# Patient Record
Sex: Female | Born: 1950 | Race: Black or African American | Hispanic: No | Marital: Married | Smoking: Former smoker
Health system: Southern US, Community
[De-identification: ages and names within clinical notes are randomized; demographics above are authoritative.]

## PROBLEM LIST (undated history)

## (undated) DIAGNOSIS — IMO0002 Reserved for concepts with insufficient information to code with codable children: Secondary | ICD-10-CM

## (undated) DIAGNOSIS — J302 Other seasonal allergic rhinitis: Secondary | ICD-10-CM

## (undated) DIAGNOSIS — I10 Essential (primary) hypertension: Secondary | ICD-10-CM

## (undated) DIAGNOSIS — Z5189 Encounter for other specified aftercare: Secondary | ICD-10-CM

## (undated) DIAGNOSIS — F419 Anxiety disorder, unspecified: Secondary | ICD-10-CM

## (undated) DIAGNOSIS — C801 Malignant (primary) neoplasm, unspecified: Secondary | ICD-10-CM

## (undated) DIAGNOSIS — O021 Missed abortion: Secondary | ICD-10-CM

## (undated) HISTORY — PX: OTHER SURGICAL HISTORY: SHX169

## (undated) HISTORY — PX: WISDOM TOOTH EXTRACTION: SHX21

## (undated) HISTORY — DX: Malignant (primary) neoplasm, unspecified: C80.1

## (undated) HISTORY — PX: CHOLECYSTECTOMY: SHX55

## (undated) HISTORY — PX: BREAST SURGERY: SHX581

## (undated) HISTORY — PX: DILATION AND CURETTAGE OF UTERUS: SHX78

---

## 1977-10-30 DIAGNOSIS — IMO0001 Reserved for inherently not codable concepts without codable children: Secondary | ICD-10-CM

## 1977-10-30 DIAGNOSIS — Z5189 Encounter for other specified aftercare: Secondary | ICD-10-CM

## 1977-10-30 HISTORY — DX: Reserved for inherently not codable concepts without codable children: IMO0001

## 1977-10-30 HISTORY — DX: Encounter for other specified aftercare: Z51.89

## 1997-12-03 ENCOUNTER — Ambulatory Visit (HOSPITAL_COMMUNITY): Admission: RE | Admit: 1997-12-03 | Discharge: 1997-12-03 | Payer: Self-pay | Admitting: Obstetrics and Gynecology

## 1998-12-29 ENCOUNTER — Ambulatory Visit (HOSPITAL_COMMUNITY): Admission: RE | Admit: 1998-12-29 | Discharge: 1998-12-29 | Payer: Self-pay | Admitting: Obstetrics and Gynecology

## 1998-12-29 ENCOUNTER — Encounter: Payer: Self-pay | Admitting: Obstetrics and Gynecology

## 1999-02-24 ENCOUNTER — Other Ambulatory Visit: Admission: RE | Admit: 1999-02-24 | Discharge: 1999-02-24 | Payer: Self-pay | Admitting: Obstetrics and Gynecology

## 2000-01-02 ENCOUNTER — Encounter: Payer: Self-pay | Admitting: Obstetrics and Gynecology

## 2000-01-02 ENCOUNTER — Ambulatory Visit (HOSPITAL_COMMUNITY): Admission: RE | Admit: 2000-01-02 | Discharge: 2000-01-02 | Payer: Self-pay | Admitting: Obstetrics and Gynecology

## 2000-02-28 ENCOUNTER — Other Ambulatory Visit: Admission: RE | Admit: 2000-02-28 | Discharge: 2000-02-28 | Payer: Self-pay | Admitting: Obstetrics and Gynecology

## 2001-01-24 ENCOUNTER — Ambulatory Visit (HOSPITAL_COMMUNITY): Admission: RE | Admit: 2001-01-24 | Discharge: 2001-01-24 | Payer: Self-pay | Admitting: Obstetrics and Gynecology

## 2001-01-24 ENCOUNTER — Encounter: Payer: Self-pay | Admitting: Obstetrics and Gynecology

## 2001-03-01 ENCOUNTER — Other Ambulatory Visit: Admission: RE | Admit: 2001-03-01 | Discharge: 2001-03-01 | Payer: Self-pay | Admitting: Obstetrics and Gynecology

## 2002-01-31 ENCOUNTER — Ambulatory Visit (HOSPITAL_COMMUNITY): Admission: RE | Admit: 2002-01-31 | Discharge: 2002-01-31 | Payer: Self-pay | Admitting: Gynecology

## 2002-01-31 ENCOUNTER — Encounter: Payer: Self-pay | Admitting: Obstetrics and Gynecology

## 2002-03-11 ENCOUNTER — Other Ambulatory Visit: Admission: RE | Admit: 2002-03-11 | Discharge: 2002-03-11 | Payer: Self-pay | Admitting: Obstetrics and Gynecology

## 2003-04-17 ENCOUNTER — Encounter: Payer: Self-pay | Admitting: Obstetrics and Gynecology

## 2003-04-17 ENCOUNTER — Encounter: Admission: RE | Admit: 2003-04-17 | Discharge: 2003-04-17 | Payer: Self-pay | Admitting: Obstetrics and Gynecology

## 2003-12-19 ENCOUNTER — Encounter: Admission: RE | Admit: 2003-12-19 | Discharge: 2003-12-19 | Payer: Self-pay | Admitting: Orthopedic Surgery

## 2004-07-08 ENCOUNTER — Ambulatory Visit (HOSPITAL_COMMUNITY): Admission: RE | Admit: 2004-07-08 | Discharge: 2004-07-08 | Payer: Self-pay | Admitting: Obstetrics and Gynecology

## 2005-08-22 ENCOUNTER — Ambulatory Visit (HOSPITAL_COMMUNITY): Admission: RE | Admit: 2005-08-22 | Discharge: 2005-08-22 | Payer: Self-pay | Admitting: Obstetrics and Gynecology

## 2006-08-23 ENCOUNTER — Ambulatory Visit (HOSPITAL_COMMUNITY): Admission: RE | Admit: 2006-08-23 | Discharge: 2006-08-23 | Payer: Self-pay | Admitting: Obstetrics & Gynecology

## 2007-08-27 ENCOUNTER — Ambulatory Visit (HOSPITAL_COMMUNITY): Admission: RE | Admit: 2007-08-27 | Discharge: 2007-08-27 | Payer: Self-pay | Admitting: Obstetrics & Gynecology

## 2007-09-05 ENCOUNTER — Encounter: Admission: RE | Admit: 2007-09-05 | Discharge: 2007-09-05 | Payer: Self-pay | Admitting: Obstetrics & Gynecology

## 2007-09-12 ENCOUNTER — Encounter (INDEPENDENT_AMBULATORY_CARE_PROVIDER_SITE_OTHER): Payer: Self-pay | Admitting: Obstetrics & Gynecology

## 2007-09-12 ENCOUNTER — Encounter: Admission: RE | Admit: 2007-09-12 | Discharge: 2007-09-12 | Payer: Self-pay | Admitting: Obstetrics & Gynecology

## 2007-09-12 ENCOUNTER — Encounter (INDEPENDENT_AMBULATORY_CARE_PROVIDER_SITE_OTHER): Payer: Self-pay | Admitting: Radiology

## 2007-09-20 ENCOUNTER — Encounter: Admission: RE | Admit: 2007-09-20 | Discharge: 2007-09-20 | Payer: Self-pay | Admitting: Obstetrics & Gynecology

## 2007-10-02 ENCOUNTER — Encounter: Admission: RE | Admit: 2007-10-02 | Discharge: 2007-10-02 | Payer: Self-pay | Admitting: General Surgery

## 2007-10-03 ENCOUNTER — Encounter: Admission: RE | Admit: 2007-10-03 | Discharge: 2007-10-03 | Payer: Self-pay | Admitting: General Surgery

## 2007-10-03 ENCOUNTER — Encounter (INDEPENDENT_AMBULATORY_CARE_PROVIDER_SITE_OTHER): Payer: Self-pay | Admitting: General Surgery

## 2007-10-03 ENCOUNTER — Ambulatory Visit (HOSPITAL_BASED_OUTPATIENT_CLINIC_OR_DEPARTMENT_OTHER): Admission: RE | Admit: 2007-10-03 | Discharge: 2007-10-03 | Payer: Self-pay | Admitting: General Surgery

## 2007-11-06 ENCOUNTER — Encounter (INDEPENDENT_AMBULATORY_CARE_PROVIDER_SITE_OTHER): Payer: Self-pay | Admitting: General Surgery

## 2007-11-06 ENCOUNTER — Ambulatory Visit (HOSPITAL_BASED_OUTPATIENT_CLINIC_OR_DEPARTMENT_OTHER): Admission: RE | Admit: 2007-11-06 | Discharge: 2007-11-06 | Payer: Self-pay | Admitting: General Surgery

## 2007-11-13 ENCOUNTER — Ambulatory Visit: Payer: Self-pay | Admitting: Oncology

## 2007-11-22 ENCOUNTER — Ambulatory Visit: Admission: RE | Admit: 2007-11-22 | Discharge: 2008-02-13 | Payer: Self-pay | Admitting: Radiation Oncology

## 2007-11-27 ENCOUNTER — Encounter: Admission: RE | Admit: 2007-11-27 | Discharge: 2007-11-27 | Payer: Self-pay | Admitting: Radiation Oncology

## 2007-12-02 LAB — COMPREHENSIVE METABOLIC PANEL
ALT: 22 U/L (ref 0–35)
AST: 16 U/L (ref 0–37)
Calcium: 9.4 mg/dL (ref 8.4–10.5)
Chloride: 102 mEq/L (ref 96–112)
Creatinine, Ser: 0.71 mg/dL (ref 0.40–1.20)
Sodium: 142 mEq/L (ref 135–145)
Total Bilirubin: 0.4 mg/dL (ref 0.3–1.2)

## 2007-12-02 LAB — CBC WITH DIFFERENTIAL/PLATELET
BASO%: 0.6 % (ref 0.0–2.0)
EOS%: 13.8 % — ABNORMAL HIGH (ref 0.0–7.0)
HCT: 38.1 % (ref 34.8–46.6)
MCH: 26.6 pg (ref 26.0–34.0)
MCHC: 33.6 g/dL (ref 32.0–36.0)
NEUT%: 33 % — ABNORMAL LOW (ref 39.6–76.8)
RBC: 4.8 10*6/uL (ref 3.70–5.32)
RDW: 14.3 % (ref 11.3–14.5)
lymph#: 3.3 10*3/uL (ref 0.9–3.3)

## 2008-01-24 ENCOUNTER — Ambulatory Visit: Payer: Self-pay | Admitting: Oncology

## 2008-05-25 IMAGING — CR DG CHEST 2V
2 series · 2 of 2 positions shown · non-contrast
Comparison: None.

CLINICAL DATA: Preop respiratory exam for left breast surgery.
 CHEST - 2 VIEWS:

[w chest pa]
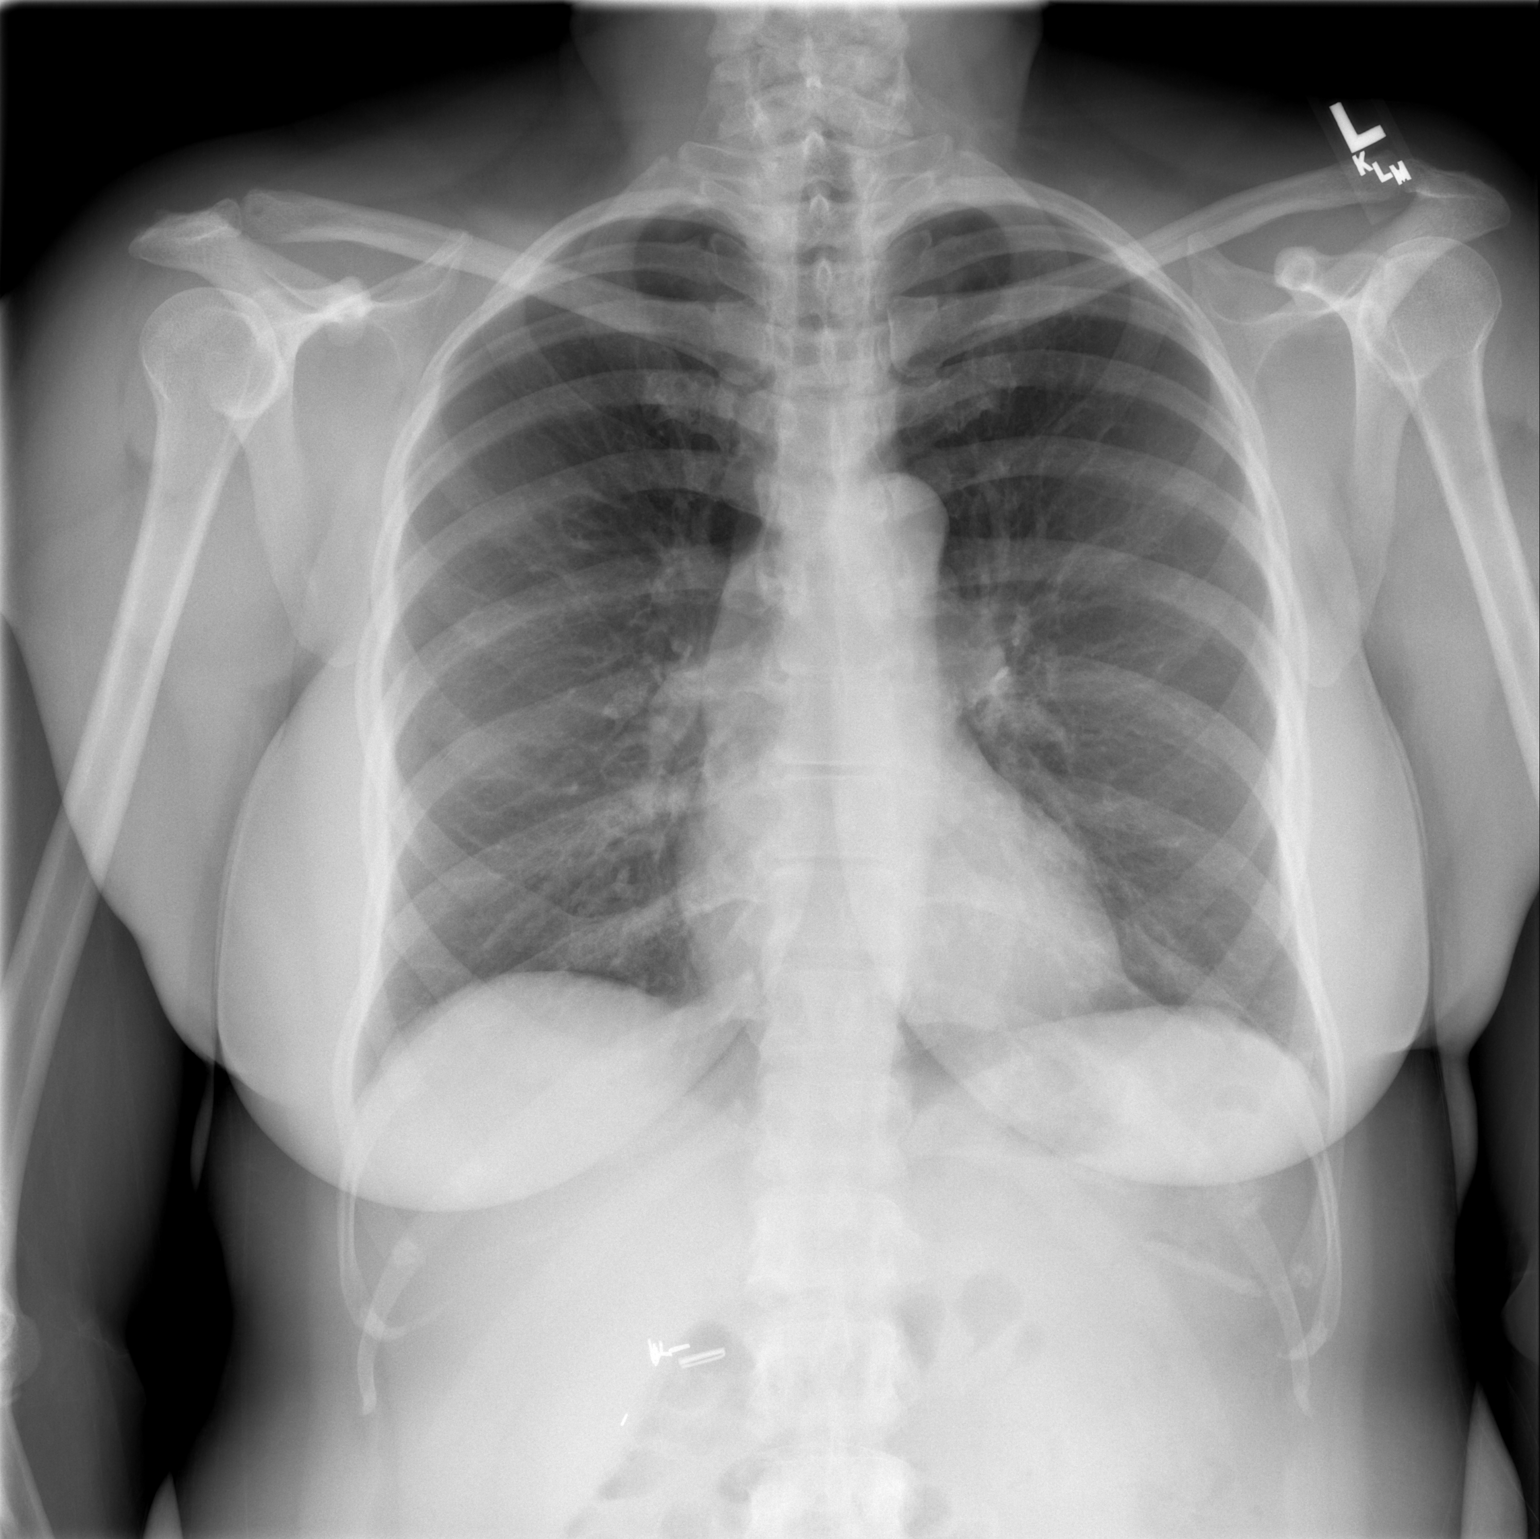

[w chest lat]
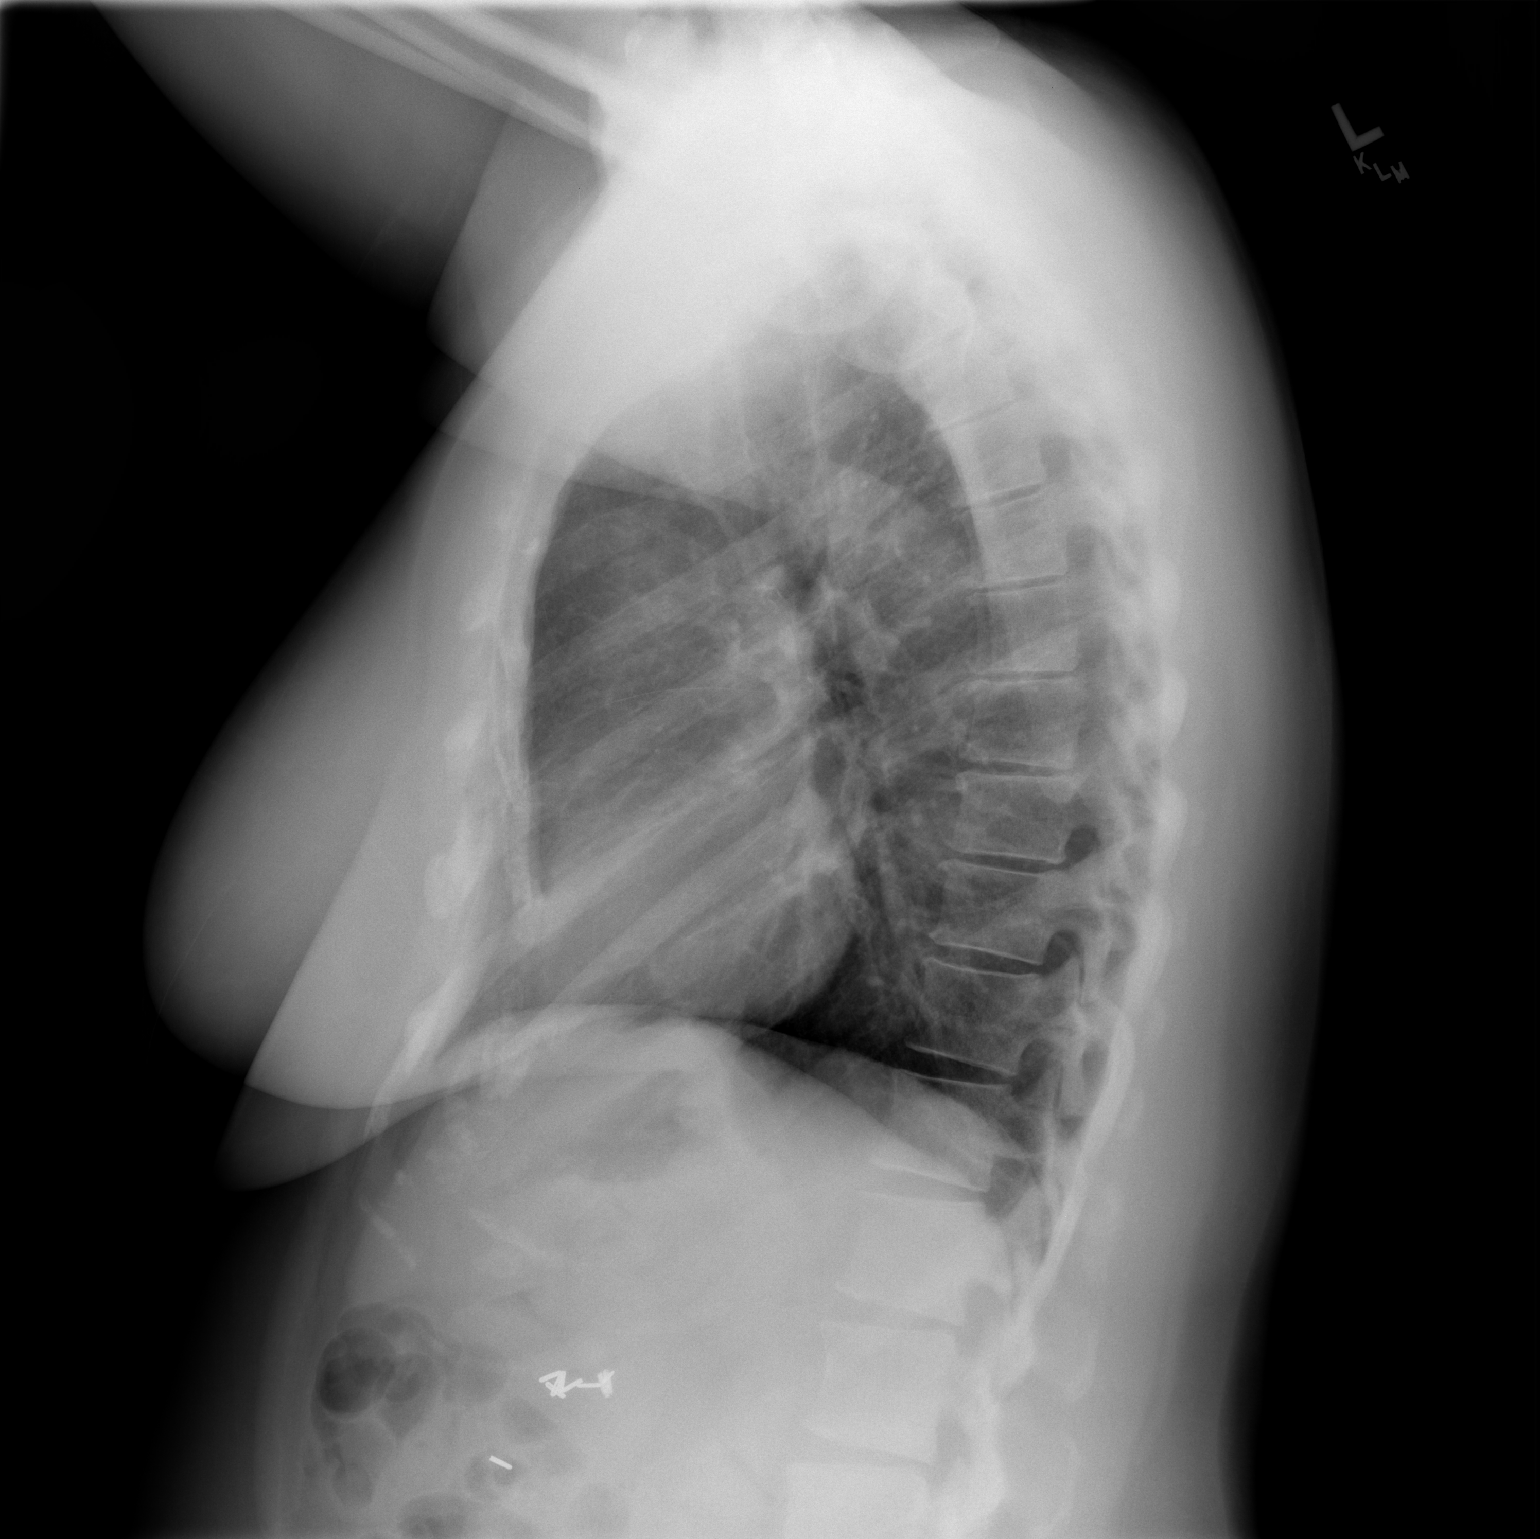

[2 of 2 positions shown; findings below may reference images not displayed]

FINDINGS: Two views of the chest show the lungs to be clear. The heart is within normal limits in size.  Surgical clips are present in the right upper quadrant.
IMPRESSION: No active lung disease.

## 2008-06-05 ENCOUNTER — Ambulatory Visit: Payer: Self-pay | Admitting: Oncology

## 2008-06-09 LAB — CBC WITH DIFFERENTIAL/PLATELET
BASO%: 0.7 % (ref 0.0–2.0)
EOS%: 9.6 % — ABNORMAL HIGH (ref 0.0–7.0)
HCT: 39.8 % (ref 34.8–46.6)
LYMPH%: 25.7 % (ref 14.0–48.0)
MCH: 26.8 pg (ref 26.0–34.0)
MCHC: 33.2 g/dL (ref 32.0–36.0)
MCV: 80.7 fL — ABNORMAL LOW (ref 81.0–101.0)
MONO%: 12.9 % (ref 0.0–13.0)
NEUT%: 51.1 % (ref 39.6–76.8)
Platelets: 243 10*3/uL (ref 145–400)

## 2008-09-07 ENCOUNTER — Encounter: Admission: RE | Admit: 2008-09-07 | Discharge: 2008-09-07 | Payer: Self-pay | Admitting: Oncology

## 2008-12-30 ENCOUNTER — Ambulatory Visit: Payer: Self-pay | Admitting: Oncology

## 2009-01-01 LAB — COMPREHENSIVE METABOLIC PANEL
Albumin: 3.5 g/dL (ref 3.5–5.2)
BUN: 11 mg/dL (ref 6–23)
CO2: 29 mEq/L (ref 19–32)
Calcium: 9.3 mg/dL (ref 8.4–10.5)
Chloride: 107 mEq/L (ref 96–112)
Creatinine, Ser: 0.84 mg/dL (ref 0.40–1.20)
Glucose, Bld: 166 mg/dL — ABNORMAL HIGH (ref 70–99)
Potassium: 3.7 mEq/L (ref 3.5–5.3)

## 2009-01-01 LAB — CBC WITH DIFFERENTIAL/PLATELET
Eosinophils Absolute: 0.8 10*3/uL — ABNORMAL HIGH (ref 0.0–0.5)
HCT: 37.3 % (ref 34.8–46.6)
LYMPH%: 37.1 % (ref 14.0–49.7)
MCV: 76.9 fL — ABNORMAL LOW (ref 79.5–101.0)
MONO#: 0.4 10*3/uL (ref 0.1–0.9)
MONO%: 7.6 % (ref 0.0–14.0)
NEUT#: 2.3 10*3/uL (ref 1.5–6.5)
NEUT%: 40.4 % (ref 38.4–76.8)
Platelets: 240 10*3/uL (ref 145–400)
RBC: 4.85 10*6/uL (ref 3.70–5.45)
WBC: 5.6 10*3/uL (ref 3.9–10.3)
nRBC: 0 % (ref 0–0)

## 2009-07-09 ENCOUNTER — Ambulatory Visit: Payer: Self-pay | Admitting: Oncology

## 2009-07-13 LAB — CBC WITH DIFFERENTIAL/PLATELET
Basophils Absolute: 0 10*3/uL (ref 0.0–0.1)
EOS%: 9.2 % — ABNORMAL HIGH (ref 0.0–7.0)
Eosinophils Absolute: 0.6 10*3/uL — ABNORMAL HIGH (ref 0.0–0.5)
HCT: 36.7 % (ref 34.8–46.6)
HGB: 12.5 g/dL (ref 11.6–15.9)
MCH: 26.7 pg (ref 25.1–34.0)
MCV: 78.4 fL — ABNORMAL LOW (ref 79.5–101.0)
MONO%: 8.1 % (ref 0.0–14.0)
NEUT#: 3.1 10*3/uL (ref 1.5–6.5)
NEUT%: 46.2 % (ref 38.4–76.8)
lymph#: 2.5 10*3/uL (ref 0.9–3.3)

## 2009-09-10 ENCOUNTER — Encounter: Admission: RE | Admit: 2009-09-10 | Discharge: 2009-09-10 | Payer: Self-pay | Admitting: Oncology

## 2010-01-07 ENCOUNTER — Ambulatory Visit: Payer: Self-pay | Admitting: Oncology

## 2010-07-11 ENCOUNTER — Ambulatory Visit: Payer: Self-pay | Admitting: Oncology

## 2010-09-06 ENCOUNTER — Ambulatory Visit: Payer: Self-pay | Admitting: Oncology

## 2010-09-08 LAB — CBC WITH DIFFERENTIAL/PLATELET
Eosinophils Absolute: 0.9 10*3/uL — ABNORMAL HIGH (ref 0.0–0.5)
HCT: 37.5 % (ref 34.8–46.6)
LYMPH%: 44.5 % (ref 14.0–49.7)
MONO#: 0.7 10*3/uL (ref 0.1–0.9)
NEUT#: 1.8 10*3/uL (ref 1.5–6.5)
NEUT%: 29 % — ABNORMAL LOW (ref 38.4–76.8)
Platelets: 257 10*3/uL (ref 145–400)
WBC: 6.2 10*3/uL (ref 3.9–10.3)

## 2010-09-09 LAB — COMPREHENSIVE METABOLIC PANEL
CO2: 28 mEq/L (ref 19–32)
Creatinine, Ser: 1 mg/dL (ref 0.40–1.20)
Glucose, Bld: 81 mg/dL (ref 70–99)
Total Bilirubin: 0.4 mg/dL (ref 0.3–1.2)
Total Protein: 7 g/dL (ref 6.0–8.3)

## 2010-09-09 LAB — VITAMIN D 25 HYDROXY (VIT D DEFICIENCY, FRACTURES): Vit D, 25-Hydroxy: 51 ng/mL (ref 30–89)

## 2010-09-09 LAB — LACTATE DEHYDROGENASE: LDH: 167 U/L (ref 94–250)

## 2010-09-12 ENCOUNTER — Encounter: Admission: RE | Admit: 2010-09-12 | Discharge: 2010-09-12 | Payer: Self-pay | Admitting: Oncology

## 2010-11-19 ENCOUNTER — Other Ambulatory Visit: Payer: Self-pay | Admitting: Oncology

## 2010-11-19 DIAGNOSIS — Z9889 Other specified postprocedural states: Secondary | ICD-10-CM

## 2010-11-20 ENCOUNTER — Encounter: Payer: Self-pay | Admitting: Obstetrics and Gynecology

## 2010-11-20 ENCOUNTER — Encounter: Payer: Self-pay | Admitting: Obstetrics & Gynecology

## 2011-03-14 NOTE — Op Note (Signed)
NAMEKRISTIN, [name]          ACCOUNT NO.:  [id number]   MEDICAL RECORD NO.:  [id number]          PATIENT TYPE:  AMB   LOCATION:  DSC                          FACILITY:  [facility]   PHYSICIAN:  Timothy E. Earlene Plater, M.D. DATE OF BIRTH:  1950-11-03   DATE OF PROCEDURE:  DATE OF DISCHARGE:                               OPERATIVE REPORT   PREOPERATIVE DIAGNOSIS:  Ductal carcinoma in situ left breast.   POSTOPERATIVE DIAGNOSIS:  Ductal carcinoma in situ left breast.   PROCEDURES:  Needle-localized wide excision area of DCIS left breast.   SURGEON:  Kendrick Ranch   ANESTHESIA:  Local standby.   Toni Williams is healthy, 64.  Mammograms reveal tiny area of  calcifications.  Stereotactic biopsies were done showing DCIS and  multiple calcifications had been removed.  She was informed and  scheduled for a needle-localized complete excision; she agrees and  understands.  She was seen identified today and the left breast marked.  She was taken to the operating room, IV sedation given.  The left breast  was exposed, the wire was cut to length, breast was prepped and draped  in the usual fashion.  Marcaine 1/4% with epinephrine was used  throughout for local anesthesia.  The wire entered the breast medially  and the wire pierced the area of calcification and clip, an incision was  made at the wire entrance, the section was carried down around the wire  with a generous margin, the only palpable abnormal tissue was at the tip  of the wire which was also excised.  So, the tissue around the wire were  placed in the canister for mammography and the additional tissue  removed, both from around the wire and around the tip of the wire were  also placed in that container.  That was sent for specimen mammogram.  Meanwhile, bleeding was controlled with a cautery and the wound was  closed in layers with Steri-Strips.  X-ray called back and said that the  wire, the clip and the calcifications were included.   Counts correct,  she tolerated it well, she was removed to recovery room in good  condition.  Written and verbal instructions given and she will be seen  and followed as an outpatient.      Timothy E. Earlene Plater, M.D.  Electronically Signed     TED/MEDQ  D:  10/03/2007  T:  10/03/2007  Job:  [id number]   cc:   [facility]

## 2011-03-14 NOTE — Op Note (Signed)
NAMEKRYSTAL, TEACHEY          ACCOUNT NO.:  [id number]   MEDICAL RECORD NO.:  [id number]           PATIENT TYPE:   LOCATION:                                 FACILITY:   PHYSICIAN:  Timothy E. Earlene Plater, M.D. DATE OF BIRTH:  1951-05-15   DATE OF PROCEDURE:  11/06/2007  DATE OF DISCHARGE:                               OPERATIVE REPORT   PREOP DIAGNOSIS:  Ductal carcinoma in situ left breast with positive  margins.   POSTOPERATIVE DIAGNOSIS:  Ductal carcinoma in situ left breast with  positive margins.   PROCEDURE:  Re-excision of area of left breast.   SURGEON:  Timothy E. Earlene Plater, M.D.   ANESTHESIA:  Local standby.   HISTORY:  Ms. Sandra is an otherwise healthy 60 year old, had an  abnormal mammogram, core biopsy, DCIS, and a needle directed excisional  biopsy done in mid-November.  The margins were positive.  She has been  carefully counseled, and has picked, at this time, to proceed with  reexcision.  The patient was informed; she signed the permit, and the  left breast was marked.   She was taken to the operating room, placed supine, IV sedation given.  The left breast was prepped and draped in the usual fashion.  Marcaine  1/4% with epinephrine was used throughout for a wide local excision.  The skin was marked over the previous scar, and this scar was sharply  excised.  Beginning in the superficial subcutaneous fat, the entire area  of the prior biopsy was completely removed.  It was carefully marked  with sutures, and as well a wedge of skin on the obvious anterior  margin.  This was quite a large excision, much larger than the original.  No other areas within the cavity were thought to be abnormal.  cautery  was used to control the bleeding, and the wound was dry.   It was approximated with 3-0 Vicryl and 3-0 Monocryl.  Steri-Strips were  applied.  Final counts were correct.  The specimen was sent fresh to lab  to assure proper alignment.  She was removed to the  recovery room in  good condition.  Vicodin #36 given.  She will be seen and followed as an  outpatient.      Timothy E. Earlene Plater, M.D.  Electronically Signed     TED/MEDQ  D:  11/06/2007  T:  11/06/2007  Job:  [id number]

## 2011-04-03 ENCOUNTER — Ambulatory Visit (INDEPENDENT_AMBULATORY_CARE_PROVIDER_SITE_OTHER): Payer: BC Managed Care – PPO | Admitting: Family Medicine

## 2011-04-03 ENCOUNTER — Encounter: Payer: Self-pay | Admitting: Family Medicine

## 2011-04-03 DIAGNOSIS — I10 Essential (primary) hypertension: Secondary | ICD-10-CM

## 2011-04-03 NOTE — Progress Notes (Signed)
Medical Nutrition Therapy:  Appt start time: 1130 end time: 1230.  Assessment:  Primary concerns today: Weight management and hypertension, referred by Dr. Elvina Sidle.  Usual eating pattern includes 3 meals and 1-2 snacks per day.  Everyday foods include decaf coffee w/ flavored nondairy creamer and 1 c fruit juice.  Avoided foods include soda.  24-hr recall suggests an intake of 1800-1900 kcal: B (8:30 AM)- 1 c Special K w/ berries w/ 1/2 c 2% milk; L (1 PM)- [employer]: 2 [place] sausage patties, 2 scrmbled eggs, 1 c hash browns, 2 slc toast w/ butter & jam, 2 c decaf coffee w/ total of 3 tsp sugar; D (7 PM)- 1 fried pork chop, 3/4 c mixed veg's, 3/4 c rice, 1 c juice.  A more typical lunch is a sandwich.  Usual physical activity includes none.  She hopes to start Zumba on tapes at home.  Ms. Stanfill works at [employer], which is a large building that would accommodate walking for exercise.  Walking outside is not an option b/c of allergies.    Progress Towards Goal(s):  In progress.   Nutritional Diagnosis:  NB-2.1 Physical inactivity As related to poor motivation.  As evidenced by no current exercise. NI-5.8.3 Inappropriate intake of types of carbohydrates (specify):  As related to fruits and vegetables.  As evidenced by only one serving of veg's on most days, and fruit often limited to juice.    Intervention:  Nutrition education.  Monitoring/Evaluation:  Dietary intake, exercise, and body weight in 4 week(s).

## 2011-04-03 NOTE — Patient Instructions (Addendum)
-   Physical activity goal:  At least 30 min 4 X wk of Zumba at home or walking.  (Consider walking some days at work.) - At work or at any task where you are seated for a long time, make an effort to move around as much as you can.  Google "Non-exercise activity thermogenesis" (NEAT) and Georgeann Oppenheim, MD.   - Obtain twice as many veg's as protein or carbohydrate foods for both lunch and dinner.  Vegetables are crucial to wt management AND to controlling blood pressure AND to appetite management. - Breakfast:  Try some high-fiber cereals (more than 5 grams per serving, i.e., Shredded Wheat 'n Bran, any of the bran cereals, Frosted Mini-wheats, Wheat Chex), and see if they help you get thru the morning without a snack.   - Snacks:  Fruit, string cheese, small handful of unsalted nuts/seeds. - PLANNING AHEAD will make the difference of these things happening or not.     - When choosing processed carbohydrate foods (breads, any baked foods, crackers, bagels, pretzels), choose those with the most fiber (and less sugar).  - Check with BCBS:  Ask if follow-up appts are covered for MEDICAL NUTRITION THERAPY for hypertension.  If you want to schedule another appt, call Dr. Gerilyn Pilgrim at [phone].

## 2011-05-18 ENCOUNTER — Other Ambulatory Visit: Payer: Self-pay | Admitting: Obstetrics and Gynecology

## 2011-05-26 ENCOUNTER — Other Ambulatory Visit: Payer: Self-pay | Admitting: Obstetrics and Gynecology

## 2011-05-29 ENCOUNTER — Other Ambulatory Visit: Payer: Self-pay | Admitting: Obstetrics and Gynecology

## 2011-07-20 LAB — BASIC METABOLIC PANEL
BUN: 10
CO2: 29
Chloride: 99
Creatinine, Ser: 0.81
Glucose, Bld: 89

## 2011-08-07 LAB — COMPREHENSIVE METABOLIC PANEL
AST: 21
Albumin: 3.7
CO2: 31
Calcium: 9.6
Creatinine, Ser: 0.71
GFR calc Af Amer: >60
GFR calc non Af Amer: >60

## 2011-08-07 LAB — CBC
MCHC: 33.2
MCV: 79.6
Platelets: 310

## 2011-08-07 LAB — DIFFERENTIAL
Eosinophils Relative: 10 — ABNORMAL HIGH
Lymphocytes Relative: 38
Lymphs Abs: 2.6

## 2011-09-11 ENCOUNTER — Other Ambulatory Visit: Payer: BC Managed Care – PPO | Admitting: Lab

## 2011-09-13 ENCOUNTER — Telehealth: Payer: Self-pay | Admitting: *Deleted

## 2011-09-13 NOTE — Telephone Encounter (Signed)
RESCHEDULED PATIENT FOR MISSED LAB APPOINTMENT FOR 09-14-2011 AT 8:00AM

## 2011-09-14 ENCOUNTER — Other Ambulatory Visit: Payer: Self-pay | Admitting: Oncology

## 2011-09-14 ENCOUNTER — Other Ambulatory Visit (HOSPITAL_BASED_OUTPATIENT_CLINIC_OR_DEPARTMENT_OTHER): Payer: BC Managed Care – PPO | Admitting: Lab

## 2011-09-14 DIAGNOSIS — Z170 Estrogen receptor positive status [ER+]: Secondary | ICD-10-CM

## 2011-09-14 DIAGNOSIS — D0590 Unspecified type of carcinoma in situ of unspecified breast: Secondary | ICD-10-CM

## 2011-09-14 LAB — CBC WITH DIFFERENTIAL/PLATELET
Basophils Absolute: 0 10*3/uL (ref 0.0–0.1)
Eosinophils Absolute: 0.8 10*3/uL — ABNORMAL HIGH (ref 0.0–0.5)
HCT: 37.5 % (ref 34.8–46.6)
HGB: 12.7 g/dL (ref 11.6–15.9)
LYMPH%: 48 % (ref 14.0–49.7)
MCHC: 33.9 g/dL (ref 31.5–36.0)
MONO#: 0.6 10*3/uL (ref 0.1–0.9)
NEUT#: 1.9 10*3/uL (ref 1.5–6.5)
NEUT%: 29.6 % — ABNORMAL LOW (ref 38.4–76.8)
Platelets: 260 10*3/uL (ref 145–400)
WBC: 6.3 10*3/uL (ref 3.9–10.3)
lymph#: 3 10*3/uL (ref 0.9–3.3)

## 2011-09-14 LAB — COMPREHENSIVE METABOLIC PANEL
ALT: 32 U/L (ref 0–35)
BUN: 11 mg/dL (ref 6–23)
CO2: 24 mEq/L (ref 19–32)
Calcium: 9.6 mg/dL (ref 8.4–10.5)
Chloride: 107 mEq/L (ref 96–112)
Creatinine, Ser: 0.74 mg/dL (ref 0.50–1.10)
Glucose, Bld: 105 mg/dL — ABNORMAL HIGH (ref 70–99)
Total Bilirubin: 0.4 mg/dL (ref 0.3–1.2)

## 2011-09-14 LAB — VITAMIN D 25 HYDROXY (VIT D DEFICIENCY, FRACTURES): Vit D, 25-Hydroxy: 40 ng/mL (ref 30–89)

## 2011-09-19 ENCOUNTER — Telehealth: Payer: Self-pay | Admitting: Oncology

## 2011-09-19 ENCOUNTER — Ambulatory Visit (HOSPITAL_BASED_OUTPATIENT_CLINIC_OR_DEPARTMENT_OTHER): Payer: BC Managed Care – PPO | Admitting: Oncology

## 2011-09-19 DIAGNOSIS — Z170 Estrogen receptor positive status [ER+]: Secondary | ICD-10-CM

## 2011-09-19 DIAGNOSIS — D0510 Intraductal carcinoma in situ of unspecified breast: Secondary | ICD-10-CM

## 2011-09-19 DIAGNOSIS — D0590 Unspecified type of carcinoma in situ of unspecified breast: Secondary | ICD-10-CM

## 2011-09-19 DIAGNOSIS — N840 Polyp of corpus uteri: Secondary | ICD-10-CM

## 2011-09-19 DIAGNOSIS — E559 Vitamin D deficiency, unspecified: Secondary | ICD-10-CM

## 2011-09-19 NOTE — Progress Notes (Signed)
Hematology and Oncology Follow Up Visit  Toni Williams [id number] August 28, 1951 60 y.o. 09/19/2011 10:54 AM   Principle Diagnosis: DCIS status post lumpectomy 10/01/2007, status post radiation therapy completed 02/03/2008, ER positive PR positive on tamoxifen here for one-year followup visit  Interim History:  The patient is doing without complaint. She continues on tamoxifen. She did have recent a  biopsy of her uterine lining which was negative though a polyp was seen and she is therefore due for a D+C.  Medications: I have reviewed the patient's current medications.  Allergies: No Known Allergies  Past Medical History, Surgical history, Social history, and Family History were reviewed and updated.  Review of Systems: Constitutional:  Negative for fever, chills, night sweats, anorexia, weight loss, pain. Cardiovascular: no chest pain or dyspnea on exertion Respiratory: no cough, shortness of breath, or wheezing Neurological: no TIA or stroke symptoms Dermatological: negative ENT: negative Skin Gastrointestinal: no abdominal pain, change in bowel habits, or black or bloody stools Genito-Urinary: no dysuria, trouble voiding, or hematuria Hematological and Lymphatic: negative Breast: negative for breast lumps Musculoskeletal: negative Remaining ROS negative.  Physical Exam: Blood pressure 137/83, pulse 123, temperature 99 F (37.2 C), temperature source Oral, height 5\' 4"  (1.626 m), weight 193 lb 11.2 oz (87.862 kg). ECOG: 0 General appearance: alert, cooperative and appears stated age Head: Normocephalic, without obvious abnormality, atraumatic Neck: no adenopathy, no carotid bruit, no JVD, supple, symmetrical, trachea midline and thyroid not enlarged, symmetric, no tenderness/mass/nodules Lymph nodes: Cervical, supraclavicular, and axillary nodes normal. Cardiac : Normal Pulmonary: Normal Breasts: Bilateral breast exam without evidence of mass, skin change or nipple  retraction, both axilla negative Abdomen: Normal Extremities normal Neuro: Normal  Lab Results: Lab Results  Component Value Date   WBC 6.3 09/14/2011   HGB 12.7 09/14/2011   HCT 37.5 09/14/2011   MCV 78.3* 09/14/2011   PLT 260 09/14/2011     Chemistry      Component Value Date/Time   NA 142 09/14/2011 0805   NA 142 09/14/2011 0805   K 3.9 09/14/2011 0805   K 3.9 09/14/2011 0805   CL 107 09/14/2011 0805   CL 107 09/14/2011 0805   CO2 24 09/14/2011 0805   CO2 24 09/14/2011 0805   BUN 11 09/14/2011 0805   BUN 11 09/14/2011 0805   CREATININE 0.74 09/14/2011 0805   CREATININE 0.74 09/14/2011 0805      Component Value Date/Time   CALCIUM 9.6 09/14/2011 0805   CALCIUM 9.6 09/14/2011 0805   ALKPHOS 48 09/14/2011 0805   ALKPHOS 48 09/14/2011 0805   AST 18 09/14/2011 0805   AST 18 09/14/2011 0805   ALT 32 09/14/2011 0805   ALT 32 09/14/2011 0805   BILITOT 0.4 09/14/2011 0805   BILITOT 0.4 09/14/2011 0805       Radiological Studies: chest X-ray n/a Mammogram Done 11/20- read as normal Bone density n/a  Impression and Plan: History of DCIS with followup radiation completed 3-1/2 years ago tolerating tamoxifen. Due for the D. and C.. we'll see Toni Williams in 1 years time with mammogram and bone density test.  More than 50% of the visit was spent in patient-related counselling   Pierce Crane, MD 11/20/201210:54 AM

## 2011-09-19 NOTE — Telephone Encounter (Signed)
Gv pt appt for nov2013.  scheduled pt for mammogram and bone density for 09/19/2012 @ 9am @ [facility]

## 2011-10-13 ENCOUNTER — Encounter (HOSPITAL_COMMUNITY): Payer: Self-pay | Admitting: Pharmacist

## 2011-10-17 NOTE — Patient Instructions (Addendum)
   Your procedure is scheduled on: Friday, Dec 28th  Enter through the Main Entrance of [facility] at: 8:00am Pick up the phone at the desk and dial [phone] and inform us of your arrival.  Please call this number if you have any problems the morning of surgery: [phone]  Remember: Do not eat food after midnight: Thursday Do not drink clear liquids after: Thursday Take these medicines the morning of surgery with a SIP OF WATER:  Blood pressure med   Do not wear jewelry, make-up, or FINGER nail polish Do not wear lotions, powders, perfumes or deodorant. Do not shave 48 hours prior to surgery. Do not bring valuables to the hospital.  Patients discharged on the day of surgery will not be allowed to drive home.   Home with Husband Loraine Leriche  cell [phone]  Remember to use your hibiclens as instructed.Please shower with 1/2 bottle the evening before your surgery and the other 1/2 bottle the morning of surgery.

## 2011-10-18 ENCOUNTER — Other Ambulatory Visit: Payer: Self-pay

## 2011-10-18 ENCOUNTER — Encounter (HOSPITAL_COMMUNITY)
Admission: RE | Admit: 2011-10-18 | Discharge: 2011-10-18 | Disposition: A | Discharge status: Home / Self Care | Payer: BC Managed Care – PPO | Source: Ambulatory Visit | Attending: Obstetrics and Gynecology | Admitting: Obstetrics and Gynecology

## 2011-10-18 ENCOUNTER — Encounter (HOSPITAL_COMMUNITY): Payer: Self-pay

## 2011-10-18 HISTORY — DX: Reserved for concepts with insufficient information to code with codable children: IMO0002

## 2011-10-18 HISTORY — DX: Encounter for other specified aftercare: Z51.89

## 2011-10-18 HISTORY — DX: Anxiety disorder, unspecified: F41.9

## 2011-10-18 HISTORY — DX: Other seasonal allergic rhinitis: J30.2

## 2011-10-18 HISTORY — DX: Essential (primary) hypertension: I10

## 2011-10-18 HISTORY — DX: Missed abortion: O02.1

## 2011-10-18 LAB — CBC
MCH: 26.8 pg (ref 26.0–34.0)
MCV: 79.9 fL (ref 78.0–100.0)
Platelets: 230 10*3/uL (ref 150–400)
RBC: 4.67 MIL/uL (ref 3.87–5.11)
RDW: 14.6 % (ref 11.5–15.5)

## 2011-10-18 LAB — BASIC METABOLIC PANEL
CO2: 27 mEq/L (ref 19–32)
Calcium: 9.5 mg/dL (ref 8.4–10.5)
Creatinine, Ser: 0.71 mg/dL (ref 0.50–1.10)

## 2011-10-18 NOTE — Pre-Procedure Instructions (Signed)
No Blood Draws/IV sticks/Blood Pressures in patient's Left Arm.  Hx Breast Cancer.

## 2011-10-18 NOTE — Pre-Procedure Instructions (Signed)
Reviewed patient's history with Dr Cristela Blue.  Per Dr Cristela Blue, ok for patient to take HCTZ BP med DOS with small sip of water.  Ok to see patient DOS.

## 2011-10-23 NOTE — H&P (Signed)
NAME:  Toni Williams, Toni Williams               ACCOUNT NO.:  MEDICAL RECORD NO.:  [id number]  LOCATION:                                 FACILITY:  PHYSICIAN:  Osborn Coho, M.D.   DATE OF BIRTH:  1951-05-07  DATE OF ADMISSION: DATE OF DISCHARGE:                             HISTORY & PHYSICAL   HISTORY OF PRESENT ILLNESS:  Toni Williams is a 60 year old married African American female, para 1-0-2-1 presenting for hysteroscopy, D and C because of postmenopausal bleeding and endometrial polyps.  The patient has been menopausal since age 49 and on tamoxifen therapy for the past 4 years without any bleeding episodes.  Approximately 6 months ago, however, the patient had 3 days of vaginal bleeding requiring her to wear pad, but only changed it once a day.  She denies any cramping, changes in her bowel movements, urinary frequency, urgency, dysuria, hematuria, or dyspareunia, though she does have occasional lower back pain.  A pelvic ultrasound in April 2012 showed uterus measuring 12.64 x 8.47 x 6.84 cm with a posterior subserosal fibroid measuring 5.66 x 4.99 x 6.05 cm and a submucosal fibroid measuring 3.31 x 4.57 x 3.76 cm. Both of patient's ovaries appeared normal on that study and her endometrium measured 0.79 cm.  Subsequently, the patient had an endometrial biopsy that returned without showing any hyperplasia, atypia, or malignancy.  However, there were fragments that were suggestive of benign endometrial polyps in the background of mucous and endometrial lining.  Given that the patient is on tamoxifen therapy and is menopausal, she has consented to proceed with further evaluation and management of her postmenopausal bleeding and endometrial polyp in the form of hysteroscopy, D and C.  PAST MEDICAL HISTORY:  OB History:  Gravida 3, para 1-0-2-1.  The patient had a cesarean section.  GYN History:  Menarche 60 years old.  Last menstrual period age 45.  The patient denies any sexually  transmitted diseases or abnormal Pap smears. Last normal Pap smear was March 2013.  Medical History:  Left breast cancer (ductal carcinoma in situ) in 2008, irritable bowel syndrome, hypertension, anxiety disorder, pneumonia, sinusitis, and seborrhea.  SURGERY:  In the 1980s, she underwent a D and C with subsequent blood transfusion, in 1986 cholecystectomy, 2008 left breast lumpectomy due to ductal carcinoma in situ, 2009 additional left breast surgery due to cancer.  She denies any problems with anesthesia.  FAMILY HISTORY:  Stomach, breast, and uterine cancers, diabetes mellitus, and stroke.  HABITS:  She denies any alcohol, tobacco, or illicit drug use.  SOCIAL HISTORY:  The patient is married and she works as a Scientist, physiological at [employer].  CURRENT MEDICATIONS: 1. Tamoxifen 20 mg a day. 2. Hydrochlorothiazide 25 mg daily. 3. Vitamin D 1000 international units daily. 4. Calcium daily. 5. Multivitamin daily. 6. Zyrtec 10 mg daily. 7. Astepro nasal spray daily. 8. Betamethasone cream as needed.  ALLERGIES:  The patient is allergic to MORPHINE that causes psychosis (the patient may take Vicodin).  She has an allergy to LATEX which causes a rash.  Denies any sensitivities to shellfish, soy, or peanuts.  REVIEW OF SYSTEMS:  The patient wears contact lenses.  She occasionally  has lower back pain.  She has rashes, particularly on her extremities due to seborrhea, but denies any chest pain, shortness of breath, nausea, vomiting, diarrhea, urinary tract symptoms, myalgias, or arthralgias and except as is mentioned in history of present illness the patient's review of systems is otherwise negative.  PHYSICAL EXAMINATION:  VITAL SIGNS:  Blood pressure 122/70 (the patient's blood pressure should only be taken in her right arm), pulse is 72, respirations 16, temperature 97.8 degrees Fahrenheit orally, weight 192 pounds, height 5 feet inches tall. NECK:  Supple  without masses.  There is no thyromegaly or cervical adenopathy. HEART:  Regular rate and rhythm. LUNGS:  Clear. BACK:  No CVA tenderness. ABDOMEN:  No tenderness, guarding, rebound, or organomegaly. EXTREMITIES:  No clubbing, cyanosis, or edema. PELVIC:  EGBUS is normal.  Vagina is atrophic.  Cervix is nontender without lesions.  Uterus appears 12 weeks size without tenderness. Adnexa without tenderness or masses.  IMPRESSION: 1. Postmenopausal bleeding. 2. Endometrial polyp. 3. Tamoxifen therapy.  DISPOSITION:  A discussion was held with the patient regarding indications for her procedure along with its risks which include but are not limited to reaction to anesthesia, damage to adjacent organs, infection, and excessive bleeding.  The patient verbalized understanding of these risks and has consented to proceed with hysteroscopy, D and C at the [facility] on October 27, 2011, at 9:30 a.m.     Devondre Guzzetta J. Lowell Guitar, P.A.-C   ______________________________ Osborn Coho, M.D.    EJP/MEDQ  D:  10/20/2011  T:  10/21/2011  Job:  [id number]  No change in H&P - AYR

## 2011-10-27 ENCOUNTER — Other Ambulatory Visit: Payer: Self-pay | Admitting: Obstetrics and Gynecology

## 2011-10-27 ENCOUNTER — Encounter (HOSPITAL_COMMUNITY)
Admission: RE | Disposition: A | Discharge status: Home / Self Care | Payer: Self-pay | Source: Ambulatory Visit | Attending: Obstetrics and Gynecology

## 2011-10-27 ENCOUNTER — Encounter (HOSPITAL_COMMUNITY): Payer: Self-pay | Admitting: Anesthesiology

## 2011-10-27 ENCOUNTER — Ambulatory Visit (HOSPITAL_COMMUNITY): Payer: BC Managed Care – PPO | Admitting: Anesthesiology

## 2011-10-27 ENCOUNTER — Ambulatory Visit (HOSPITAL_COMMUNITY)
Admission: RE | Admit: 2011-10-27 | Discharge: 2011-10-27 | Disposition: A | Discharge status: Home / Self Care | Payer: BC Managed Care – PPO | Source: Ambulatory Visit | Attending: Obstetrics and Gynecology | Admitting: Obstetrics and Gynecology

## 2011-10-27 DIAGNOSIS — N950 Postmenopausal bleeding: Secondary | ICD-10-CM | POA: Insufficient documentation

## 2011-10-27 DIAGNOSIS — Z01812 Encounter for preprocedural laboratory examination: Secondary | ICD-10-CM | POA: Insufficient documentation

## 2011-10-27 DIAGNOSIS — N840 Polyp of corpus uteri: Secondary | ICD-10-CM | POA: Insufficient documentation

## 2011-10-27 DIAGNOSIS — D250 Submucous leiomyoma of uterus: Secondary | ICD-10-CM | POA: Insufficient documentation

## 2011-10-27 DIAGNOSIS — D252 Subserosal leiomyoma of uterus: Secondary | ICD-10-CM | POA: Insufficient documentation

## 2011-10-27 DIAGNOSIS — Z01818 Encounter for other preprocedural examination: Secondary | ICD-10-CM | POA: Insufficient documentation

## 2011-10-27 HISTORY — PX: HYSTEROSCOPY WITH D & C: SHX1775

## 2011-10-27 SURGERY — DILATATION AND CURETTAGE /HYSTEROSCOPY
Anesthesia: General | Site: Vagina | Wound class: Clean Contaminated

## 2011-10-27 MED ORDER — GLYCINE 1.5 % IR SOLN
Status: DC | PRN
  Administered 2011-10-27: 3000 mL

## 2011-10-27 MED ORDER — ACETAMINOPHEN 325 MG PO TABS: 325.0000 mg | ORAL_TABLET | ORAL | Status: DC | PRN

## 2011-10-27 MED ORDER — PROPOFOL 10 MG/ML IV EMUL
INTRAVENOUS | Status: DC | PRN
  Administered 2011-10-27: 150 mg via INTRAVENOUS

## 2011-10-27 MED ORDER — KETOROLAC TROMETHAMINE 30 MG/ML IJ SOLN
INTRAMUSCULAR | Status: DC | PRN
  Administered 2011-10-27: 30 mg via INTRAVENOUS

## 2011-10-27 MED ORDER — LACTATED RINGERS IV SOLN
INTRAVENOUS | Status: DC
  Administered 2011-10-27: 09:00:00 via INTRAVENOUS

## 2011-10-27 MED ORDER — DEXAMETHASONE SODIUM PHOSPHATE 10 MG/ML IJ SOLN
INTRAMUSCULAR | Status: AC
  Filled 2011-10-27: qty 1

## 2011-10-27 MED ORDER — MIDAZOLAM HCL 2 MG/2ML IJ SOLN
INTRAMUSCULAR | Status: AC
  Filled 2011-10-27: qty 2

## 2011-10-27 MED ORDER — ACETAMINOPHEN 10 MG/ML IV SOLN
1000.0000 mg | Freq: Once | INTRAVENOUS | Status: AC
  Administered 2011-10-27: 1000 mg via INTRAVENOUS
  Filled 2011-10-27: qty 100

## 2011-10-27 MED ORDER — PROPOFOL 10 MG/ML IV EMUL
INTRAVENOUS | Status: AC
  Filled 2011-10-27: qty 20

## 2011-10-27 MED ORDER — KETOROLAC TROMETHAMINE 30 MG/ML IJ SOLN
INTRAMUSCULAR | Status: AC
  Filled 2011-10-27: qty 1

## 2011-10-27 MED ORDER — FENTANYL CITRATE 0.05 MG/ML IJ SOLN
INTRAMUSCULAR | Status: AC
  Filled 2011-10-27: qty 2

## 2011-10-27 MED ORDER — KETOROLAC TROMETHAMINE 30 MG/ML IJ SOLN: 15.0000 mg | Freq: Once | INTRAMUSCULAR | Status: DC | PRN

## 2011-10-27 MED ORDER — FENTANYL CITRATE 0.05 MG/ML IJ SOLN: 25.0000 ug | INTRAMUSCULAR | Status: DC | PRN

## 2011-10-27 MED ORDER — IBUPROFEN 600 MG PO TABS: 600.0000 mg | ORAL_TABLET | Freq: Four times a day (QID) | ORAL | Status: AC | PRN

## 2011-10-27 MED ORDER — ONDANSETRON HCL 4 MG/2ML IJ SOLN
INTRAMUSCULAR | Status: DC | PRN
  Administered 2011-10-27: 4 mg via INTRAVENOUS

## 2011-10-27 MED ORDER — LIDOCAINE HCL (CARDIAC) 20 MG/ML IV SOLN
INTRAVENOUS | Status: AC
  Filled 2011-10-27: qty 5

## 2011-10-27 MED ORDER — PROMETHAZINE HCL 25 MG/ML IJ SOLN: 6.2500 mg | INTRAMUSCULAR | Status: DC | PRN

## 2011-10-27 MED ORDER — DEXAMETHASONE SODIUM PHOSPHATE 10 MG/ML IJ SOLN
INTRAMUSCULAR | Status: DC | PRN
  Administered 2011-10-27: 10 mg via INTRAVENOUS

## 2011-10-27 MED ORDER — SCOPOLAMINE 1 MG/3DAYS TD PT72
MEDICATED_PATCH | TRANSDERMAL | Status: AC
  Filled 2011-10-27: qty 1

## 2011-10-27 MED ORDER — LIDOCAINE HCL (CARDIAC) 20 MG/ML IV SOLN
INTRAVENOUS | Status: DC | PRN
  Administered 2011-10-27: 60 mg via INTRAVENOUS

## 2011-10-27 MED ORDER — HYDROCODONE-ACETAMINOPHEN 5-500 MG PO TABS: 1.0000 | ORAL_TABLET | Freq: Four times a day (QID) | ORAL | Status: AC | PRN

## 2011-10-27 MED ORDER — ONDANSETRON HCL 4 MG/2ML IJ SOLN
INTRAMUSCULAR | Status: AC
  Filled 2011-10-27: qty 2

## 2011-10-27 MED ORDER — LIDOCAINE HCL 1 % IJ SOLN
INTRAMUSCULAR | Status: DC | PRN
  Administered 2011-10-27: 10 mL

## 2011-10-27 MED ORDER — FENTANYL CITRATE 0.05 MG/ML IJ SOLN
INTRAMUSCULAR | Status: DC | PRN
  Administered 2011-10-27: 100 ug via INTRAVENOUS

## 2011-10-27 MED ORDER — MIDAZOLAM HCL 5 MG/5ML IJ SOLN
INTRAMUSCULAR | Status: DC | PRN
  Administered 2011-10-27: 2 mg via INTRAVENOUS

## 2011-10-27 MED ORDER — SCOPOLAMINE 1 MG/3DAYS TD PT72
1.0000 | MEDICATED_PATCH | Freq: Once | TRANSDERMAL | Status: DC
  Administered 2011-10-27: 1.5 mg via TRANSDERMAL

## 2011-10-27 SURGICAL SUPPLY — 15 items
CANISTER SUCTION 2500CC (MISCELLANEOUS) ×3 IMPLANT
CATH ROBINSON RED A/P 16FR (CATHETERS) ×3 IMPLANT
CLOTH BEACON ORANGE TIMEOUT ST (SAFETY) ×3 IMPLANT
CONTAINER PREFILL 10% NBF 60ML (FORM) ×6 IMPLANT
ELECT REM PT RETURN 9FT ADLT (ELECTROSURGICAL) ×3
ELECTRODE REM PT RTRN 9FT ADLT (ELECTROSURGICAL) ×2 IMPLANT
GLOVE BIO SURGEON STRL SZ7.5 (GLOVE) ×6 IMPLANT
GLOVE BIOGEL PI IND STRL 7.5 (GLOVE) ×2 IMPLANT
GLOVE BIOGEL PI INDICATOR 7.5 (GLOVE) ×1
GOWN PREVENTION PLUS LG XLONG (DISPOSABLE) ×3 IMPLANT
GOWN STRL REIN XL XLG (GOWN DISPOSABLE) ×3 IMPLANT
LOOP ANGLED CUTTING 22FR (CUTTING LOOP) IMPLANT
PACK HYSTEROSCOPY LF (CUSTOM PROCEDURE TRAY) ×3 IMPLANT
TOWEL OR 17X24 6PK STRL BLUE (TOWEL DISPOSABLE) ×6 IMPLANT
WATER STERILE IRR 1000ML POUR (IV SOLUTION) ×3 IMPLANT

## 2011-10-27 NOTE — Op Note (Signed)
Preop Diagnosis: Post Menopausal Bleeding, Polyp   Postop Diagnosis: Post Menopausal Bleeding, Polyp   Procedure: HYSTEROSCOPY WITH RESECTOSCOPE   Anesthesia: General   Attending: Purcell Nails, MD   Assistant: N/a  Findings: Polypoid appearing tissue on left uterine side wall  Pathology: Endometrial curettings  Fluids:   UOP: QS via straight cath prior to procedure  EBL: Minimal  Complications: None  Procedure: The patient was taken to the operating room after the risks, benefits and alternatives were discussed with the patient. The patient verbalized understanding and consent signed and witnessed. The patient was placed under general anesthesia with LMA per anesthesia and prepped and draped in the normal sterile fashion and Time Out performed per protocol.  A bivalve speculum was placed in the patient's vagina and the anterior lip of the cervix was grasped with a single tooth tenaculum. A paracervical block was administered using a total of 10 cc of 1% lidocaine. The uterus sounded to 12 cm. The cervix was dilated for passage of the hysteroscope.  The hysteroscope was introduced into the uterine cavity and findings as noted above. Sharp curettage was performed until a gritty texture was noted and curettings sent to pathology. The hysteroscope was reintroduced and no obvious remaining intracavitary lesions were noted.  All instruments were removed. Sponge lap and needle count was correct. The patient tolerated the procedure well and was returned to the recovery room in good condition.

## 2011-10-27 NOTE — Anesthesia Preprocedure Evaluation (Signed)
Anesthesia Evaluation  Patient identified by MRN, date of birth, ID band Patient awake    Reviewed: Allergy & Precautions, H&P , Patient's Chart, lab work & pertinent test results, reviewed documented beta blocker date and time   History of Anesthesia Complications Negative for: history of anesthetic complications  Airway Mallampati: II TM Distance: >3 FB Neck ROM: full    Dental No notable dental hx.    Pulmonary neg pulmonary ROS,  clear to auscultation  Pulmonary exam normal       Cardiovascular Exercise Tolerance: Good hypertension, neg cardio ROS regular Normal    Neuro/Psych Negative Neurological ROS  Negative Psych ROS   GI/Hepatic negative GI ROS, Neg liver ROS,   Endo/Other  Negative Endocrine ROS  Renal/GU negative Renal ROS     Musculoskeletal   Abdominal   Peds  Hematology negative hematology ROS (+)   Anesthesia Other Findings   Reproductive/Obstetrics negative OB ROS                           Anesthesia Physical Anesthesia Plan  ASA: II  Anesthesia Plan: General LMA   Post-op Pain Management:    Induction:   Airway Management Planned:   Additional Equipment:   Intra-op Plan:   Post-operative Plan:   Informed Consent: I have reviewed the patients History and Physical, chart, labs and discussed the procedure including the risks, benefits and alternatives for the proposed anesthesia with the patient or authorized representative who has indicated his/her understanding and acceptance.   Dental Advisory Given  Plan Discussed with: CRNA, Surgeon and Anesthesiologist  Anesthesia Plan Comments:         Anesthesia Quick Evaluation

## 2011-10-27 NOTE — Transfer of Care (Signed)
Immediate Anesthesia Transfer of Care Note  Patient: Toni Williams  Procedure(s) Performed:  DILATATION AND CURETTAGE /HYSTEROSCOPY  Patient Location: PACU  Anesthesia Type: General  Level of Consciousness: sedated  Airway & Oxygen Therapy: Patient Spontanous Breathing and Patient connected to nasal cannula oxygen  Post-op Assessment: Report given to PACU RN and Post -op Vital signs reviewed and stable  Post vital signs: stable  Complications: No apparent anesthesia complications

## 2011-10-29 NOTE — Anesthesia Postprocedure Evaluation (Signed)
Anesthesia Post Note  Patient: Toni Williams  Procedure(s) Performed:  DILATATION AND CURETTAGE /HYSTEROSCOPY  Anesthesia type: GA  Patient location: PACU  Post pain: Pain level controlled  Post assessment: Post-op Vital signs reviewed  Last Vitals:  Filed Vitals:   10/27/11 1145  BP: 123/79  Pulse: 89  Temp:   Resp:     Post vital signs: Reviewed  Level of consciousness: sedated  Complications: No apparent anesthesia complications

## 2011-10-30 ENCOUNTER — Encounter (HOSPITAL_COMMUNITY): Payer: Self-pay | Admitting: Obstetrics and Gynecology

## 2011-11-05 ENCOUNTER — Other Ambulatory Visit: Payer: Self-pay | Admitting: Oncology

## 2012-02-08 ENCOUNTER — Ambulatory Visit: Payer: Self-pay | Admitting: Obstetrics and Gynecology

## 2012-02-11 ENCOUNTER — Ambulatory Visit (INDEPENDENT_AMBULATORY_CARE_PROVIDER_SITE_OTHER): Payer: BC Managed Care – PPO | Admitting: Internal Medicine

## 2012-02-11 VITALS — BP 124/86 | HR 98 | Temp 98.3°F | Resp 16 | Ht 63.5 in | Wt 191.6 lb

## 2012-02-11 DIAGNOSIS — I10 Essential (primary) hypertension: Secondary | ICD-10-CM

## 2012-02-11 DIAGNOSIS — L309 Dermatitis, unspecified: Secondary | ICD-10-CM

## 2012-02-11 DIAGNOSIS — J309 Allergic rhinitis, unspecified: Secondary | ICD-10-CM | POA: Insufficient documentation

## 2012-02-11 DIAGNOSIS — C50919 Malignant neoplasm of unspecified site of unspecified female breast: Secondary | ICD-10-CM | POA: Insufficient documentation

## 2012-02-11 DIAGNOSIS — L259 Unspecified contact dermatitis, unspecified cause: Secondary | ICD-10-CM

## 2012-02-11 MED ORDER — HYDROCORTISONE 2.5 % EX CREA: TOPICAL_CREAM | Freq: Two times a day (BID) | CUTANEOUS | Status: DC

## 2012-02-11 MED ORDER — TRIAMCINOLONE 0.1 % CREAM:EUCERIN CREAM 1:1: 1.0000 "application " | TOPICAL_CREAM | Freq: Three times a day (TID) | CUTANEOUS | Status: DC

## 2012-02-11 MED ORDER — PREDNISONE 10 MG PO TABS: ORAL_TABLET | ORAL | Status: DC

## 2012-02-11 NOTE — Progress Notes (Signed)
  Subjective:    Patient ID: Toni Williams, female    DOB: [birth year], 61 y.o.   MRN: [id number]  HPIRecent exacerbation of chronic skin problems Has recurrent problems with both hands with itching,vesicles, scaling Also with problems at her anterior thighs/antecubital fossa/popliteal fossa/neck And recently with her face though wonders if this is due to a new perfume product Has tried various steroid preparations without much success    Review of SystemsHypertension controlled on meds History of breast cancer stable on tamoxifen History of allergic rhinitis/over-the-counter preparations/recently very active     Objective:   Physical ExamHyperpigmented dry and scaling on the face symmetrical pattern Dry scales with papulovesicles on both palms and fingers in the web spaces Dermatitis changes over the arms and antecubital fossa and over the anterior thighs        Assessment & Plan:  Problem #1 eczema Problem #2  allergic rhinitis  Prednisone 30 mg for 4 days/20 mg for 4 days/10 mg for 4 days/5 mg for 4 days Triamcinolone/eucerin 3 times a day until controled zyrtec 10 mg daily Hytone 2.5% cream to face sparingly twice a day Generous use of moisturizer

## 2012-02-27 ENCOUNTER — Ambulatory Visit: Payer: BC Managed Care – PPO | Admitting: Obstetrics and Gynecology

## 2012-02-27 ENCOUNTER — Encounter: Payer: Self-pay | Admitting: Obstetrics and Gynecology

## 2012-02-27 VITALS — BP 122/70 | HR 72 | Resp 16 | Ht 64.0 in | Wt 191.0 lb

## 2012-02-27 DIAGNOSIS — Z01419 Encounter for gynecological examination (general) (routine) without abnormal findings: Secondary | ICD-10-CM

## 2012-02-27 DIAGNOSIS — Z124 Encounter for screening for malignant neoplasm of cervix: Secondary | ICD-10-CM

## 2012-02-27 NOTE — Progress Notes (Signed)
Contraception Menopause Last pap 01/24/2011 Last Mammo 08/2011 wnl Last Colonoscopy 2010 wnl Last Dexa Scan 2008 wnl Primary MD [facility] at Home none  Asked about a dermatologist otherwise no complaints  Filed Vitals:   02/27/12 1016  BP: 122/70  Pulse: 72  Resp: 16   Physical Examination: General appearance - alert, well appearing, and in no distress Neck - supple, no significant adenopathy Chest - clear to auscultation, no wheezes, rales or rhonchi, symmetric air entry Heart - normal rate and regular rhythm Abdomen - soft, nontender, nondistended, no masses or organomegaly Breasts - breasts appear normal, no suspicious masses, no skin or nipple changes or axillary nodes Pelvic - normal external genitalia, vulva, vagina, cervix, uterus and adnexa  ROS noncontributory  A/P Pap Refer to Derm if needs referral

## 2012-02-29 LAB — PAP IG W/ RFLX HPV ASCU

## 2012-03-15 ENCOUNTER — Other Ambulatory Visit: Payer: Self-pay | Admitting: Family Medicine

## 2012-04-24 ENCOUNTER — Encounter: Payer: Self-pay | Admitting: Family Medicine

## 2012-04-24 ENCOUNTER — Ambulatory Visit (INDEPENDENT_AMBULATORY_CARE_PROVIDER_SITE_OTHER): Payer: BC Managed Care – PPO | Admitting: Family Medicine

## 2012-04-24 VITALS — BP 120/83 | HR 89 | Temp 96.9°F | Resp 16 | Ht 63.75 in | Wt 191.0 lb

## 2012-04-24 DIAGNOSIS — I10 Essential (primary) hypertension: Secondary | ICD-10-CM

## 2012-04-24 DIAGNOSIS — R9431 Abnormal electrocardiogram [ECG] [EKG]: Secondary | ICD-10-CM

## 2012-04-24 DIAGNOSIS — L309 Dermatitis, unspecified: Secondary | ICD-10-CM

## 2012-04-24 DIAGNOSIS — Z13220 Encounter for screening for lipoid disorders: Secondary | ICD-10-CM

## 2012-04-24 DIAGNOSIS — E559 Vitamin D deficiency, unspecified: Secondary | ICD-10-CM

## 2012-04-24 DIAGNOSIS — L259 Unspecified contact dermatitis, unspecified cause: Secondary | ICD-10-CM

## 2012-04-24 DIAGNOSIS — Z0000 Encounter for general adult medical examination without abnormal findings: Secondary | ICD-10-CM

## 2012-04-24 LAB — CBC WITH DIFFERENTIAL/PLATELET
Basophils Relative: 1 % (ref 0–1)
Hemoglobin: 13.4 g/dL (ref 12.0–15.0)
MCHC: 33.3 g/dL (ref 30.0–36.0)
Monocytes Relative: 7 % (ref 3–12)
Neutro Abs: 2.1 10*3/uL (ref 1.7–7.7)
Neutrophils Relative %: 36 % — ABNORMAL LOW (ref 43–77)
Platelets: 304 10*3/uL (ref 150–400)
RBC: 5.13 MIL/uL — ABNORMAL HIGH (ref 3.87–5.11)

## 2012-04-24 LAB — POCT URINALYSIS DIPSTICK
Leukocytes, UA: NEGATIVE
Nitrite, UA: NEGATIVE
Urobilinogen, UA: 0.2

## 2012-04-24 LAB — LIPID PANEL
Cholesterol: 148 mg/dL (ref 0–200)
HDL: 54 mg/dL (ref >39)
Total CHOL/HDL Ratio: 2.7 Ratio

## 2012-04-24 LAB — COMPREHENSIVE METABOLIC PANEL
ALT: 15 U/L (ref 0–35)
Albumin: 4.2 g/dL (ref 3.5–5.2)
BUN: 12 mg/dL (ref 6–23)
CO2: 30 mEq/L (ref 19–32)
Calcium: 9.5 mg/dL (ref 8.4–10.5)
Chloride: 103 mEq/L (ref 96–112)
Creat: 0.64 mg/dL (ref 0.50–1.10)

## 2012-04-24 MED ORDER — HYDROCHLOROTHIAZIDE 25 MG PO TABS: 25.0000 mg | ORAL_TABLET | Freq: Every day | ORAL | Status: DC

## 2012-04-24 NOTE — Progress Notes (Signed)
Subjective:    Patient ID: Toni Williams, female    DOB: [birth year], 61 y.o.   MRN: [id number]  HPI  This 61 y.o. AA female presents for CPE and labs. 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         She has intermittent left anterior chest discomfort located under left breast, not associated with  pressure, SOB, diaphoresis, radiatio to neck or left arm. She describes  it as muscle spasm or twinge;  onset is at rest and it lasts less than a few minutes. She has HTN and is compliant with medication.          She has a chronic eczema-like rash on face and extremities, not responding to topical steroids.  Areas on face have different pigment after steroid use. Skin is itchy and dry. No triggers can be identified.   She is a Scientist, physiological, is a nonsmoker, and does not consume alcohol.     Last PAP:  April 2013 (normal)   Last MMG: Nov 2012 (normal)   DEXA: scheduled for Nov 2013   Colonoscopy: 2010 (normal)    Review of Systems  Constitutional: Negative.   HENT: Positive for congestion and postnasal drip. Negative for sore throat, rhinorrhea, sneezing and sinus pressure.   Eyes: Negative.   Respiratory: Negative.   Cardiovascular: Negative.   Gastrointestinal: Negative.   Genitourinary: Negative for dysuria, urgency, frequency and hematuria.       Evaluated by GYN  Musculoskeletal: Negative.   Skin: Negative.   Neurological: Negative.   Hematological: Negative.   Psychiatric/Behavioral: Negative.        Objective:   Physical Exam  Vitals reviewed. Constitutional: She is oriented to person, place, and time. She appears well-developed and well-nourished. No distress.  HENT:  Head: Normocephalic and atraumatic.  Right Ear: External ear normal.  Left Ear: External ear normal.  Nose: Nose normal.  Mouth/Throat: Oropharynx is clear and moist.  Eyes: EOM are normal. Pupils are equal, round, and reactive to light. No  scleral icterus.       Conj injected  Neck: Neck supple. No thyromegaly present.  Cardiovascular: Normal rate, regular rhythm, normal heart sounds and intact distal pulses.  Exam reveals no gallop and no friction rub.   No murmur heard. Pulmonary/Chest: Effort normal and breath sounds normal. No respiratory distress. She has no wheezes. She exhibits no tenderness.  Abdominal: Soft. Bowel sounds are normal. She exhibits no mass. There is no tenderness. There is no guarding.       No organomegaly  Musculoskeletal: Normal range of motion. She exhibits no edema and no tenderness.  Lymphadenopathy:    She has no cervical adenopathy.  Neurological: She is alert and oriented to person, place, and time. She has normal reflexes. She displays normal reflexes. No cranial nerve deficit. Coordination normal.  Skin: Skin is warm and dry. Rash noted. No erythema.       Eczematoid rash on face, hands/ forearms, lower extremities- hyperpigmentation and maculopapular lesions  Psychiatric: She has a normal mood and affect. Her behavior is normal. Thought content normal.    ECG: Sinus rhythm; LBBB (possible inferior infarct of undetermined age)     Results for orders placed in visit on 04/24/12  POCT URINALYSIS DIPSTICK      Component Value Range   Color, UA yellow     Clarity, UA clear     Glucose, UA neg     Bilirubin, UA  neg     Ketones, UA neg     Spec Grav, UA 1.025     Blood, UA neg     pH, UA 6.0     Protein, UA trace     Urobilinogen, UA 0.2     Nitrite, UA neg     Leukocytes, UA Negative          Assessment & Plan:   1. Routine general medical examination at a health care facility    2. HTN (hypertension)  Comprehensive metabolic panel, EKG 12-Lead  3. Unspecified vitamin D deficiency  Vitamin D, 25-hydroxy, TSH, T4, Free  4. Dermatitis - advised skin care regimen for facial area (i.e. Aveeno for sensitive skin); may continue oatmeal soap on body Comprehensive metabolic panel,  Ambulatory referral to Dermatology, Sedimentation Rate, CBC with Differential  5. Abnormal resting ECG findings  Phone call to [facility] today- pt scheduled for evaluation with Dr. Eden Emms on 05/17/12. Pt advised to proceed to ED for worsening or persistent symptoms   6. Screening for hyperlipidemia  Lipid panel   Review of records indicates that pt needs Tdap; she will be contacted and advised of this; it can be given at her next follow-up visit here.

## 2012-04-24 NOTE — Patient Instructions (Addendum)

## 2012-04-25 LAB — SEDIMENTATION RATE: Sed Rate: 7 mm/hr (ref 0–22)

## 2012-04-25 LAB — VITAMIN D 25 HYDROXY (VIT D DEFICIENCY, FRACTURES): Vit D, 25-Hydroxy: 46 ng/mL (ref 30–89)

## 2012-04-28 NOTE — Progress Notes (Signed)
Quick Note:  Please call pt and advise that the following labs are abnormal... All your labs look normal except one of the value on the CBC (complete blood count); eosinophils are elevated but this is consistent with the persistent Dermatitis (skin condition) that you have.  Continue current medications and treatments as we discussed.  Copy to pt.  ______

## 2012-05-17 ENCOUNTER — Encounter: Payer: Self-pay | Admitting: Cardiovascular Disease

## 2012-05-17 ENCOUNTER — Ambulatory Visit (INDEPENDENT_AMBULATORY_CARE_PROVIDER_SITE_OTHER): Payer: BC Managed Care – PPO | Admitting: Cardiovascular Disease

## 2012-05-17 VITALS — BP 132/70 | HR 92 | Ht 64.0 in | Wt 191.0 lb

## 2012-05-17 DIAGNOSIS — C50919 Malignant neoplasm of unspecified site of unspecified female breast: Secondary | ICD-10-CM

## 2012-05-17 DIAGNOSIS — J339 Nasal polyp, unspecified: Secondary | ICD-10-CM

## 2012-05-17 DIAGNOSIS — R9431 Abnormal electrocardiogram [ECG] [EKG]: Secondary | ICD-10-CM

## 2012-05-17 DIAGNOSIS — I10 Essential (primary) hypertension: Secondary | ICD-10-CM

## 2012-05-17 DIAGNOSIS — R079 Chest pain, unspecified: Secondary | ICD-10-CM | POA: Insufficient documentation

## 2012-05-17 DIAGNOSIS — I447 Left bundle-branch block, unspecified: Secondary | ICD-10-CM

## 2012-05-17 NOTE — Patient Instructions (Signed)
Your physician wants you to follow-up in: YEAR WITH DR Haywood Filler will receive a reminder letter in the mail two months in advance. If you don't receive a letter, please call our office to schedule the follow-up appointment. Your physician recommends that you continue on your current medications as directed. Please refer to the Current Medication list given to you today. Your physician has requested that you have an echocardiogram. Echocardiography is a painless test that uses sound waves to create images of your heart. It provides your doctor with information about the size and shape of your heart and how well your heart's chambers and valves are working. This procedure takes approximately one hour. There are no restrictions for this procedure.  DX LBBB You have been referred to  DR SHOEMAKER  DX NASAL POLYPS Your physician has requested that you have a lexiscan myoview. For further information please visit [email or web address] Please follow instruction sheet, as given. DX CHEST PAIN  LBBB

## 2012-05-17 NOTE — Assessment & Plan Note (Signed)
No recurrence F/U Dr Donnie Coffin

## 2012-05-17 NOTE — Assessment & Plan Note (Signed)
Well controlled.  Continue current medications and low sodium Dash type diet.   May need to adjust meds pending echo.

## 2012-05-17 NOTE — Assessment & Plan Note (Signed)
Chronic LBBB.  May be secondary to HTN.  With dyspnea and atypical chest pain F/U echo for EF

## 2012-05-17 NOTE — Progress Notes (Signed)
Patient ID: Toni Williams, female   DOB: 08/14/1951, 61 y.o.   MRN: [id number] 61 yo [facility]   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 She has intermittent left anterior chest discomfort located under left breast, not associated with pressure, SOB, diaphoresis, radiatio to neck or left arm. She describes it as muscle spasm or twinge; onset is at rest and it lasts less than a few minutes. She has HTN and is compliant with medication. ECG in 12/12 showed LBBB.  Recent ECG did also.  Have none prior to this. Sees Dr Donnie Coffin for cancer F/U.  Mild exetional dyspnea. No pleurisy.  No cough or fever  ROS: Denies fever, malais, weight loss, blurry vision, decreased visual acuity, cough, sputum, SOB, hemoptysis, pleuritic pain, palpitaitons, heartburn, abdominal pain, melena, lower extremity edema, claudication, or rash.  All other systems reviewed and negative   General: Affect appropriate Obese black female HEENT: normal Neck supple with no adenopathy JVP normal no bruits no thyromegaly Lungs clear with no wheezing and good diaphragmatic motion Heart:  S1/S2 no murmur,rub, gallop or click PMI normal Abdomen: benighn, BS positve, no tenderness, no AAA no bruit.  No HSM or HJR Distal pulses intact with no bruits No edema Neuro non-focal Skin warm and dry No muscular weakness  Medications Current Outpatient Prescriptions  Medication Sig Dispense Refill  . Azelastine HCl (ASTEPRO NA) Place 2 sprays into the nose at bedtime.       . calcium carbonate (OS-CAL) 600 MG TABS Take 600 mg by mouth daily.        . cetirizine (ZYRTEC) 10 MG tablet Take 10 mg by mouth daily as needed. For allergies      . cholecalciferol (VITAMIN D) 1000 UNITS tablet Take 2,000 Units by mouth daily.        . hydrochlorothiazide (HYDRODIURIL) 25 MG tablet Take 1 tablet (25 mg total) by mouth daily.  90 tablet  3  . hydrocortisone  2.5 % cream Apply topically 2 (two) times daily. Apply lightly on face if needed  30 g  3  . tamoxifen (NOLVADEX) 20 MG tablet TAKE 1 TABLET EVERY DAY  90 tablet  3  . Triamcinolone Acetonide (TRIAMCINOLONE 0.1 % CREAM : EUCERIN) CREA Apply 1 application topically 3 (three) times daily. For all areas except face  1 each  11    Allergies Morphine and related and Latex  Family History: Family History  Problem Relation Age of Onset  . Cancer Mother 16    breast (R),stomach, and uterine  . Cancer Father 50    lung from smoking  3 pks/day  . Diabetes Maternal Grandmother     amputation of (L) leg  . Stroke Maternal Grandfather 68    Social History: History   Social History  . Marital Status: Married    Spouse Name: N/A    Number of Children: N/A  . Years of Education: N/A   Occupational History  . Not on file.   Social History Main Topics  . Smoking status: Former Smoker -- 0.2 packs/day for 2 years    Types: Cigarettes    Quit date: 10/30/1976  . Smokeless tobacco: Never Used  . Alcohol Use: No  . Drug Use: No  . Sexually Active: Not Currently    Birth Control/ Protection: None   Other Topics Concern  . Not on file   Social History Narrative  . No narrative on  file    Electrocardiogram:  6/26  NSR LBBB  No change from 10/18/11 reviewed where she also had LBBB  Assessment and Plan

## 2012-05-17 NOTE — Assessment & Plan Note (Signed)
Atypical but LBBB and HTN  F/U lexiscan myovue

## 2012-05-27 ENCOUNTER — Ambulatory Visit (HOSPITAL_COMMUNITY): Payer: BC Managed Care – PPO | Attending: Cardiovascular Disease

## 2012-05-27 DIAGNOSIS — I079 Rheumatic tricuspid valve disease, unspecified: Secondary | ICD-10-CM | POA: Insufficient documentation

## 2012-05-27 DIAGNOSIS — R9431 Abnormal electrocardiogram [ECG] [EKG]: Secondary | ICD-10-CM

## 2012-05-27 DIAGNOSIS — I10 Essential (primary) hypertension: Secondary | ICD-10-CM | POA: Insufficient documentation

## 2012-05-27 DIAGNOSIS — R072 Precordial pain: Secondary | ICD-10-CM

## 2012-05-27 DIAGNOSIS — I447 Left bundle-branch block, unspecified: Secondary | ICD-10-CM

## 2012-05-27 DIAGNOSIS — I379 Nonrheumatic pulmonary valve disorder, unspecified: Secondary | ICD-10-CM | POA: Insufficient documentation

## 2012-05-27 DIAGNOSIS — I059 Rheumatic mitral valve disease, unspecified: Secondary | ICD-10-CM | POA: Insufficient documentation

## 2012-05-27 NOTE — Progress Notes (Signed)
Echocardiogram performed.  

## 2012-05-28 ENCOUNTER — Ambulatory Visit (HOSPITAL_COMMUNITY): Payer: BC Managed Care – PPO | Attending: Cardiology | Admitting: Radiology

## 2012-05-28 VITALS — BP 120/72 | HR 96 | Ht 65.0 in | Wt 190.0 lb

## 2012-05-28 DIAGNOSIS — I447 Left bundle-branch block, unspecified: Secondary | ICD-10-CM | POA: Insufficient documentation

## 2012-05-28 DIAGNOSIS — I10 Essential (primary) hypertension: Secondary | ICD-10-CM

## 2012-05-28 DIAGNOSIS — R9431 Abnormal electrocardiogram [ECG] [EKG]: Secondary | ICD-10-CM | POA: Insufficient documentation

## 2012-05-28 DIAGNOSIS — R079 Chest pain, unspecified: Secondary | ICD-10-CM | POA: Insufficient documentation

## 2012-05-28 DIAGNOSIS — R0602 Shortness of breath: Secondary | ICD-10-CM

## 2012-05-28 MED ORDER — TECHNETIUM TC 99M TETROFOSMIN IV KIT
33.0000 | PACK | Freq: Once | INTRAVENOUS | Status: AC | PRN
  Administered 2012-05-28: 33 via INTRAVENOUS

## 2012-05-28 MED ORDER — TECHNETIUM TC 99M TETROFOSMIN IV KIT
11.0000 | PACK | Freq: Once | INTRAVENOUS | Status: AC | PRN
  Administered 2012-05-28: 11 via INTRAVENOUS

## 2012-05-28 MED ORDER — ADENOSINE (DIAGNOSTIC) 3 MG/ML IV SOLN
0.5600 mg/kg | Freq: Once | INTRAVENOUS | Status: AC
  Administered 2012-05-28: 48.3 mg via INTRAVENOUS

## 2012-05-28 NOTE — Progress Notes (Signed)
[facility] SITE 3 NUCLEAR MED [address] [place] [address] [phone]  Cardiology Nuclear Med Study  Toni Williams is a 61 y.o. female     MRN : [id number]     DOB: [birth year]  Procedure Date: 05/28/2012  Nuclear Med Background Indication for Stress Test:  Evaluation for Ischemia History:  No previous documented CAD. Cardiac Risk Factors: History of Smoking, Hypertension, LBBB and Overweight  Symptoms:  Chest Pain (last date of chest discomfort was about 2-days ago) and DOE   Nuclear Pre-Procedure Caffeine/Decaff Intake:  None x 12 hrs NPO After: 10:00pm   Lungs:  Clear. O2 Sat: 96% on room air. IV 0.9% NS with Angio Cath:  22g  IV Site: R Forearm x 1, tolerated well IV Started by:  Irean Hong, RN  Chest Size (in):  40 Cup Size: C  Height: 5\' 5"  (1.651 m)  Weight:  190 lb (86.183 kg)  BMI:  Body mass index is 31.62 kg/(m^2). Tech Comments:  n/a    Nuclear Med Study 1 or 2 day study: 1 day  Stress Test Type:  Adenosine  Reading MD: Marca Ancona, MD  Order Authorizing Provider:  Charlton Haws, MD  Resting Radionuclide: Technetium 50m Tetrofosmin  Resting Radionuclide Dose: 11.0 mCi   Stress Radionuclide:  Technetium 83m Tetrofosmin  Stress Radionuclide Dose: 33.0 mCi           Stress Protocol Rest HR: 96 Stress HR: 139  Rest BP: 120/72 Stress BP: 127/73  Exercise Time (min): n/a METS: n/a   Predicted Max HR: 159 bpm % Max HR: 87.42 bpm Rate Pressure Product: 32440   Dose of Adenosine (mg):  48.4 Dose of Lexiscan: n/a mg  Dose of Atropine (mg): n/a Dose of Dobutamine: n/a mcg/kg/min (at max HR)  Stress Test Technologist: Smiley Houseman, CMA-N  Nuclear Technologist:  Domenic Polite, CNMT     Rest Procedure:  Myocardial perfusion imaging was performed at rest 45 minutes following the intravenous administration of Technetium 54m Tetrofosmin.  Rest ECG: LBBB.  Stress Procedure:  The patient received IV adenosine at 140 mcg/kg/min  for 4 minutes.  The patient developed a accelerated junctional rhythm with infusion.  She did c/o chest tightness with infusion.  Technetium 46m Tetrofosmin was injected at the 2 minute mark and quantitative spect images were obtained after a 45 minute delay.  Stress ECG: Uninteretable due to baseline LBBB  QPS Raw Data Images:  Normal; no motion artifact; normal heart/lung ratio. Stress Images:  Small apical perfusion defect.  Rest Images:  Small apical perfusion defect.  Subtraction (SDS):  Small fixed apical perfusion defect.  Transient Ischemic Dilatation (Normal <1.22):  0.96 Lung/Heart Ratio (Normal <0.45):  0.33  Quantitative Gated Spect Images QGS EDV:  57 ml QGS ESV:  17 ml  Impression Exercise Capacity:  Adenosine study with no exercise. BP Response:  Normal blood pressure response. Clinical Symptoms:  Chest tightness.  ECG Impression:  LBBB, ? Junctional tachycardia with infusion (rate 130).  Comparison with Prior Nuclear Study: No previous nuclear study performed.  Overall Impression:  Low risk stress nuclear study.  Small fixed apical perfusion defect with normal wall motion may represent soft tissue attenuation, less likely prior infarction.   LV Ejection Fraction: 70%.  LV Wall Motion:  NL LV Function; NL Wall Motion  Marca Ancona 05/28/2012

## 2012-06-04 ENCOUNTER — Telehealth: Payer: Self-pay | Admitting: Cardiovascular Disease

## 2012-06-04 NOTE — Telephone Encounter (Signed)
New msg °Pt wants to know echo results Please call °

## 2012-06-04 NOTE — Telephone Encounter (Signed)
         Rosendo Gros P ','<More Detail >>       Wendall Stade, MD         Sent:  Tue June 04, 2012 5:02 PM   To:  Alois Cliche, LPN                 Message     Echo with just mild MR othewise normal   ----- Message -----   From: Alois Cliche, LPN   Sent: 11/04/1094 3:43 PM   To: Wendall Stade, MD      PT CALLING FOR ECHO RESULTS NO RESULTS NOTED   THANKS   Blasa Raisch            Attached Reports     The sender attached the following reports to this message:   Order report 1         PT AWARE OF ECHO RESULTS./CY

## 2012-09-09 ENCOUNTER — Telehealth: Payer: Self-pay

## 2012-09-09 NOTE — Telephone Encounter (Signed)
Last office notes sent

## 2012-09-09 NOTE — Telephone Encounter (Signed)
[facility] for pt going to surgery on the 13th, needs to review tomorrow    Fax [phone]  Phone Debbie [phone] ext [phone]

## 2012-09-10 ENCOUNTER — Other Ambulatory Visit: Payer: BC Managed Care – PPO | Admitting: Lab

## 2012-09-11 ENCOUNTER — Other Ambulatory Visit: Payer: Self-pay | Admitting: Otolaryngology

## 2012-09-17 ENCOUNTER — Other Ambulatory Visit (HOSPITAL_BASED_OUTPATIENT_CLINIC_OR_DEPARTMENT_OTHER): Payer: BC Managed Care – PPO | Admitting: Lab

## 2012-09-17 ENCOUNTER — Ambulatory Visit: Payer: BC Managed Care – PPO | Admitting: Oncology

## 2012-09-17 DIAGNOSIS — D0510 Intraductal carcinoma in situ of unspecified breast: Secondary | ICD-10-CM

## 2012-09-17 DIAGNOSIS — D0590 Unspecified type of carcinoma in situ of unspecified breast: Secondary | ICD-10-CM

## 2012-09-17 DIAGNOSIS — E559 Vitamin D deficiency, unspecified: Secondary | ICD-10-CM

## 2012-09-17 LAB — COMPREHENSIVE METABOLIC PANEL (CC13)
ALT: 15 U/L (ref 0–55)
Albumin: 3.5 g/dL (ref 3.5–5.0)
CO2: 27 mEq/L (ref 22–29)
Calcium: 10 mg/dL (ref 8.4–10.4)
Chloride: 105 mEq/L (ref 98–107)
Glucose: 121 mg/dl — ABNORMAL HIGH (ref 70–99)
Potassium: 3.6 mEq/L (ref 3.5–5.1)
Sodium: 140 mEq/L (ref 136–145)
Total Protein: 7.5 g/dL (ref 6.4–8.3)

## 2012-09-17 LAB — CBC WITH DIFFERENTIAL/PLATELET
Eosinophils Absolute: 0.7 10*3/uL — ABNORMAL HIGH (ref 0.0–0.5)
HCT: 37 % (ref 34.8–46.6)
LYMPH%: 33.4 % (ref 14.0–49.7)
MCV: 80.9 fL (ref 79.5–101.0)
MONO%: 10.6 % (ref 0.0–14.0)
NEUT#: 3.9 10*3/uL (ref 1.5–6.5)
NEUT%: 47.2 % (ref 38.4–76.8)
Platelets: 277 10*3/uL (ref 145–400)
RBC: 4.57 10*6/uL (ref 3.70–5.45)

## 2012-09-18 LAB — VITAMIN D 25 HYDROXY (VIT D DEFICIENCY, FRACTURES): Vit D, 25-Hydroxy: 46 ng/mL (ref 30–89)

## 2012-09-24 ENCOUNTER — Telehealth: Payer: Self-pay | Admitting: *Deleted

## 2012-09-24 ENCOUNTER — Ambulatory Visit (HOSPITAL_BASED_OUTPATIENT_CLINIC_OR_DEPARTMENT_OTHER): Payer: BC Managed Care – PPO | Admitting: Oncology

## 2012-09-24 VITALS — BP 108/74 | HR 106 | Temp 98.2°F | Resp 20 | Ht 65.0 in | Wt 190.2 lb

## 2012-09-24 DIAGNOSIS — Z170 Estrogen receptor positive status [ER+]: Secondary | ICD-10-CM

## 2012-09-24 DIAGNOSIS — C50919 Malignant neoplasm of unspecified site of unspecified female breast: Secondary | ICD-10-CM

## 2012-09-24 DIAGNOSIS — D0590 Unspecified type of carcinoma in situ of unspecified breast: Secondary | ICD-10-CM

## 2012-09-24 NOTE — Telephone Encounter (Signed)
Gave patient appointment for 2014 

## 2012-09-24 NOTE — Progress Notes (Signed)
Hematology and Oncology Follow Up Visit  Toni Williams [id number] 10/15/1951 61 y.o. 09/24/2012 10:01 AM   Principle Diagnosis: DCIS status post lumpectomy 10/01/2007, status post radiation therapy completed 02/03/2008, ER positive PR positive on tamoxifen here for one-year followup visit  Interim History:  The patient is doing without complaint. She continues on tamoxifen. She has no problems with her. She'll recent as a nasal polyp excision in October of this year. She'll recent bone density and 8 mammogram results of which are still pending. She had a cardiac workup in the summer and this was fine, this was secondary to an EKG which showed left bundle branch block.. She is amenorrheic and not having any abnormal bleeding. Medications: I have reviewed the patient's current medications.  Allergies:  Allergies  Allergen Reactions  . Morphine And Related Other (See Comments)    "psychotic" reaction per pt report  . Latex Rash    itching    Past Medical History, Surgical history, Social history, and Family History were reviewed and updated.  Review of Systems: Constitutional:  Negative for fever, chills, night sweats, anorexia, weight loss, pain. Cardiovascular: no chest pain or dyspnea on exertion Respiratory: no cough, shortness of breath, or wheezing Neurological: no TIA or stroke symptoms Dermatological: negative ENT: negative Skin Gastrointestinal: no abdominal pain, change in bowel habits, or black or bloody stools Genito-Urinary: no dysuria, trouble voiding, or hematuria Hematological and Lymphatic: negative Breast: negative for breast lumps Musculoskeletal: negative Remaining ROS negative.  Physical Exam: Blood pressure 108/74, pulse 106, temperature 98.2 F (36.8 C), temperature source Oral, resp. rate 20, height 5\' 5"  (1.651 m), weight 190 lb 3.2 oz (86.274 kg). ECOG: 0 General appearance: alert, cooperative and appears stated age Head: Normocephalic, without  obvious abnormality, atraumatic Neck: no adenopathy, no carotid bruit, no JVD, supple, symmetrical, trachea midline and thyroid not enlarged, symmetric, no tenderness/mass/nodules Lymph nodes: Cervical, supraclavicular, and axillary nodes normal. Cardiac : Normal Pulmonary: Normal Breasts: Bilateral breast exam without evidence of mass, skin change or nipple retraction, both axilla negative Abdomen: Normal Extremities normal Neuro: Normal  Lab Results: Lab Results  Component Value Date   WBC 8.2 09/17/2012   HGB 12.4 09/17/2012   HCT 37.0 09/17/2012   MCV 80.9 09/17/2012   PLT 277 09/17/2012     Chemistry      Component Value Date/Time   NA 140 09/17/2012 1342   NA 141 04/24/2012 1032   K 3.6 09/17/2012 1342   K 3.9 04/24/2012 1032   CL 105 09/17/2012 1342   CL 103 04/24/2012 1032   CO2 27 09/17/2012 1342   CO2 30 04/24/2012 1032   BUN 15.0 09/17/2012 1342   BUN 12 04/24/2012 1032   CREATININE 1.0 09/17/2012 1342   CREATININE 0.64 04/24/2012 1032   CREATININE 0.71 10/18/2011 1217      Component Value Date/Time   CALCIUM 10.0 09/17/2012 1342   CALCIUM 9.5 04/24/2012 1032   ALKPHOS 60 09/17/2012 1342   ALKPHOS 53 04/24/2012 1032   AST 15 09/17/2012 1342   AST 19 04/24/2012 1032   ALT 15 09/17/2012 1342   ALT 15 04/24/2012 1032   BILITOT 0.53 09/17/2012 1342   BILITOT 0.5 04/24/2012 1032       Radiological Studies: chest X-ray n/a Mammogram Done 11/20-wnl Bone density Pending  Impression and Plan: History of DCIS with followup radiation completed 4.5  years ago tolerating tamoxifen. I will see her in a years time. We'll likely discontinue tamoxifen at that point. She is  otherwise doing well following tamoxifen.  More than 50% of the visit was spent in patient-related counselling   Pierce Crane, MD 11/26/201310:01 AM

## 2012-11-15 ENCOUNTER — Other Ambulatory Visit: Payer: Self-pay | Admitting: Emergency Medicine

## 2012-11-15 MED ORDER — TAMOXIFEN CITRATE 20 MG PO TABS: 20.0000 mg | ORAL_TABLET | Freq: Every day | ORAL | Status: DC

## 2012-11-16 ENCOUNTER — Ambulatory Visit (INDEPENDENT_AMBULATORY_CARE_PROVIDER_SITE_OTHER): Payer: BC Managed Care – PPO | Admitting: Family Medicine

## 2012-11-16 VITALS — BP 123/81 | HR 83 | Temp 98.6°F | Resp 18 | Ht 64.0 in | Wt 190.0 lb

## 2012-11-16 DIAGNOSIS — M549 Dorsalgia, unspecified: Secondary | ICD-10-CM

## 2012-11-16 DIAGNOSIS — M5430 Sciatica, unspecified side: Secondary | ICD-10-CM

## 2012-11-16 MED ORDER — CYCLOBENZAPRINE HCL 10 MG PO TABS: 10.0000 mg | ORAL_TABLET | Freq: Two times a day (BID) | ORAL | Status: DC | PRN

## 2012-11-16 MED ORDER — MELOXICAM 15 MG PO TABS: 15.0000 mg | ORAL_TABLET | Freq: Every day | ORAL | Status: DC

## 2012-11-16 NOTE — Patient Instructions (Signed)

## 2012-11-16 NOTE — Progress Notes (Signed)
This is a 62 y.o.female who presents with low back pain today.   Patient has not a past history of low back pain for which she   Toni Williams denies any urinary symptoms, bowel problems, numbness in the legs, loss of motor power. Trinna Balloon had no fever.  SJacqueline P Taylor has tried hydrocodone, which did not help much.  Past Medical History  Diagnosis Date  . Cancer   . Missed abortion     x 1  . Termination of pregnancy     x 1  . Seasonal allergies   . Hypertension   . Blood transfusion 1979    with missed abortion in [facility] -Dr Mikey Bussing  . Anxiety     no meds     Past Surgical History  Procedure Date  . Cholecystectomy   . Wisdom tooth extraction   . Cesarean section   . Colonscopy   . Dilation and curettage of uterus   . Breast surgery 10/03/2007, 11/06/2007    Left breast excision, re-excision Left breast  . Breast surgery     No IV sticks/Blood Draws/Blood Pressure Left Arm,  . Hysteroscopy w/d&c 10/27/2011    Procedure: DILATATION AND CURETTAGE /HYSTEROSCOPY;  Surgeon: Purcell Nails, MD;  Location: [facility];  Service: Gynecology;  Laterality: N/A;    Objective:  middle-aged female in no acute distress. Blood pressure 123/81, pulse 83, temperature 98.6 F (37 C), temperature source Oral, resp. rate 18, height 5\' 4"  (1.626 m), weight 190 lb (86.183 kg), SpO2 98.00%.Body mass index is 32.61 kg/(m^2). Palpation of the back reveals no localized tenderness  Inspection of the back: Reveals no scoliosis  Straight-leg raising: negative  Motor exam of lower extremity: No abnormal weakness.  Reflexes: Symmetric and normal  AJ and KJ  Skin exam: normal  Assessment/Plan: Acute lower back pain without acute neurological findings.  Plan:

## 2013-01-18 ENCOUNTER — Telehealth: Payer: Self-pay | Admitting: Oncology

## 2013-01-18 ENCOUNTER — Encounter: Payer: Self-pay | Admitting: Oncology

## 2013-01-18 NOTE — Telephone Encounter (Signed)
lvm for pt regarding to appt change...mailed letter and appt schedule....former pt of Dr. Donnie Coffin r/s to Dr. Welton Flakes

## 2013-02-22 ENCOUNTER — Ambulatory Visit: Payer: BC Managed Care – PPO

## 2013-02-22 ENCOUNTER — Ambulatory Visit (INDEPENDENT_AMBULATORY_CARE_PROVIDER_SITE_OTHER): Payer: BC Managed Care – PPO | Admitting: Family Medicine

## 2013-02-22 VITALS — BP 124/82 | HR 136 | Temp 102.8°F | Resp 17 | Ht 65.0 in | Wt 191.0 lb

## 2013-02-22 DIAGNOSIS — R05 Cough: Secondary | ICD-10-CM

## 2013-02-22 DIAGNOSIS — R059 Cough, unspecified: Secondary | ICD-10-CM

## 2013-02-22 DIAGNOSIS — R509 Fever, unspecified: Secondary | ICD-10-CM

## 2013-02-22 DIAGNOSIS — J189 Pneumonia, unspecified organism: Secondary | ICD-10-CM

## 2013-02-22 LAB — POCT CBC
Granulocyte percent: 78.9 %G (ref 37–80)
HCT, POC: 40.2 % (ref 37.7–47.9)
Hemoglobin: 12.5 g/dL (ref 12.2–16.2)
Lymph, poc: 1 (ref 0.6–3.4)
MCH, POC: 25 pg — AB (ref 27–31.2)
MCHC: 31.1 g/dL — AB (ref 31.8–35.4)
MCV: 80.5 fL (ref 80–97)
MID (cbc): 0.6 (ref 0–0.9)
MPV: 9.1 fL (ref 0–99.8)
POC Granulocyte: 6 (ref 2–6.9)
POC LYMPH PERCENT: 12.7 %L (ref 10–50)
POC MID %: 8.4 %M (ref 0–12)
Platelet Count, POC: 264 10*3/uL (ref 142–424)
RBC: 5 M/uL (ref 4.04–5.48)
RDW, POC: 14.8 %
WBC: 7.6 10*3/uL (ref 4.6–10.2)

## 2013-02-22 MED ORDER — CEFTRIAXONE SODIUM 1 G IJ SOLR: 1.0000 g | Freq: Once | INTRAMUSCULAR | Status: DC

## 2013-02-22 MED ORDER — LEVOFLOXACIN 500 MG PO TABS: 500.0000 mg | ORAL_TABLET | Freq: Every day | ORAL | Status: DC

## 2013-02-22 MED ORDER — CEFTRIAXONE SODIUM 1 G IJ SOLR
1.0000 g | Freq: Once | INTRAMUSCULAR | Status: AC
  Administered 2013-02-22: 1 g via INTRAMUSCULAR

## 2013-02-22 NOTE — Progress Notes (Signed)
62 yo woman with acute fever, cough over night.   No tick bite. No sweats, chills, or shortness of breath.  Patient states symptoms are progressive and she is weak.  No vomiting or diarrhea, no abdominal pain   Obj:  NAD HEENT:  Neg Chest:  Rales right side Heart: reg, no murmur   [facility] reading (PRIMARY) by  Dr. Milus Glazier:  cxr-heavy right sided markings Results for orders placed in visit on 02/22/13  POCT CBC      Result Value Range   WBC 7.6  4.6 - 10.2 K/uL   Lymph, poc 1.0  0.6 - 3.4   POC LYMPH PERCENT 12.7  10 - 50 %L   MID (cbc) 0.6  0 - 0.9   POC MID % 8.4  0 - 12 %M   POC Granulocyte 6.0  2 - 6.9   Granulocyte percent 78.9  37 - 80 %G   RBC 5.00  4.04 - 5.48 M/uL   Hemoglobin 12.5  12.2 - 16.2 g/dL   HCT, POC 47.8  29.5 - 47.9 %   MCV 80.5  80 - 97 fL   MCH, POC 25.0 (*) 27 - 31.2 pg   MCHC 31.1 (*) 31.8 - 35.4 g/dL   RDW, POC 62.1     Platelet Count, POC 264  142 - 424 K/uL   MPV 9.1  0 - 99.8 fL   Assessment:  Suspect early pneumonia  Plan: .  Cough - Plan: DG Chest 2 View, POCT CBC, cefTRIAXone (ROCEPHIN) injection 1 g, levofloxacin (LEVAQUIN) 500 MG tablet, DISCONTINUED: cefTRIAXone (ROCEPHIN) injection 1 g  Fever, unspecified - Plan: DG Chest 2 View, POCT CBC, cefTRIAXone (ROCEPHIN) injection 1 g, levofloxacin (LEVAQUIN) 500 MG tablet, DISCONTINUED: cefTRIAXone (ROCEPHIN) injection 1 g  Pneumonia

## 2013-02-27 ENCOUNTER — Ambulatory Visit (INDEPENDENT_AMBULATORY_CARE_PROVIDER_SITE_OTHER): Payer: BC Managed Care – PPO | Admitting: Physician Assistant

## 2013-02-27 VITALS — BP 106/71 | HR 91 | Temp 98.4°F | Resp 16 | Ht 64.5 in | Wt 188.0 lb

## 2013-02-27 DIAGNOSIS — J189 Pneumonia, unspecified organism: Secondary | ICD-10-CM

## 2013-02-27 NOTE — Progress Notes (Signed)
   [address] [place] [address]   Phone [phone]  Subjective:    Patient ID: Toni Williams, female    DOB: 11-30-1950, 62 y.o.   MRN: [id number]  HPI Pt presents to clinic for recheck on pneumonia.  She feels much better still fatigued and with a slight cough but fevers have resolved and she is starting eat again.  She has tried to hydrate.  She has been taking abx without problems and has 2 more days left.   Review of Systems  Constitutional: Positive for appetite change (decreased appetite) and fatigue. Negative for fever.  Respiratory: Positive for cough. Negative for shortness of breath.        Objective:   Physical Exam  Vitals reviewed. Constitutional: She is oriented to person, place, and time. She appears well-developed and well-nourished.  HENT:  Head: Normocephalic and atraumatic.  Right Ear: External ear normal.  Left Ear: External ear normal.  Eyes: Conjunctivae are normal.  Cardiovascular: Normal rate, regular rhythm and normal heart sounds.   Pulmonary/Chest: Effort normal and breath sounds normal. She has no wheezes. She has no rales.  Neurological: She is alert and oriented to person, place, and time.  Skin: Skin is warm and dry.  Psychiatric: She has a normal mood and affect. Her behavior is normal. Judgment and thought content normal.        Assessment & Plan:  Pneumonia - pt to continue and finish her Abx.  She should hydrate and advance her diet as she tolerates.  She will plan on returning to work on Monday and is she does not feel well enough she should RTC.  Benny Lennert PA-C 02/27/2013 2:47 PM

## 2013-04-25 ENCOUNTER — Other Ambulatory Visit: Payer: Self-pay | Admitting: Family Medicine

## 2013-05-21 ENCOUNTER — Telehealth: Payer: Self-pay | Admitting: Medical Oncology

## 2013-05-21 NOTE — Telephone Encounter (Signed)
LVMOM. Per MD, informed pt that per Dr Renelda Loma notes, she is to continue with tamoxifen till her appt in November with Dr Welton Flakes. Asked patient to return call to confirm receipt.

## 2013-05-21 NOTE — Telephone Encounter (Signed)
Let patient know that per Dr. Donnie Coffin notes she will continue tamoxifen until her next visit with me which is in November  Ask her to keep the appointment as scheduled

## 2013-05-21 NOTE — Telephone Encounter (Signed)
Pt LVMOM requesting to know whether she needs to continue with tamoxifen, states it's been five years since she started.   Also requesting results from bone density scan.  Prior pt of Dr Donnie Coffin. LOV  09/24/2012 Last bone scan 09/19/2012  Next sched visit with labs 09/17/2013, MD 09/24/2013

## 2013-06-21 ENCOUNTER — Ambulatory Visit (INDEPENDENT_AMBULATORY_CARE_PROVIDER_SITE_OTHER): Payer: BC Managed Care – PPO | Admitting: Family Medicine

## 2013-06-21 VITALS — BP 133/87 | HR 74 | Temp 98.3°F | Resp 18 | Wt 194.0 lb

## 2013-06-21 DIAGNOSIS — E559 Vitamin D deficiency, unspecified: Secondary | ICD-10-CM

## 2013-06-21 DIAGNOSIS — J339 Nasal polyp, unspecified: Secondary | ICD-10-CM

## 2013-06-21 DIAGNOSIS — Z79899 Other long term (current) drug therapy: Secondary | ICD-10-CM

## 2013-06-21 DIAGNOSIS — I10 Essential (primary) hypertension: Secondary | ICD-10-CM

## 2013-06-21 DIAGNOSIS — L659 Nonscarring hair loss, unspecified: Secondary | ICD-10-CM

## 2013-06-21 LAB — BASIC METABOLIC PANEL
CO2: 28 mEq/L (ref 19–32)
Calcium: 9.4 mg/dL (ref 8.4–10.5)
Creat: 0.74 mg/dL (ref 0.50–1.10)
Sodium: 141 mEq/L (ref 135–145)

## 2013-06-21 LAB — POCT CBC
Hemoglobin: 12.8 g/dL (ref 12.2–16.2)
Lymph, poc: 2.3 (ref 0.6–3.4)
MCH, POC: 26.5 pg — AB (ref 27–31.2)
MCHC: 31.5 g/dL — AB (ref 31.8–35.4)
MID (cbc): 0.7 (ref 0–0.9)
MPV: 8.5 fL (ref 0–99.8)
POC Granulocyte: 2.8 (ref 2–6.9)
POC MID %: 11.9 %M (ref 0–12)
Platelet Count, POC: 280 10*3/uL (ref 142–424)
RDW, POC: 14.9 %

## 2013-06-21 MED ORDER — HYDROCHLOROTHIAZIDE 25 MG PO TABS: 25.0000 mg | ORAL_TABLET | Freq: Every day | ORAL | Status: DC

## 2013-06-21 NOTE — Progress Notes (Signed)
Subjective:    Patient ID: Toni Williams, female    DOB: [birth year], 62 y.o.   MRN: [id number] Chief Complaint  Patient presents with  . Hypertension    med refills   HPI  Has home bp cuff but does not use.  Ran out of hctz sev mos ago and is here for a refill.  Past Medical History  Diagnosis Date  . Cancer   . Missed abortion     x 1  . Termination of pregnancy     x 1  . Seasonal allergies   . Hypertension   . Blood transfusion 1979    with missed abortion in [facility] -Dr Mikey Bussing  . Anxiety     no meds   Current Outpatient Prescriptions on File Prior to Visit  Medication Sig Dispense Refill  . cholecalciferol (VITAMIN D) 1000 UNITS tablet Take 2,000 Units by mouth daily.        . tamoxifen (NOLVADEX) 20 MG tablet Take 1 tablet (20 mg total) by mouth daily.  90 tablet  3  . calcium carbonate (OS-CAL) 600 MG TABS Take 600 mg by mouth daily.        . Triamcinolone Acetonide (TRIAMCINOLONE 0.1 % CREAM : EUCERIN) CREA Apply 1 application topically 3 (three) times daily. For all areas except face  1 each  11   No current facility-administered medications on file prior to visit.   Allergies  Allergen Reactions  . Morphine And Related Other (See Comments)    "psychotic" reaction per pt report  . Latex Rash    itching     Review of Systems  Constitutional: Positive for fatigue. Negative for fever, chills, diaphoresis and appetite change.  HENT: Positive for congestion and rhinorrhea.   Eyes: Negative for visual disturbance.  Respiratory: Negative for cough and shortness of breath.   Cardiovascular: Negative for chest pain, palpitations and leg swelling.  Genitourinary: Negative for decreased urine volume.  Neurological: Negative for syncope and headaches.  Hematological: Does not bruise/bleed easily.      BP 133/87  Pulse 74  Temp(Src) 98.3 F (36.8 C) (Oral)  Resp 18  Wt 194 lb (87.998 kg)  BMI 32.8 kg/m2 Objective:   Physical Exam  Constitutional:  She is oriented to person, place, and time. She appears well-developed and well-nourished. No distress.  HENT:  Head: Normocephalic and atraumatic.  Right Ear: External ear normal.  Left Ear: External ear normal.  Eyes: Conjunctivae are normal. No scleral icterus.  Neck: Normal range of motion. Neck supple. No thyromegaly present.  Cardiovascular: Normal rate, regular rhythm, normal heart sounds and intact distal pulses.   Pulmonary/Chest: Effort normal and breath sounds normal. No respiratory distress.  Musculoskeletal: She exhibits no edema.  Lymphadenopathy:    She has no cervical adenopathy.  Neurological: She is alert and oriented to person, place, and time.  Skin: Skin is warm and dry. She is not diaphoretic. No erythema.  Psychiatric: She has a normal mood and affect. Her behavior is normal.          Assessment & Plan:  Hypertension - Plan: POCT CBC, Basic metabolic panel - restart hctz. Make sure you are getting plenty of potassium in your diet.  Vitamin D deficiency - Plan: POCT CBC, Basic metabolic panel  Nasal polyp - Plan: POCT CBC, Basic metabolic panel - cont flonase and astelin from ENT.  Hair loss - Plan: POCT CBC, Basic metabolic panel  Meds ordered this encounter  Medications  . azelastine (  ASTELIN) 137 MCG/SPRAY nasal spray    Sig: Place 1 spray into the nose 2 (two) times daily. Use in each nostril as directed  . fluticasone (FLONASE) 50 MCG/ACT nasal spray    Sig: Place 2 sprays into the nose daily.  . hydrochlorothiazide (HYDRODIURIL) 25 MG tablet    Sig: Take 1 tablet (25 mg total) by mouth daily. PATIENT NEEDS OFFICE VISIT/LABS FOR ADDITIONAL REFILLS    Dispense:  90 tablet    Refill:  3

## 2013-06-21 NOTE — Patient Instructions (Addendum)
Foods Rich in Potassium Food / Potassium (mg)  Apricots, dried,  cup / 378 mg   Apricots, raw, 1 cup halves / 401 mg   Avocado,  / 487 mg   Banana, 1 large / 487 mg   Beef, lean, round, 3 oz / 202 mg   Cantaloupe, 1 cup cubes / 427 mg   Dates, medjool, 5 whole / 835 mg   Ham, cured, 3 oz / 212 mg   Lentils, dried,  cup / 458 mg   Lima beans, frozen,  cup / 258 mg   Orange, 1 large / 333 mg   Orange juice, 1 cup / 443 mg   Peaches, dried,  cup / 398 mg   Peas, split, cooked,  cup / 355 mg   Potato, boiled, 1 medium / 515 mg   Prunes, dried, uncooked,  cup / 318 mg   Raisins,  cup / 309 mg   Salmon, pink, raw, 3 oz / 275 mg   Sardines, canned , 3 oz / 338 mg   Tomato, raw, 1 medium / 292 mg   Tomato juice, 6 oz / 417 mg   Turkey, 3 oz / 349 mg  Document Released: 10/16/2005 Document Revised: 06/28/2011 Document Reviewed: 03/01/2009 Exit[employer] Patient Information 2012 [employer]. 

## 2013-06-29 ENCOUNTER — Encounter: Payer: Self-pay | Admitting: Family Medicine

## 2013-07-29 ENCOUNTER — Telehealth: Payer: Self-pay | Admitting: Oncology

## 2013-08-28 ENCOUNTER — Ambulatory Visit (INDEPENDENT_AMBULATORY_CARE_PROVIDER_SITE_OTHER): Payer: BC Managed Care – PPO | Admitting: Emergency Medicine

## 2013-08-28 VITALS — BP 120/76 | HR 86 | Temp 98.7°F | Resp 18 | Ht 64.0 in | Wt 195.0 lb

## 2013-08-28 DIAGNOSIS — L309 Dermatitis, unspecified: Secondary | ICD-10-CM

## 2013-08-28 DIAGNOSIS — J209 Acute bronchitis, unspecified: Secondary | ICD-10-CM

## 2013-08-28 DIAGNOSIS — L259 Unspecified contact dermatitis, unspecified cause: Secondary | ICD-10-CM

## 2013-08-28 MED ORDER — AZITHROMYCIN 250 MG PO TABS: ORAL_TABLET | ORAL | Status: DC

## 2013-08-28 MED ORDER — HYDROCOD POLST-CHLORPHEN POLST 10-8 MG/5ML PO LQCR: 5.0000 mL | Freq: Two times a day (BID) | ORAL | Status: DC | PRN

## 2013-08-28 MED ORDER — TRIAMCINOLONE 0.1 % CREAM:EUCERIN CREAM 1:1: 1.0000 "application " | TOPICAL_CREAM | Freq: Three times a day (TID) | CUTANEOUS | Status: DC

## 2013-08-28 NOTE — Progress Notes (Signed)
[facility] [address], [place] [address] [phone]  Date:  08/28/2013   Name:  Toni Williams   DOB:  1951-04-29   MRN:  [id number]  PCP:  Tonye Pearson, MD    Chief Complaint: Cough and Rash   History of Present Illness:  Toni Williams is a 62 y.o. very pleasant female patient who presents with the following:  Ill with sudden onset of cough and rash on her face Sunday.  Denies coryza, wheezing or shortness of breath.  No fever or chills. No nausea or vomiting.  Thinks the rash may be related to new lipstick.  Not on ARB or ACE.  No improvement with over the counter medications or other home remedies. Denies other complaint or health concern today.   Patient Active Problem List   Diagnosis Date Noted  . Chest pain 05/17/2012  . Abnormal resting ECG findings 04/24/2012  . Vitamin d deficiency 04/24/2012  . Hypertension 02/11/2012  . Breast cancer 02/11/2012  . Eczema 02/11/2012  . Allergic rhinitis due to allergen 02/11/2012    Past Medical History  Diagnosis Date  . Cancer   . Missed abortion     x 1  . Termination of pregnancy     x 1  . Seasonal allergies   . Hypertension   . Blood transfusion 1979    with missed abortion in [facility] -Dr Mikey Bussing  . Anxiety     no meds    Past Surgical History  Procedure Laterality Date  . Cholecystectomy    . Wisdom tooth extraction    . Cesarean section    . Colonscopy    . Dilation and curettage of uterus    . Breast surgery  10/03/2007, 11/06/2007    Left breast excision, re-excision Left breast  . Breast surgery      No IV sticks/Blood Draws/Blood Pressure Left Arm,  . Hysteroscopy w/d&c  10/27/2011    Procedure: DILATATION AND CURETTAGE /HYSTEROSCOPY;  Surgeon: Purcell Nails, MD;  Location: [facility];  Service: Gynecology;  Laterality: N/A;    History  Substance Use Topics  . Smoking status: Former Smoker -- 0.25 packs/day for 2 years    Types: Cigarettes    Quit  date: 10/30/1976  . Smokeless tobacco: Never Used  . Alcohol Use: No    Family History  Problem Relation Age of Onset  . Cancer Mother 72    breast (R),stomach, and uterine  . Cancer Father 50    lung from smoking  3 pks/day  . Diabetes Maternal Grandmother     amputation of (L) leg  . Stroke Maternal Grandfather 60  . Diabetes Brother     Allergies  Allergen Reactions  . Morphine And Related Other (See Comments)    "psychotic" reaction per pt report  . Latex Rash    itching    Medication list has been reviewed and updated.  Current Outpatient Prescriptions on File Prior to Visit  Medication Sig Dispense Refill  . azelastine (ASTELIN) 137 MCG/SPRAY nasal spray Place 1 spray into the nose 2 (two) times daily. Use in each nostril as directed      . cholecalciferol (VITAMIN D) 1000 UNITS tablet Take 2,000 Units by mouth daily.        . fluticasone (FLONASE) 50 MCG/ACT nasal spray Place 2 sprays into the nose daily.      . hydrochlorothiazide (HYDRODIURIL) 25 MG tablet Take 1 tablet (25 mg total) by  mouth daily. PATIENT NEEDS OFFICE VISIT/LABS FOR ADDITIONAL REFILLS  90 tablet  3  . tamoxifen (NOLVADEX) 20 MG tablet Take 1 tablet (20 mg total) by mouth daily.  90 tablet  3  . Triamcinolone Acetonide (TRIAMCINOLONE 0.1 % CREAM : EUCERIN) CREA Apply 1 application topically 3 (three) times daily. For all areas except face  1 each  11  . calcium carbonate (OS-CAL) 600 MG TABS Take 600 mg by mouth daily.         No current facility-administered medications on file prior to visit.    Review of Systems:  As per HPI, otherwise negative.    Physical Examination: Filed Vitals:   08/28/13 1935  BP: 120/76  Pulse: 86  Temp: 98.7 F (37.1 C)  Resp: 18   Filed Vitals:   08/28/13 1935  Height: 5\' 4"  (1.626 m)  Weight: 195 lb (88.451 kg)   Body mass index is 33.46 kg/(m^2). Ideal Body Weight: Weight in (lb) to have BMI = 25: 145.3  GEN: WDWN, NAD, Non-toxic, A & O x  3 HEENT: Atraumatic, Normocephalic. Neck supple. No masses, No LAD. Ears and Nose: No external deformity. CV: RRR, No M/G/R. No JVD. No thrill. No extra heart sounds. PULM: CTA B, no wheezes, crackles, rhonchi. No retractions. No resp. distress. No accessory muscle use. ABD: S, NT, ND, +BS. No rebound. No HSM. EXTR: No c/c/e NEURO Normal gait.  PSYCH: Normally interactive. Conversant. Not depressed or anxious appearing.  Calm demeanor.  SKIN:  Rash on face is eczematoid   Assessment and Plan: Bronchitis Eczema TAC  Signed,  Phillips Odor, MD

## 2013-08-28 NOTE — Patient Instructions (Addendum)
nEczema Atopic dermatitis, or eczema, is an inherited type of sensitive skin. Often people with eczema have a family history of allergies, asthma, or hay fever. It causes a red itchy rash and dry scaly skin. The itchiness may occur before the skin rash and may be very intense. It is not contagious. Eczema is generally worse during the cooler winter months and often improves with the warmth of summer. Eczema usually starts showing signs in infancy. Some children outgrow eczema, but it may last through adulthood. Flare-ups may be caused by:  Eating something or contact with something you are sensitive or allergic to.  Stress. DIAGNOSIS  The diagnosis of eczema is usually based upon symptoms and medical history. TREATMENT  Eczema cannot be cured, but symptoms usually can be controlled with treatment or avoidance of allergens (things to which you are sensitive or allergic to).  Controlling the itching and scratching.  Use over-the-counter antihistamines as directed for itching. It is especially useful at night when the itching tends to be worse.  Use over-the-counter steroid creams as directed for itching.  Scratching makes the rash and itching worse and may cause impetigo (a skin infection) if fingernails are contaminated (dirty).  Keeping the skin well moisturized with creams every day. This will seal in moisture and help prevent dryness. Lotions containing alcohol and water can dry the skin and are not recommended.  Limiting exposure to allergens.  Recognizing situations that cause stress.  Developing a plan to manage stress. HOME CARE INSTRUCTIONS   Take prescription and over-the-counter medicines as directed by your caregiver.  Do not use anything on the skin without checking with your caregiver.  Keep baths or showers short (5 minutes) in warm (not hot) water. Use mild cleansers for bathing. You may add non-perfumed bath oil to the bath water. It is best to avoid soap and bubble  bath.  Immediately after a bath or shower, when the skin is still damp, apply a moisturizing ointment to the entire body. This ointment should be a petroleum ointment. This will seal in moisture and help prevent dryness. The thicker the ointment the better. These should be unscented.  Keep fingernails cut short and wash hands often. If your child has eczema, it may be necessary to put soft gloves or mittens on your child at night.  Dress in clothes made of cotton or cotton blends. Dress lightly, as heat increases itching.  Avoid foods that may cause flare-ups. Common foods include cow's milk, peanut butter, eggs and wheat.  Keep a child with eczema away from anyone with fever blisters. The virus that causes fever blisters (herpes simplex) can cause a serious skin infection in children with eczema. SEEK MEDICAL CARE IF:   Itching interferes with sleep.  The rash gets worse or is not better within one week following treatment.  The rash looks infected (pus or soft yellow scabs).  You or your child has an oral temperature above 102 F (38.9 C).  Your baby is older than 3 months with a rectal temperature of 100.5 F (38.1 C) or higher for more than 1 day.  The rash flares up after contact with someone who has fever blisters. SEEK IMMEDIATE MEDICAL CARE IF:   Your baby is older than 3 months with a rectal temperature of 102 F (38.9 C) or higher.  Your baby is older than 3 months or younger with a rectal temperature of 100.4 F (38 C) or higher. Document Released: 10/13/2000 Document Revised: 01/08/2012 Document Reviewed: 08/18/2009 ExitCare  Patient Information 2014 [place], [place]. Bronchitis Bronchitis is the body's way of reacting to injury and/or infection (inflammation) of the bronchi. Bronchi are the air tubes that extend from the windpipe into the lungs. If the inflammation becomes severe, it may cause shortness of breath. CAUSES  Inflammation may be caused by:  A  virus.  Germs (bacteria).  Dust.  Allergens.  Pollutants and many other irritants. The cells lining the bronchial tree are covered with tiny hairs (cilia). These constantly beat upward, away from the lungs, toward the mouth. This keeps the lungs free of pollutants. When these cells become too irritated and are unable to do their job, mucus begins to develop. This causes the characteristic cough of bronchitis. The cough clears the lungs when the cilia are unable to do their job. Without either of these protective mechanisms, the mucus would settle in the lungs. Then you would develop pneumonia. Smoking is a common cause of bronchitis and can contribute to pneumonia. Stopping this habit is the single most important thing you can do to help yourself. TREATMENT   Your caregiver may prescribe an antibiotic if the cough is caused by bacteria. Also, medicines that open up your airways make it easier to breathe. Your caregiver may also recommend or prescribe an expectorant. It will loosen the mucus to be coughed up. Only take over-the-counter or prescription medicines for pain, discomfort, or fever as directed by your caregiver.  Removing whatever causes the problem (smoking, for example) is critical to preventing the problem from getting worse.  Cough suppressants may be prescribed for relief of cough symptoms.  Inhaled medicines may be prescribed to help with symptoms now and to help prevent problems from returning.  For those with recurrent (chronic) bronchitis, there may be a need for steroid medicines. SEEK IMMEDIATE MEDICAL CARE IF:   During treatment, you develop more pus-like mucus (purulent sputum).  You have a fever.  Your baby is older than 3 months with a rectal temperature of 102 F (38.9 C) or higher.  Your baby is 17 months old or younger with a rectal temperature of 100.4 F (38 C) or higher.  You become progressively more ill.  You have increased difficulty breathing,  wheezing, or shortness of breath. It is necessary to seek immediate medical care if you are elderly or sick from any other disease. MAKE SURE YOU:   Understand these instructions.  Will watch your condition.  Will get help right away if you are not doing well or get worse. Document Released: 10/16/2005 Document Revised: 01/08/2012 Document Reviewed: 08/25/2008 [facility] Patient Information 2014 [place], [place].

## 2013-09-17 ENCOUNTER — Other Ambulatory Visit: Payer: BC Managed Care – PPO

## 2013-09-23 ENCOUNTER — Other Ambulatory Visit: Payer: Self-pay | Admitting: *Deleted

## 2013-09-23 ENCOUNTER — Other Ambulatory Visit (HOSPITAL_BASED_OUTPATIENT_CLINIC_OR_DEPARTMENT_OTHER): Payer: BC Managed Care – PPO | Admitting: Lab

## 2013-09-23 DIAGNOSIS — C50919 Malignant neoplasm of unspecified site of unspecified female breast: Secondary | ICD-10-CM

## 2013-09-23 LAB — CBC WITH DIFFERENTIAL/PLATELET
Basophils Absolute: 0 10*3/uL (ref 0.0–0.1)
EOS%: 16.1 % — ABNORMAL HIGH (ref 0.0–7.0)
Eosinophils Absolute: 1.1 10*3/uL — ABNORMAL HIGH (ref 0.0–0.5)
HCT: 37.4 % (ref 34.8–46.6)
HGB: 12.3 g/dL (ref 11.6–15.9)
MONO#: 0.7 10*3/uL (ref 0.1–0.9)
NEUT#: 2.1 10*3/uL (ref 1.5–6.5)
NEUT%: 31.2 % — ABNORMAL LOW (ref 38.4–76.8)
RDW: 14.6 % — ABNORMAL HIGH (ref 11.2–14.5)
WBC: 6.8 10*3/uL (ref 3.9–10.3)
lymph#: 2.9 10*3/uL (ref 0.9–3.3)

## 2013-09-23 LAB — COMPREHENSIVE METABOLIC PANEL (CC13)
AST: 35 U/L — ABNORMAL HIGH (ref 5–34)
Albumin: 3.4 g/dL — ABNORMAL LOW (ref 3.5–5.0)
Anion Gap: 12 mEq/L — ABNORMAL HIGH (ref 3–11)
BUN: 11.3 mg/dL (ref 7.0–26.0)
CO2: 24 mEq/L (ref 22–29)
Calcium: 9.4 mg/dL (ref 8.4–10.4)
Chloride: 107 mEq/L (ref 98–109)
Creatinine: 0.7 mg/dL (ref 0.6–1.1)
Potassium: 3.7 mEq/L (ref 3.5–5.1)

## 2013-09-24 ENCOUNTER — Ambulatory Visit: Payer: BC Managed Care – PPO | Admitting: Oncology

## 2013-09-30 ENCOUNTER — Telehealth: Payer: Self-pay | Admitting: *Deleted

## 2013-09-30 ENCOUNTER — Ambulatory Visit (HOSPITAL_BASED_OUTPATIENT_CLINIC_OR_DEPARTMENT_OTHER): Payer: BC Managed Care – PPO | Admitting: Oncology

## 2013-09-30 ENCOUNTER — Encounter (INDEPENDENT_AMBULATORY_CARE_PROVIDER_SITE_OTHER): Payer: Self-pay

## 2013-09-30 ENCOUNTER — Encounter: Payer: Self-pay | Admitting: Oncology

## 2013-09-30 VITALS — BP 125/82 | HR 106 | Temp 98.3°F | Resp 18 | Ht 64.0 in | Wt 196.0 lb

## 2013-09-30 DIAGNOSIS — Z853 Personal history of malignant neoplasm of breast: Secondary | ICD-10-CM

## 2013-09-30 DIAGNOSIS — E559 Vitamin D deficiency, unspecified: Secondary | ICD-10-CM

## 2013-09-30 DIAGNOSIS — C50919 Malignant neoplasm of unspecified site of unspecified female breast: Secondary | ICD-10-CM

## 2013-09-30 NOTE — Telephone Encounter (Signed)
appts made and printed...td 

## 2013-09-30 NOTE — Progress Notes (Signed)
OFFICE PROGRESS NOTE  CC**  Toni Pearson, MD [address] 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  #2 she then went on to complete radiation therapy 02/03/2008.  #3 this tumor was ER positive positive patient  placed on tamoxifen by Dr. Donnie Coffin. She has now completed 5 years of tamoxifen 12/2/ 2014.  CURRENT THERAPY: Observation  INTERVAL HISTORY: Toni Williams 62 y.o. female returns for followup visit today. She has been seeing Dr. Beatris Ship on an annual basis. This is now referred here of tamoxifen. She is asking to come off of the overall she tolerated tamoxifen very nicely without any significant side effects she denies any nausea vomiting fevers chills night sweats headaches shortness of breath chest pains palpitations no myalgias and arthralgias no vaginal discharge. Remainder of the 10 point review of systems is negative.  MEDICAL HISTORY: Past Medical History  Diagnosis Date  . Cancer   . Missed abortion     x 1  . Termination of pregnancy     x 1  . Seasonal allergies   . Hypertension   . Blood transfusion 1979    with missed abortion in [facility] -Dr Mikey Bussing  . Anxiety     no meds    ALLERGIES:  is allergic to morphine and related and latex.  MEDICATIONS:  Current Outpatient Prescriptions  Medication Sig Dispense Refill  . azelastine (ASTELIN) 137 MCG/SPRAY nasal spray Place 1 spray into the nose 2 (two) times daily. Use in each nostril as directed      . calcium carbonate (OS-CAL) 600 MG TABS Take 600 mg by mouth daily.        . cholecalciferol (VITAMIN D) 1000 UNITS tablet Take 2,000 Units by mouth daily.        . fluticasone (FLONASE) 50 MCG/ACT nasal spray Place 2 sprays into the nose daily.      . hydrochlorothiazide (HYDRODIURIL) 25 MG tablet Take 1 tablet (25 mg total) by mouth daily. PATIENT NEEDS OFFICE  VISIT/LABS FOR ADDITIONAL REFILLS  90 tablet  3  . tamoxifen (NOLVADEX) 20 MG tablet Take 1 tablet (20 mg total) by mouth daily.  90 tablet  3  . chlorpheniramine-HYDROcodone (TUSSIONEX PENNKINETIC ER) 10-8 MG/5ML LQCR Take 5 mLs by mouth every 12 (twelve) hours as needed.  60 mL  0  . Triamcinolone Acetonide (TRIAMCINOLONE 0.1 % CREAM : EUCERIN) CREA Apply 1 application topically 3 (three) times daily. For all areas except face  1 each  11   No current facility-administered medications for this visit.    SURGICAL HISTORY:  Past Surgical History  Procedure Laterality Date  . Cholecystectomy    . Wisdom tooth extraction    . Cesarean section    . Colonscopy    . Dilation and curettage of uterus    . Breast surgery  10/03/2007, 11/06/2007    Left breast excision, re-excision Left breast  . Breast surgery      No IV sticks/Blood Draws/Blood Pressure Left Arm,  . Hysteroscopy w/d&c  10/27/2011    Procedure: DILATATION AND CURETTAGE /HYSTEROSCOPY;  Surgeon: Purcell Nails, MD;  Location: [facility];  Service: Gynecology;  Laterality: N/A;    REVIEW OF SYSTEMS:  A comprehensive review of systems was negative.   HEALTH MAINTENANCE:  PHYSICAL EXAMINATION: Blood pressure 125/82, pulse 106, temperature 98.3 F (36.8 C), temperature source Oral, resp.  rate 18, height 5\' 4"  (1.626 m), weight 196 lb (88.905 kg). Body mass index is 33.63 kg/(m^2). ECOG PERFORMANCE STATUS: 0 - Asymptomatic   General appearance: alert, cooperative and appears stated age Lymph nodes: Cervical, supraclavicular, and axillary nodes normal. Resp: clear to auscultation bilaterally Cardio: regular rate and rhythm GI: soft, non-tender; bowel sounds normal; no masses,  no organomegaly Extremities: extremities normal, atraumatic, no cyanosis or edema Neurologic: Grossly normal   LABORATORY DATA: Lab Results  Component Value Date   WBC 6.8 09/23/2013   HGB 12.3 09/23/2013   HCT 37.4 09/23/2013   MCV 79.5 09/23/2013    PLT 271 09/23/2013      Chemistry      Component Value Date/Time   NA 143 09/23/2013 0909   NA 141 06/21/2013 0923   K 3.7 09/23/2013 0909   K 4.2 06/21/2013 0923   CL 105 06/21/2013 0923   CL 105 09/17/2012 1342   CO2 24 09/23/2013 0909   CO2 28 06/21/2013 0923   BUN 11.3 09/23/2013 0909   BUN 13 06/21/2013 0923   CREATININE 0.7 09/23/2013 0909   CREATININE 0.74 06/21/2013 0923   CREATININE 0.71 10/18/2011 1217      Component Value Date/Time   CALCIUM 9.4 09/23/2013 0909   CALCIUM 9.4 06/21/2013 0923   ALKPHOS 55 09/23/2013 0909   ALKPHOS 53 04/24/2012 1032   AST 35* 09/23/2013 0909   AST 19 04/24/2012 1032   ALT 30 09/23/2013 0909   ALT 15 04/24/2012 1032   BILITOT 0.55 09/23/2013 0909   BILITOT 0.5 04/24/2012 1032       RADIOGRAPHIC STUDIES:  No results found.  ASSESSMENT: 62 year old female with history DCIS of the left breast status post lumpectomy and a 12 radiation therapy. Patient has now completed 5 years of tamoxifen as chemoprevention. She has no evidence of disease. She is up-to-date on her mammograms. Patient is exercising and trying to 8 healthy. I have encouraged her to continue healthy lifestyle. She will continue getting her mammograms on a yearly basis, follow her primary care physician, as well as her gynecologist. I will plan on seeing her on a yearly basis to finish out 10 years of followup oncology.   PLAN:   #1 continue seeing him on a yearly basis.  #2 patient is to call me with any problems.   All questions were answered. The patient knows to call the clinic with any problems, questions or concerns. We can certainly see the patient much sooner if necessary.  I spent 25 minutes counseling the patient face to face. The total time spent in the appointment was 30 minutes.    Drue Second, MD Medical/Oncology [facility] [phone] (beeper) [phone] (Office)  09/30/2013, 10:16 AM

## 2013-10-13 ENCOUNTER — Encounter: Payer: Self-pay | Admitting: Oncology

## 2013-10-17 ENCOUNTER — Encounter: Payer: Self-pay | Admitting: Oncology

## 2013-10-26 ENCOUNTER — Ambulatory Visit (INDEPENDENT_AMBULATORY_CARE_PROVIDER_SITE_OTHER): Payer: BC Managed Care – PPO | Admitting: Internal Medicine

## 2013-10-26 VITALS — BP 122/80 | HR 87 | Temp 98.1°F | Resp 16 | Ht 65.5 in | Wt 196.0 lb

## 2013-10-26 DIAGNOSIS — S335XXA Sprain of ligaments of lumbar spine, initial encounter: Secondary | ICD-10-CM

## 2013-10-26 DIAGNOSIS — S39012A Strain of muscle, fascia and tendon of lower back, initial encounter: Secondary | ICD-10-CM

## 2013-10-26 DIAGNOSIS — R05 Cough: Secondary | ICD-10-CM

## 2013-10-26 MED ORDER — CYCLOBENZAPRINE HCL 10 MG PO TABS: 10.0000 mg | ORAL_TABLET | Freq: Every day | ORAL | Status: DC

## 2013-10-26 MED ORDER — MELOXICAM 15 MG PO TABS: 15.0000 mg | ORAL_TABLET | Freq: Every day | ORAL | Status: DC

## 2013-10-26 MED ORDER — HYDROCODONE-HOMATROPINE 5-1.5 MG/5ML PO SYRP: 5.0000 mL | ORAL_SOLUTION | Freq: Four times a day (QID) | ORAL | Status: DC | PRN

## 2013-10-26 NOTE — Progress Notes (Signed)
Subjective:    Patient ID: Toni Williams, female    DOB: 1951/10/16, 62 y.o.   MRN: [id number]  HPI This chart was scribed for Ellamae Sia by Smiley Houseman, Scribe. This patient was seen in room 10 and the patient's care was started at 4:13 PM.  HPI Comments: Toni Williams is a 62 y.o. female who presents to the [facility] complaining of constant moderate lower back pain that occurred 4 days ago as she was picking up a bucket of water.  She states the pain does not radiate into her legs, except for when the injury occurred.  Pt reports that pain is worsened when getting in and out of car, rolling over in bed, and twisting her torso.  She denies that abduction and adduction of the arms and leg raise worsens the pain.  She denies any previous injury to her lower back.    She also complains of a persistent non-productive cough, with associate SOB that hasn't subsided since she was diagnosed with bronchitis on her last visit.  She takes azelastine nasal spray without relief.       Past Surgical History  Procedure Laterality Date   Cholecystectomy     Wisdom tooth extraction     Cesarean section     Colonscopy     Dilation and curettage of uterus     Breast surgery  10/03/2007, 11/06/2007    Left breast excision, re-excision Left breast   Breast surgery      No IV sticks/Blood Draws/Blood Pressure Left Arm,   Hysteroscopy w/d&c  10/27/2011    Procedure: DILATATION AND CURETTAGE /HYSTEROSCOPY;  Surgeon: Purcell Nails, MD;  Location: [facility];  Service: Gynecology;  Laterality: N/A;    Family History  Problem Relation Age of Onset   Cancer Mother 48    breast (R),stomach, and uterine   Cancer Father 98    lung from smoking  3 pks/day   Diabetes Maternal Grandmother     amputation of (L) leg   Stroke Maternal Grandfather 26   Diabetes Brother     History   Social History   Marital Status: Married    Spouse Name: N/A    Number  of Children: N/A   Years of Education: N/A   Occupational History   Not on file.   Social History Main Topics   Smoking status: Former Smoker -- 0.25 packs/day for 2 years    Types: Cigarettes    Quit date: 10/30/1976   Smokeless tobacco: Never Used   Alcohol Use: No   Drug Use: No   Sexual Activity: Not Currently    Birth Control/ Protection: None   Other Topics Concern   Not on file   Social History Narrative   No narrative on file    Allergies  Allergen Reactions   Morphine And Related Other (See Comments)    "psychotic" reaction per pt report   Latex Rash    itching    Patient Active Problem List   Diagnosis Date Noted   Chest pain 05/17/2012   Abnormal resting ECG findings 04/24/2012   Vitamin d deficiency 04/24/2012   Hypertension 02/11/2012   Breast cancer 02/11/2012   Eczema 02/11/2012   Allergic rhinitis due to allergen 02/11/2012     Review of Systems  Constitutional: Negative for fever and chills.  HENT: Negative for congestion and rhinorrhea.   Respiratory: Positive for cough and shortness of breath.   Cardiovascular: Negative for chest  pain.  Gastrointestinal: Negative for nausea, vomiting, abdominal pain and diarrhea.  Musculoskeletal: Positive for back pain (lower back).  Skin: Negative for color change and rash.  Neurological: Negative for syncope.       Objective:   Physical Exam  Nursing note and vitals reviewed. Constitutional: She is oriented to person, place, and time. She appears well-developed and well-nourished. No distress.  HENT:  Head: Normocephalic and atraumatic.  Eyes: Conjunctivae and EOM are normal. Right eye exhibits no discharge. Left eye exhibits no discharge.  Neck: Normal range of motion.  Cardiovascular: Normal rate and regular rhythm.   Pulmonary/Chest: Effort normal and breath sounds normal. No respiratory distress. She has no wheezes.  Musculoskeletal:  Tender R lumbar area Neg SLR Pain mild  w/flex, worse with twist DTRs intact No sens or motor losses  Neurological: She is alert and oriented to person, place, and time.  Skin: Skin is warm and dry.  Psychiatric: She has a normal mood and affect. Her behavior is normal.    Triage Vitals: BP 122/80   Pulse 87   Temp(Src) 98.1 F (36.7 C) (Oral)   Resp 16   Ht 5' 5.5" (1.664 m)   Wt 196 lb (88.905 kg)   BMI 32.11 kg/m2   SpO2 99%  DIAGNOSTIC STUDIES: Oxygen Saturation is 99% on RA, normal by my interpretation.    COORDINATION OF CARE: 4:20 PM-Patient informed of current plan of treatment and evaluation and agrees with plan.       Assessment & Plan:  Lumbar strain, initial encounter  Cough-viral URI   Meds ordered this encounter  Medications   cyclobenzaprine (FLEXERIL) 10 MG tablet    Sig: Take 1 tablet (10 mg total) by mouth at bedtime.    Dispense:  30 tablet    Refill:  0   meloxicam (MOBIC) 15 MG tablet    Sig: Take 1 tablet (15 mg total) by mouth daily.    Dispense:  30 tablet    Refill:  0   HYDROcodone-homatropine (HYCODAN) 5-1.5 MG/5ML syrup    Sig: Take 5 mLs by mouth every 6 (six) hours as needed for cough.    Dispense:  120 mL    Refill:  0   F/u 2 weeks--xrays if not well Stretching ex given  I have completed the patient encounter in its entirety as documented by the scribe, with editing by me where necessary. Robert P. Merla Riches, M.D.

## 2014-01-04 ENCOUNTER — Ambulatory Visit: Payer: BC Managed Care – PPO | Admitting: Physician Assistant

## 2014-01-04 VITALS — BP 110/70 | HR 65 | Temp 98.7°F | Resp 16 | Ht 65.0 in | Wt 195.0 lb

## 2014-01-04 DIAGNOSIS — L259 Unspecified contact dermatitis, unspecified cause: Secondary | ICD-10-CM

## 2014-01-04 DIAGNOSIS — L309 Dermatitis, unspecified: Secondary | ICD-10-CM

## 2014-01-04 MED ORDER — CLOBETASOL PROPIONATE 0.05 % EX OINT: 1.0000 "application " | TOPICAL_OINTMENT | Freq: Two times a day (BID) | CUTANEOUS | Status: DC

## 2014-01-04 NOTE — Progress Notes (Signed)
Subjective:    Patient ID: Toni Williams, female    DOB: 12-05-1950, 63 y.o.   MRN: [id number]  HPI Primary Physician: Leandrew Koyanagi, MD  Chief Complaint: Medication refill  HPI: 63 y.o. female with history below presents for refill of clobetasol. Patient with known eczema. Had a flare up along her bilateral hands about one week ago. She did not have any medication at home to help treat this. Today she began to notice some cracking along some of the digits. No erythema. Afebrile. No chills. She states the clobetasol ointment is the only thing that works for her. The creams do not work for her.    Past Medical History  Diagnosis Date  . Cancer   . Missed abortion     x 1  . Termination of pregnancy     x 1  . Seasonal allergies   . Hypertension   . Blood transfusion 1979    with missed abortion in [facility] -Dr Heber Shepherd  . Anxiety     no meds     Home Meds: Prior to Admission medications   Medication Sig Start Date End Date Taking? Authorizing Provider  azelastine (ASTELIN) 137 MCG/SPRAY nasal spray Place 1 spray into the nose 2 (two) times daily. Use in each nostril as directed   Yes Historical Provider, MD  cholecalciferol (VITAMIN D) 1000 UNITS tablet Take 2,000 Units by mouth daily.     Yes Historical Provider, MD  fluticasone (FLONASE) 50 MCG/ACT nasal spray Place 2 sprays into the nose daily.   Yes Historical Provider, MD  hydrochlorothiazide (HYDRODIURIL) 25 MG tablet Take 1 tablet (25 mg total) by mouth daily. PATIENT NEEDS OFFICE VISIT/LABS FOR ADDITIONAL REFILLS 06/21/13  Yes Shawnee Knapp, MD                                Allergies:  Allergies  Allergen Reactions  . Morphine And Related Other (See Comments)    "psychotic" reaction per pt report  . Latex Rash    itching    History   Social History  . Marital Status: Married    Spouse Name: N/A    Number of Children: N/A  . Years of Education: N/A   Occupational History  . Not on file.    Social History Main Topics  . Smoking status: Former Smoker -- 0.25 packs/day for 2 years    Types: Cigarettes    Quit date: 10/30/1976  . Smokeless tobacco: Never Used  . Alcohol Use: No  . Drug Use: No  . Sexual Activity: Not Currently    Birth Control/ Protection: None   Other Topics Concern  . Not on file   Social History Narrative  . No narrative on file     Review of Systems  Constitutional: Negative for fever, chills and fatigue.  Skin: Positive for color change and rash. Negative for wound.       Objective:   Physical Exam  Physical Exam: Blood pressure 110/70, pulse 65, temperature 98.7 F (37.1 C), temperature source Oral, resp. rate 16, height 5\' 5"  (1.651 m), weight 195 lb (88.451 kg), SpO2 97.00%., Body mass index is 32.45 kg/(m^2). General: Well developed, well nourished, in no acute distress. Head: Normocephalic, atraumatic, eyes without discharge, sclera non-icteric, nares are without discharge. Bilateral auditory canals clear, TM's are without perforation, pearly grey and translucent with reflective cone of light bilaterally. Oral cavity moist, posterior  pharynx without exudate, erythema, peritonsillar abscess, or post nasal drip. Uvula midline.   Neck: Supple. No thyromegaly. Full ROM. No lymphadenopathy. Lungs: Clear bilaterally to auscultation without wheezes, rales, or rhonchi. Breathing is unlabored. Heart: RRR with S1 S2. No murmurs, rubs, or gallops appreciated. Msk:  Strength and tone normal for age. Extremities/Skin: Warm and dry. No clubbing or cyanosis. No edema. Bilateral hands with hyperpigmentation, thickening of the skin, mild peeling and cracking in a couple of places. No signs of secondary infections. FROM of the hands and digits. Hands are consistent with eczema-like rash.   Neuro: Alert and oriented X 3. Moves all extremities spontaneously. Gait is normal. CNII-XII grossly in tact. Psych:  Responds to questions appropriately with a normal  affect.        Assessment & Plan:  63 year old female with eczema here for medication refill. -No signs of secondary infection  -Clobetasol 0.05% Apply bid #60 grams RF 5 -If develops secondary infection RTC -RTC prn    Christell Faith, MHS, PA-C [facility] [address] [place], [place] [address] [place] 01/04/2014 5:29 PM

## 2014-03-02 ENCOUNTER — Telehealth: Payer: Self-pay

## 2014-03-02 ENCOUNTER — Telehealth: Payer: Self-pay | Admitting: Adult Health

## 2014-03-02 NOTE — Telephone Encounter (Signed)
Next available with any provider,  Thanks, [place]

## 2014-03-02 NOTE — Telephone Encounter (Signed)
per note LC req for pt to be seen by any provider w/openings. Cld & talkwd w/pt to adv that appt 5/6 @ 11:45/pt understood/pof marked as URGENT

## 2014-03-02 NOTE — Telephone Encounter (Signed)
Call Documentation     Minette Headland, NP at 03/02/2014 2:04 PM     Status: Signed        Next available with any provider, Thanks, LC        Prentiss Bells, RN at 03/02/2014 10:47 AM     Status: Signed        Returned pt call - new lump in medial left breast midway between top and nipple. Occasional twinges of pain, no discharge. Wants to know if she needs to be seen. Routed to [facility]

## 2014-03-02 NOTE — Telephone Encounter (Signed)
Returned pt call - new lump in medial left breast midway between top and nipple.  Occasional twinges of pain, no discharge.  Wants to know if she needs to be seen.  Routed to [facility]

## 2014-03-04 ENCOUNTER — Telehealth: Payer: Self-pay | Admitting: Oncology

## 2014-03-04 ENCOUNTER — Ambulatory Visit (HOSPITAL_BASED_OUTPATIENT_CLINIC_OR_DEPARTMENT_OTHER): Payer: BC Managed Care – PPO | Admitting: Oncology

## 2014-03-04 ENCOUNTER — Encounter: Payer: Self-pay | Admitting: Oncology

## 2014-03-04 VITALS — BP 142/74 | HR 102 | Temp 98.9°F | Resp 20 | Ht 65.0 in | Wt 191.8 lb

## 2014-03-04 DIAGNOSIS — Z853 Personal history of malignant neoplasm of breast: Secondary | ICD-10-CM

## 2014-03-04 DIAGNOSIS — C50919 Malignant neoplasm of unspecified site of unspecified female breast: Secondary | ICD-10-CM

## 2014-03-04 NOTE — Telephone Encounter (Signed)
gv and printed appt sched for [facility] on 5.11.15 at 2pm

## 2014-03-04 NOTE — Progress Notes (Signed)
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  #2 she then went on to complete radiation therapy 02/03/2008.  #3 this tumor was ER positive positive patient  placed on tamoxifen by Dr. Truddie Coco. She has now completed 5 years of tamoxifen 12/2/ 2014.  CURRENT THERAPY: Observation  INTERVAL HISTORY: Toni Williams 63 y.o. female returns for a work-in visit today. The patient called our office the other day reporting a new lump in her left breast. Today the patient tells me that the lump has been present for approximately 1-2 weeks. This area is not tender. She denies any redness to her skin. No swelling of the breast. Remainder of the 10 point review of systems is negative.  MEDICAL HISTORY: Past Medical History  Diagnosis Date  . Cancer   . Missed abortion     x 1  . Termination of pregnancy     x 1  . Seasonal allergies   . Hypertension   . Blood transfusion 1979    with missed abortion in [facility] -Dr Heber Hatton  . Anxiety     no meds    ALLERGIES:  is allergic to morphine and related and latex.  MEDICATIONS:  Current Outpatient Prescriptions  Medication Sig Dispense Refill  . azelastine (ASTELIN) 137 MCG/SPRAY nasal spray Place 1 spray into the nose 2 (two) times daily. Use in each nostril as directed      . cholecalciferol (VITAMIN D) 1000 UNITS tablet Take 2,000 Units by mouth daily.        . clobetasol ointment (TEMOVATE) 6.01 % Apply 1 application topically 2 (two) times daily.  60 g  5  . fluticasone (FLONASE) 50 MCG/ACT nasal spray Place 2 sprays into the nose daily.      . hydrochlorothiazide (HYDRODIURIL) 25 MG tablet Take 1 tablet (25 mg total) by mouth daily. PATIENT NEEDS OFFICE VISIT/LABS FOR ADDITIONAL REFILLS  90 tablet  3   No current facility-administered  medications for this visit.    SURGICAL HISTORY:  Past Surgical History  Procedure Laterality Date  . Cholecystectomy    . Wisdom tooth extraction    . Cesarean section    . Colonscopy    . Dilation and curettage of uterus    . Breast surgery  10/03/2007, 11/06/2007    Left breast excision, re-excision Left breast  . Breast surgery      No IV sticks/Blood Draws/Blood Pressure Left Arm,  . Hysteroscopy w/d&c  10/27/2011    Procedure: DILATATION AND CURETTAGE /HYSTEROSCOPY;  Surgeon: Delice Lesch, MD;  Location: [facility];  Service: Gynecology;  Laterality: N/A;    REVIEW OF SYSTEMS:  A comprehensive review of systems was negative.   HEALTH MAINTENANCE:  PHYSICAL EXAMINATION: Blood pressure 142/74, pulse 102, temperature 98.9 F (37.2 C), temperature source Oral, resp. rate 20, height 5\' 5"  (1.651 m), weight 191 lb 12.8 oz (87 kg), SpO2 98.00%. Body mass index is 31.92 kg/(m^2). ECOG PERFORMANCE STATUS: 0 - Asymptomatic   General appearance: alert, cooperative and appears stated age Lymph nodes: Cervical, supraclavicular, and axillary nodes normal. Resp: clear to auscultation bilaterally Cardio: regular rate and rhythm GI: soft, non-tender; bowel sounds normal; no masses,  no organomegaly Extremities: extremities normal, atraumatic, no cyanosis or edema Neurologic: Grossly normal BREAST: Left breast with an approximately 0.7 cm  round nodule at the 12:00 position. No redness noted. Area is not tender.   LABORATORY DATA: Lab Results  Component Value Date   WBC 6.8 09/23/2013   HGB 12.3 09/23/2013   HCT 37.4 09/23/2013   MCV 79.5 09/23/2013   PLT 271 09/23/2013      Chemistry      Component Value Date/Time   NA 143 09/23/2013 0909   NA 141 06/21/2013 0923   K 3.7 09/23/2013 0909   K 4.2 06/21/2013 0923   CL 105 06/21/2013 0923   CL 105 09/17/2012 1342   CO2 24 09/23/2013 0909   CO2 28 06/21/2013 0923   BUN 11.3 09/23/2013 0909   BUN 13 06/21/2013 0923   CREATININE 0.7  09/23/2013 0909   CREATININE 0.74 06/21/2013 0923   CREATININE 0.71 10/18/2011 1217      Component Value Date/Time   CALCIUM 9.4 09/23/2013 0909   CALCIUM 9.4 06/21/2013 0923   ALKPHOS 55 09/23/2013 0909   ALKPHOS 53 04/24/2012 1032   AST 35* 09/23/2013 0909   AST 19 04/24/2012 1032   ALT 30 09/23/2013 0909   ALT 15 04/24/2012 1032   BILITOT 0.55 09/23/2013 0909   BILITOT 0.5 04/24/2012 1032       RADIOGRAPHIC STUDIES:  No results found.  ASSESSMENT: 63 year old female with history DCIS of the left breast status post lumpectomy and a 12 radiation therapy. Patient has now completed 5 years of tamoxifen as chemoprevention. The patient has a new nodule in her left breast. Most recent mammogram in November 2014 was normal.   PLAN:   #1 will obtain a diagnostic mammogram of her left breast within the next few days.  #2 patient is to call me with any problems. She will keep her followup as scheduled in December of this year. Certainly we will see her sooner if her mammogram is abnormal.   All questions were answered. The patient knows to call the clinic with any problems, questions or concerns. We can certainly see the patient much sooner if necessary.  I spent 15 minutes counseling the patient face to face. The total time spent in the appointment was 20 minutes.    Mikey Bussing, DNP, AGPCNP-BC  03/04/2014, 12:45 PM

## 2014-03-09 ENCOUNTER — Other Ambulatory Visit: Payer: Self-pay

## 2014-03-09 NOTE — Progress Notes (Signed)
Faxed Order for mamography to [facility] dtd 03/09/14.  Original sent to scan.

## 2014-04-02 ENCOUNTER — Encounter: Payer: Self-pay | Admitting: Oncology

## 2014-07-18 ENCOUNTER — Other Ambulatory Visit: Payer: Self-pay | Admitting: Family Medicine

## 2014-07-25 ENCOUNTER — Telehealth: Payer: Self-pay | Admitting: Hematology and Oncology

## 2014-07-25 NOTE — Telephone Encounter (Signed)
Spk w/pt confirming MD/schedule change, mailing out updated sch.....  KJ °

## 2014-07-31 ENCOUNTER — Other Ambulatory Visit: Payer: Self-pay | Admitting: Family Medicine

## 2014-10-02 ENCOUNTER — Ambulatory Visit: Payer: BC Managed Care – PPO | Admitting: Hematology and Oncology

## 2014-10-02 ENCOUNTER — Other Ambulatory Visit: Payer: BC Managed Care – PPO

## 2014-10-10 ENCOUNTER — Other Ambulatory Visit: Payer: Self-pay | Admitting: Family Medicine

## 2014-10-10 ENCOUNTER — Ambulatory Visit (INDEPENDENT_AMBULATORY_CARE_PROVIDER_SITE_OTHER): Payer: BC Managed Care – PPO | Admitting: Family Medicine

## 2014-10-10 VITALS — BP 110/74 | HR 79 | Temp 97.9°F | Resp 16 | Ht 65.0 in | Wt 196.8 lb

## 2014-10-10 DIAGNOSIS — Z01419 Encounter for gynecological examination (general) (routine) without abnormal findings: Secondary | ICD-10-CM

## 2014-10-10 DIAGNOSIS — I10 Essential (primary) hypertension: Secondary | ICD-10-CM

## 2014-10-10 DIAGNOSIS — N939 Abnormal uterine and vaginal bleeding, unspecified: Secondary | ICD-10-CM

## 2014-10-10 DIAGNOSIS — R945 Abnormal results of liver function studies: Secondary | ICD-10-CM

## 2014-10-10 DIAGNOSIS — L309 Dermatitis, unspecified: Secondary | ICD-10-CM

## 2014-10-10 DIAGNOSIS — R7989 Other specified abnormal findings of blood chemistry: Secondary | ICD-10-CM

## 2014-10-10 DIAGNOSIS — Z0000 Encounter for general adult medical examination without abnormal findings: Secondary | ICD-10-CM

## 2014-10-10 DIAGNOSIS — N842 Polyp of vagina: Secondary | ICD-10-CM

## 2014-10-10 DIAGNOSIS — J309 Allergic rhinitis, unspecified: Secondary | ICD-10-CM

## 2014-10-10 DIAGNOSIS — Z124 Encounter for screening for malignant neoplasm of cervix: Secondary | ICD-10-CM

## 2014-10-10 LAB — POCT URINALYSIS DIPSTICK
BILIRUBIN UA: NEGATIVE
GLUCOSE UA: NEGATIVE
Leukocytes, UA: NEGATIVE
Nitrite, UA: NEGATIVE
Protein, UA: NEGATIVE
Spec Grav, UA: 1.02
Urobilinogen, UA: 0.2
pH, UA: 6

## 2014-10-10 LAB — LIPID PANEL
CHOL/HDL RATIO: 3.5 ratio
CHOLESTEROL: 186 mg/dL (ref 0–200)
HDL: 53 mg/dL (ref >39)
LDL Cholesterol: 117 mg/dL — ABNORMAL HIGH (ref 0–99)
Triglycerides: 81 mg/dL (ref <150)
VLDL: 16 mg/dL (ref 0–40)

## 2014-10-10 LAB — CBC
HCT: 40.6 % (ref 36.0–46.0)
HEMOGLOBIN: 13.6 g/dL (ref 12.0–15.0)
MCH: 25.9 pg — AB (ref 26.0–34.0)
MCHC: 33.5 g/dL (ref 30.0–36.0)
MCV: 77.2 fL — ABNORMAL LOW (ref 78.0–100.0)
MPV: 9.5 fL (ref 9.4–12.4)
Platelets: 331 10*3/uL (ref 150–400)
RBC: 5.26 MIL/uL — ABNORMAL HIGH (ref 3.87–5.11)
RDW: 15.8 % — ABNORMAL HIGH (ref 11.5–15.5)
WBC: 6.8 10*3/uL (ref 4.0–10.5)

## 2014-10-10 LAB — COMPREHENSIVE METABOLIC PANEL
ALT: 19 U/L (ref 0–35)
AST: 20 U/L (ref 0–37)
Albumin: 4.1 g/dL (ref 3.5–5.2)
Alkaline Phosphatase: 92 U/L (ref 39–117)
BUN: 13 mg/dL (ref 6–23)
CALCIUM: 9.9 mg/dL (ref 8.4–10.5)
CO2: 25 meq/L (ref 19–32)
CREATININE: 0.68 mg/dL (ref 0.50–1.10)
Chloride: 104 mEq/L (ref 96–112)
Glucose, Bld: 103 mg/dL — ABNORMAL HIGH (ref 70–99)
Potassium: 3.9 mEq/L (ref 3.5–5.3)
Sodium: 139 mEq/L (ref 135–145)
Total Bilirubin: 0.8 mg/dL (ref 0.2–1.2)
Total Protein: 7.9 g/dL (ref 6.0–8.3)

## 2014-10-10 LAB — TSH: TSH: 1.994 u[IU]/mL (ref 0.350–4.500)

## 2014-10-10 LAB — HEPATITIS C ANTIBODY: HCV Ab: NEGATIVE

## 2014-10-10 MED ORDER — FLUTICASONE PROPIONATE 50 MCG/ACT NA SUSP
2.0000 | Freq: Every day | NASAL | Status: DC
Start: 2014-10-10 — End: 2016-10-04

## 2014-10-10 MED ORDER — PREDNISONE 20 MG PO TABS: ORAL_TABLET | ORAL | Status: DC

## 2014-10-10 MED ORDER — AZELASTINE HCL 0.1 % NA SOLN: 1.0000 | Freq: Two times a day (BID) | NASAL | Status: DC

## 2014-10-10 MED ORDER — HYDROCHLOROTHIAZIDE 25 MG PO TABS: 25.0000 mg | ORAL_TABLET | Freq: Every day | ORAL | Status: DC

## 2014-10-10 MED ORDER — FLUOCINOLONE ACETONIDE SCALP 0.01 % EX OIL: 1.0000 "application " | TOPICAL_OIL | Freq: Every day | CUTANEOUS | Status: DC

## 2014-10-10 MED ORDER — CLOBETASOL PROPIONATE 0.05 % EX OINT: 1.0000 "application " | TOPICAL_OINTMENT | Freq: Two times a day (BID) | CUTANEOUS | Status: DC

## 2014-10-10 MED ORDER — FLUOCINONIDE 0.1 % EX CREA: 1.0000 "application " | TOPICAL_CREAM | Freq: Every day | CUTANEOUS | Status: AC

## 2014-10-10 MED ORDER — ZOSTER VACCINE LIVE 19400 UNT/0.65ML ~~LOC~~ SOLR: 0.6500 mL | Freq: Once | SUBCUTANEOUS | Status: DC

## 2014-10-10 NOTE — Patient Instructions (Signed)
Make sure you get your tetanus shot before you turn 63 yo and before you go on Medicare.   Make sure you get your shingles vaccine as well - wait to get any vaccines until at least 3 weeks after the prednisone course for your eczema. Start taking a calcium supplement daily along with your vitamin D supplement. I would recommend following up with gynecology due to your vaginal polyp which is likely causing bleeding.  It would be great to get this and any fibroids treated so that we do not keep on being concerned about possible cancer whenever you have vaginal bleeding.  Health Maintenance Adopting a healthy lifestyle and getting preventive care can go a long way to promote health and wellness. Talk with your health care provider about what schedule of regular examinations is right for you. This is a good chance for you to check in with your provider about disease prevention and staying healthy. In between checkups, there are plenty of things you can do on your own. Experts have done a lot of research about which lifestyle changes and preventive measures are most likely to keep you healthy. Ask your health care provider for more information. WEIGHT AND DIET  Eat a healthy diet  Be sure to include plenty of vegetables, fruits, low-fat dairy products, and lean protein.  Do not eat a lot of foods high in solid fats, added sugars, or salt.  Get regular exercise. This is one of the most important things you can do for your health.  Most adults should exercise for at least 150 minutes each week. The exercise should increase your heart rate and make you sweat (moderate-intensity exercise).  Most adults should also do strengthening exercises at least twice a week. This is in addition to the moderate-intensity exercise.  Maintain a healthy weight  Body mass index (BMI) is a measurement that can be used to identify possible weight problems. It estimates body fat based on height and weight. Your health  care provider can help determine your BMI and help you achieve or maintain a healthy weight.  For females 71 years of age and older:   A BMI below 18.5 is considered underweight.  A BMI of 18.5 to 24.9 is normal.  A BMI of 25 to 29.9 is considered overweight.  A BMI of 30 and above is considered obese.  Watch levels of cholesterol and blood lipids  You should start having your blood tested for lipids and cholesterol at 64 years of age, then have this test every 5 years.  You may need to have your cholesterol levels checked more often if:  Your lipid or cholesterol levels are high.  You are older than 64 years of age.  You are at high risk for heart disease.  CANCER SCREENING   Lung Cancer  Lung cancer screening is recommended for adults 36-43 years old who are at high risk for lung cancer because of a history of smoking.  A yearly low-dose CT scan of the lungs is recommended for people who:  Currently smoke.  Have quit within the past 15 years.  Have at least a 30-pack-year history of smoking. A pack year is smoking an average of one pack of cigarettes a day for 1 year.  Yearly screening should continue until it has been 15 years since you quit.  Yearly screening should stop if you develop a health problem that would prevent you from having lung cancer treatment.  Breast Cancer  Practice breast self-awareness. This  means understanding how your breasts normally appear and feel.  It also means doing regular breast self-exams. Let your health care provider know about any changes, no matter how small.  If you are in your 20s or 30s, you should have a clinical breast exam (CBE) by a health care provider every 1-3 years as part of a regular health exam.  If you are 37 or older, have a CBE every year. Also consider having a breast X-ray (mammogram) every year.  If you have a family history of breast cancer, talk to your health care provider about genetic  screening.  If you are at high risk for breast cancer, talk to your health care provider about having an MRI and a mammogram every year.  Breast cancer gene (BRCA) assessment is recommended for women who have family members with BRCA-related cancers. BRCA-related cancers include:  Breast.  Ovarian.  Tubal.  Peritoneal cancers.  Results of the assessment will determine the need for genetic counseling and BRCA1 and BRCA2 testing. Cervical Cancer Routine pelvic examinations to screen for cervical cancer are no longer recommended for nonpregnant women who are considered low risk for cancer of the pelvic organs (ovaries, uterus, and vagina) and who do not have symptoms. A pelvic examination may be necessary if you have symptoms including those associated with pelvic infections. Ask your health care provider if a screening pelvic exam is right for you.   The Pap test is the screening test for cervical cancer for women who are considered at risk.  If you had a hysterectomy for a problem that was not cancer or a condition that could lead to cancer, then you no longer need Pap tests.  If you are older than 65 years, and you have had normal Pap tests for the past 10 years, you no longer need to have Pap tests.  If you have had past treatment for cervical cancer or a condition that could lead to cancer, you need Pap tests and screening for cancer for at least 20 years after your treatment.  If you no longer get a Pap test, assess your risk factors if they change (such as having a new sexual partner). This can affect whether you should start being screened again.  Some women have medical problems that increase their chance of getting cervical cancer. If this is the case for you, your health care provider may recommend more frequent screening and Pap tests.  The human papillomavirus (HPV) test is another test that may be used for cervical cancer screening. The HPV test looks for the virus that can  cause cell changes in the cervix. The cells collected during the Pap test can be tested for HPV.  The HPV test can be used to screen women 8 years of age and older. Getting tested for HPV can extend the interval between normal Pap tests from three to five years.  An HPV test also should be used to screen women of any age who have unclear Pap test results.  After 64 years of age, women should have HPV testing as often as Pap tests.  Colorectal Cancer  This type of cancer can be detected and often prevented.  Routine colorectal cancer screening usually begins at 64 years of age and continues through 64 years of age.  Your health care provider may recommend screening at an earlier age if you have risk factors for colon cancer.  Your health care provider may also recommend using home test kits to check for hidden  blood in the stool.  A small camera at the end of a tube can be used to examine your colon directly (sigmoidoscopy or colonoscopy). This is done to check for the earliest forms of colorectal cancer.  Routine screening usually begins at age 3.  Direct examination of the colon should be repeated every 5-10 years through 64 years of age. However, you may need to be screened more often if early forms of precancerous polyps or small growths are found. Skin Cancer  Check your skin from head to toe regularly.  Tell your health care provider about any new moles or changes in moles, especially if there is a change in a mole's shape or color.  Also tell your health care provider if you have a mole that is larger than the size of a pencil eraser.  Always use sunscreen. Apply sunscreen liberally and repeatedly throughout the day.  Protect yourself by wearing long sleeves, pants, a wide-brimmed hat, and sunglasses whenever you are outside. HEART DISEASE, DIABETES, AND HIGH BLOOD PRESSURE   Have your blood pressure checked at least every 1-2 years. High blood pressure causes heart  disease and increases the risk of stroke.  If you are between 82 years and 34 years old, ask your health care provider if you should take aspirin to prevent strokes.  Have regular diabetes screenings. This involves taking a blood sample to check your fasting blood sugar level.  If you are at a normal weight and have a low risk for diabetes, have this test once every three years after 64 years of age.  If you are overweight and have a high risk for diabetes, consider being tested at a younger age or more often. PREVENTING INFECTION  Hepatitis B  If you have a higher risk for hepatitis B, you should be screened for this virus. You are considered at high risk for hepatitis B if:  You were born in a country where hepatitis B is common. Ask your health care provider which countries are considered high risk.  Your parents were born in a high-risk country, and you have not been immunized against hepatitis B (hepatitis B vaccine).  You have HIV or AIDS.  You use needles to inject street drugs.  You live with someone who has hepatitis B.  You have had sex with someone who has hepatitis B.  You get hemodialysis treatment.  You take certain medicines for conditions, including cancer, organ transplantation, and autoimmune conditions. Hepatitis C  Blood testing is recommended for:  Everyone born from [birth year] through 1966.  Anyone with known risk factors for hepatitis C. Sexually transmitted infections (STIs)  You should be screened for sexually transmitted infections (STIs) including gonorrhea and chlamydia if:  You are sexually active and are younger than 64 years of age.  You are older than 64 years of age and your health care provider tells you that you are at risk for this type of infection.  Your sexual activity has changed since you were last screened and you are at an increased risk for chlamydia or gonorrhea. Ask your health care provider if you are at risk.  If you do not have  HIV, but are at risk, it may be recommended that you take a prescription medicine daily to prevent HIV infection. This is called pre-exposure prophylaxis (PrEP). You are considered at risk if:  You are sexually active and do not regularly use condoms or know the HIV status of your partner(s).  You take drugs by injection.  You are sexually active with a partner who has HIV. Talk with your health care provider about whether you are at high risk of being infected with HIV. If you choose to begin PrEP, you should first be tested for HIV. You should then be tested every 3 months for as long as you are taking PrEP.  PREGNANCY   If you are premenopausal and you may become pregnant, ask your health care provider about preconception counseling.  If you may become pregnant, take 400 to 800 micrograms (mcg) of folic acid every day.  If you want to prevent pregnancy, talk to your health care provider about birth control (contraception). OSTEOPOROSIS AND MENOPAUSE   Osteoporosis is a disease in which the bones lose minerals and strength with aging. This can result in serious bone fractures. Your risk for osteoporosis can be identified using a bone density scan.  If you are 97 years of age or older, or if you are at risk for osteoporosis and fractures, ask your health care provider if you should be screened.  Ask your health care provider whether you should take a calcium or vitamin D supplement to lower your risk for osteoporosis.  Menopause may have certain physical symptoms and risks.  Hormone replacement therapy may reduce some of these symptoms and risks. Talk to your health care provider about whether hormone replacement therapy is right for you.  HOME CARE INSTRUCTIONS   Schedule regular health, dental, and eye exams.  Stay current with your immunizations.   Do not use any tobacco products including cigarettes, chewing tobacco, or electronic cigarettes.  If you are pregnant, do not  drink alcohol.  If you are breastfeeding, limit how much and how often you drink alcohol.  Limit alcohol intake to no more than 1 drink per day for nonpregnant women. One drink equals 12 ounces of beer, 5 ounces of wine, or 1 ounces of hard liquor.  Do not use street drugs.  Do not share needles.  Ask your health care provider for help if you need support or information about quitting drugs.  Tell your health care provider if you often feel depressed.  Tell your health care provider if you have ever been abused or do not feel safe at home. Document Released: 05/01/2011 Document Revised: 03/02/2014 Document Reviewed: 09/17/2013 [facility] Patient Information 2015 [place], [place]. This information is not intended to replace advice given to you by your health care provider. Make sure you discuss any questions you have with your health care provider. Eczema Eczema, also called atopic dermatitis, is a skin disorder that causes inflammation of the skin. It causes a red rash and dry, scaly skin. The skin becomes very itchy. Eczema is generally worse during the cooler winter months and often improves with the warmth of summer. Eczema usually starts showing signs in infancy. Some children outgrow eczema, but it may last through adulthood.  CAUSES  The exact cause of eczema is not known, but it appears to run in families. People with eczema often have a family history of eczema, allergies, asthma, or hay fever. Eczema is not contagious. Flare-ups of the condition may be caused by:   Contact with something you are sensitive or allergic to.   Stress. SIGNS AND SYMPTOMS  Dry, scaly skin.   Red, itchy rash.   Itchiness. This may occur before the skin rash and may be very intense.  DIAGNOSIS  The diagnosis of eczema is usually made based on symptoms and medical history. TREATMENT  Eczema cannot  be cured, but symptoms usually can be controlled with treatment and other strategies. A treatment  plan might include:  Controlling the itching and scratching.   Use over-the-counter antihistamines as directed for itching. This is especially useful at night when the itching tends to be worse.   Use over-the-counter steroid creams as directed for itching.   Avoid scratching. Scratching makes the rash and itching worse. It may also result in a skin infection (impetigo) due to a break in the skin caused by scratching.   Keeping the skin well moisturized with creams every day. This will seal in moisture and help prevent dryness. Lotions that contain alcohol and water should be avoided because they can dry the skin.   Limiting exposure to things that you are sensitive or allergic to (allergens).   Recognizing situations that cause stress.   Developing a plan to manage stress.  HOME CARE INSTRUCTIONS   Only take over-the-counter or prescription medicines as directed by your health care provider.   Do not use anything on the skin without checking with your health care provider.   Keep baths or showers short (5 minutes) in warm (not hot) water. Use mild cleansers for bathing. These should be unscented. You may add nonperfumed bath oil to the bath water. It is best to avoid soap and bubble bath.   Immediately after a bath or shower, when the skin is still damp, apply a moisturizing ointment to the entire body. This ointment should be a petroleum ointment. This will seal in moisture and help prevent dryness. The thicker the ointment, the better. These should be unscented.   Keep fingernails cut short. Children with eczema may need to wear soft gloves or mittens at night after applying an ointment.   Dress in clothes made of cotton or cotton blends. Dress lightly, because heat increases itching.   A child with eczema should stay away from anyone with fever blisters or cold sores. The virus that causes fever blisters (herpes simplex) can cause a serious skin infection in children  with eczema. SEEK MEDICAL CARE IF:   Your itching interferes with sleep.   Your rash gets worse or is not better within 1 week after starting treatment.   You see pus or soft yellow scabs in the rash area.   You have a fever.   You have a rash flare-up after contact with someone who has fever blisters.  Document Released: 10/13/2000 Document Revised: 08/06/2013 Document Reviewed: 05/19/2013 [facility] Patient Information 2015 [place], [place]. This information is not intended to replace advice given to you by your health care provider. Make sure you discuss any questions you have with your health care provider.

## 2014-10-10 NOTE — Progress Notes (Addendum)
Subjective:    Patient ID: Toni Williams, female    DOB: 07/05/1951, 63 y.o.   MRN: [id number] This chart was scribed for Delman Cheadle, MD by Zola Button, Medical Scribe. This patient was seen in Room 9 and the patient's care was started at 10:37 AM.   Chief Complaint  Patient presents with  . Annual Exam    with pap  . Rash    left breast and back of her head    HPI HPI Comments: Toni Williams is a 63 y.o. female with a hx of eczema who presents to the [facility] for a complete physical exam.  She was last seen for a full physical 2.5 years ago. She has status post treatment for high grade DCIS breast CA, status post radiation therapy. Her last pap smear was in April 2013 which was normal. She had negative pap smear 02/27/12. No HPV testing done. She has her mammograms followed at [facility] regularly, last done in May of this year. Patient was recommended to have repeat mammogram in November of this year. Her last tetanus injection was in 2006. She had a colonoscopy in 2010 that was normal. Patient had a bone density scan done at [facility] in November 2013 which was normal. T-scores were actually all positive. She had last had her lab work done over a year ago with no significant abnormalities. Vitamin D has been normal. Last lipid panel 2.5 years ago was normal. No prior hepatitis C screen seen on chart. Patient did have a cardiology evaluation 2.5 years ago due to LBBB seen on EKG. She had a lexiscan myoview study 04/2012 that showed no evidence of ischemia or infarction.  Rash: Patient complains of an itching rash on the back of her head that started about a month ago. She also has an itching rash on her left breast that started a few months ago. She has an upcoming appointment with dermatologist Dr. Delman Cheadle in January 26th. She has stopped hair dye treatments; she uses Shea moisture shampoo currently, which she has used before without problems. She uses Aveeno oatmeal  lotion and coconut oil. She was last on an oral course of steroids about a year ago. Patient is not on any medications for allergies currently.  Health Maintenance: Patient has not eaten yet today and agrees to have labs done today. She is still taking her vitamin D supplement. She is not taking any calcium supplements; she does not drink much milk but eats cereal with milk about every other day. Her last colonoscopy was done by Dr. Benson Norway in 2010 and was normal. She denies any changes in her bowels or FMHx of colon CA. Patient does not do flu shots normally and does not like vaccines. She does not remember if she ever had chicken pox as a child. She denies hx of abnormal pap smears.  Past Medical History  Diagnosis Date  . Cancer   . Missed abortion     x 1  . Termination of pregnancy     x 1  . Seasonal allergies   . Hypertension   . Blood transfusion 1979    with missed abortion in [facility] -Dr He[facility] [place]  . Anxiety     no meds   Current Outpatient Prescriptions on File Prior to Visit  Medication Sig Dispense Refill  . cholecalciferol (VITAMIN D) 1000 UNITS tablet Take 2,000 Units by mouth daily.       No current facility-administered medications on file  prior to visit.   Allergies  Allergen Reactions  . Morphine And Related Other (See Comments)    "psychotic" reaction per pt report  . Latex Rash    itching   Past Surgical History  Procedure Laterality Date  . Cholecystectomy    . Wisdom tooth extraction    . Cesarean section    . Colonscopy    . Dilation and curettage of uterus    . Breast surgery  10/03/2007, 11/06/2007    Left breast excision, re-excision Left breast  . Breast surgery      No IV sticks/Blood Draws/Blood Pressure Left Arm,  . Hysteroscopy w/d&c  10/27/2011    Procedure: DILATATION AND CURETTAGE /HYSTEROSCOPY;  Surgeon: Delice Lesch, MD;  Location: [facility];  Service: Gynecology;  Laterality: N/A;   Family History  Problem Relation Age of Onset  .  Cancer Mother 21    breast (R),stomach, and uterine  . Cancer Father 50    lung from smoking  3 pks/day  . Diabetes Maternal Grandmother     amputation of (L) leg  . Stroke Maternal Grandfather 60  . Diabetes Brother    History   Social History  . Marital Status: Married    Spouse Name: N/A    Number of Children: N/A  . Years of Education: N/A   Social History Main Topics  . Smoking status: Former Smoker -- 0.25 packs/day for 2 years    Types: Cigarettes    Quit date: 10/30/1976  . Smokeless tobacco: Never Used  . Alcohol Use: No  . Drug Use: No  . Sexual Activity: Not Currently    Birth Control/ Protection: None   Other Topics Concern  . None   Social History Narrative      Review of Systems  Gastrointestinal: Negative.   Skin: Positive for rash.       Objective:  BP 110/74 mmHg  Pulse 79  Temp(Src) 97.9 F (36.6 C) (Oral)  Resp 16  Ht 5\' 5"  (1.651 m)  Wt 196 lb 12.8 oz (89.268 kg)  BMI 32.75 kg/m2  SpO2 98%  Physical Exam  Constitutional: She is oriented to person, place, and time. She appears well-developed and well-nourished. No distress.  HENT:  Head: Normocephalic and atraumatic.  Right Ear: Tympanic membrane and external ear normal.  Left Ear: Tympanic membrane and external ear normal.  Nose: Nose normal.  Mouth/Throat: Oropharynx is clear and moist. No oropharyngeal exudate.  Eyes: Pupils are equal, round, and reactive to light.  Neck: Neck supple. No thyromegaly present.  Thyroid normal.  Cardiovascular: Normal rate, regular rhythm and normal heart sounds.   No murmur heard. Pulmonary/Chest: Effort normal and breath sounds normal. No respiratory distress. She has no wheezes. She has no rales.  Right breast has some dense tissue but no concerning palpable abnormality. Left breast has thickened hyperpigmented skin almost over entire breast, including nipple. No soft tissue abnormality palpable.  Abdominal: Soft. Bowel sounds are normal. She  exhibits no distension. There is no hepatosplenomegaly. There is no tenderness.  Genitourinary: Vaginal discharge found.  She has hyperpigmented with raw areas of excoriation and inguinal creases along labium majora. Labia minora normal. Vagina with small amount of yellow mucosal discharge and friable polyp approximately 3-4 mm pedunculated and covered with pink mucosa along left upper vaginal wall seeming to extend from around 3 o'clock area near cervix.  Musculoskeletal: She exhibits no edema.  Lymphadenopathy:    She has no cervical adenopathy.  Neurological: She is  alert and oriented to person, place, and time. No cranial nerve deficit.  Skin: Skin is warm and dry. Rash noted.  Thickened lichenified plaque extending from occiput of neck to scalp with several excoriated areas. Thickened hyperpigmented macules of skin left breast.  Psychiatric: She has a normal mood and affect. Her behavior is normal.  Nursing note and vitals reviewed.         Assessment & Plan:   Health maintenance examination  Eczema - Plan: clobetasol ointment (TEMOVATE) 0.05 % - pt requests parenteral steroids - have worked very well for her prev. Pt has had severe eczema her whole life so very well aware of treatment guidelines and medication effects.  Essential hypertension - ASCVD risk 5.6%  Allergic rhinitis, unspecified allergic rhinitis type  Vaginal bleeding, abnormal - Plan: POCT urinalysis dipstick, CBC, TSH, Pap IG and HPV (high risk) DNA detection - Pt needs pelvic US to assess post-menopausal bleeding. Pt prefers to follow up with her gynecologist, Dr. Mancel Bale at [facility] for further eval of this per my recommendation and will have pelvic US ordered by them.  Vaginal polyp - appears grossly benign but is on vaginal wall immediately outside of cervix. Rec gyn eval to ensure no further treatment is needed.  Screening for cervical cancer - Plan: Pap IG and HPV (high risk) DNA detection - negative  so no further paps needed unless pt becomes symptomatic with pain and/or bleeding  Elevated liver function tests - Plan: Comprehensive metabolic panel, Lipid panel, Hepatitis C antibody - RESOLVED!  Microcytosis - chronic and stable for years w/ normal hemoglobin so likely a thalassemia trait but check ferritin to r/o iron deficiency  Meds ordered this encounter  Medications  . clobetasol ointment (TEMOVATE) 0.05 %    Sig: Apply 1 application topically 2 (two) times daily.    Dispense:  60 g    Refill:  5  . hydrochlorothiazide (HYDRODIURIL) 25 MG tablet    Sig: Take 1 tablet (25 mg total) by mouth daily.    Dispense:  90 tablet    Refill:  3  . fluticasone (FLONASE) 50 MCG/ACT nasal spray    Sig: Place 2 sprays into both nostrils daily.    Dispense:  16 g    Refill:  11  . azelastine (ASTELIN) 0.1 % nasal spray    Sig: Place 1 spray into both nostrils 2 (two) times daily. Use in each nostril as directed    Dispense:  30 mL    Refill:  11  . predniSONE (DELTASONE) 20 MG tablet    Sig: 3 tabs po qd x 3d, 2 tabs po qd x 3d, 1 tab po qd x 3d, 1/2 tab po qd x 4d    Dispense:  20 tablet    Refill:  0  . Fluocinonide 0.1 % CREA    Sig: Apply 1 application topically daily. This tube should last 2 wks    Dispense:  120 g    Refill:  0  . FLUOCINOLONE ACETONIDE SCALP 0.01 % OIL    Sig: Apply 1 application topically daily. To scalp. Do not use on face, armpits, groin    Dispense:  118 mL    Refill:  0  . zoster vaccine live, PF, (ZOSTAVAX) 33825 UNT/0.65ML injection    Sig: Inject 19,400 Units into the skin once.    Dispense:  1 each    Refill:  0    I personally performed the services described in this documentation, which was  scribed in my presence. The recorded information has been reviewed and considered, and addended by me as needed.  Delman Cheadle, MD MPH  Results for orders placed or performed in visit on 10/10/14  CBC  Result Value Ref Range   WBC 6.8 4.0 - 10.5 K/uL   RBC  5.26 (H) 3.87 - 5.11 MIL/uL   Hemoglobin 13.6 12.0 - 15.0 g/dL   HCT 40.6 36.0 - 46.0 %   MCV 77.2 (L) 78.0 - 100.0 fL   MCH 25.9 (L) 26.0 - 34.0 pg   MCHC 33.5 30.0 - 36.0 g/dL   RDW 15.8 (H) 11.5 - 15.5 %   Platelets 331 150 - 400 K/uL   MPV 9.5 9.4 - 12.4 fL  Comprehensive metabolic panel  Result Value Ref Range   Sodium 139 135 - 145 mEq/L   Potassium 3.9 3.5 - 5.3 mEq/L   Chloride 104 96 - 112 mEq/L   CO2 25 19 - 32 mEq/L   Glucose, Bld 103 (H) 70 - 99 mg/dL   BUN 13 6 - 23 mg/dL   Creat 0.68 0.50 - 1.10 mg/dL   Total Bilirubin 0.8 0.2 - 1.2 mg/dL   Alkaline Phosphatase 92 39 - 117 U/L   AST 20 0 - 37 U/L   ALT 19 0 - 35 U/L   Total Protein 7.9 6.0 - 8.3 g/dL   Albumin 4.1 3.5 - 5.2 g/dL   Calcium 9.9 8.4 - 10.5 mg/dL  TSH  Result Value Ref Range   TSH 1.994 0.350 - 4.500 uIU/mL  Lipid panel  Result Value Ref Range   Cholesterol 186 0 - 200 mg/dL   Triglycerides 81 <150 mg/dL   HDL 53 >39 mg/dL   Total CHOL/HDL Ratio 3.5 Ratio   VLDL 16 0 - 40 mg/dL   LDL Cholesterol 117 (H) 0 - 99 mg/dL  Hepatitis C antibody  Result Value Ref Range   HCV Ab NEGATIVE NEGATIVE  POCT urinalysis dipstick  Result Value Ref Range   Color, UA yellow    Clarity, UA clear    Glucose, UA neg    Bilirubin, UA neg    Ketones, UA nrg    Spec Grav, UA 1.020    Blood, UA moderate    pH, UA 6.0    Protein, UA neg    Urobilinogen, UA 0.2    Nitrite, UA neg    Leukocytes, UA Negative   Pap IG and HPV (high risk) DNA detection  Result Value Ref Range   HPV DNA High Risk Not Detected    Specimen adequacy: SEE NOTE    FINAL DIAGNOSIS: SEE NOTE    Cytotechnologist: SEE NOTE

## 2014-10-12 LAB — PAP IG AND HPV HIGH-RISK: HPV DNA High Risk: NOT DETECTED

## 2014-10-14 ENCOUNTER — Telehealth: Payer: Self-pay

## 2014-10-14 NOTE — Telephone Encounter (Signed)
Mammogram results received from [facility] dtd 09/22/14 Dr Marcelo Baldy.  Copy to Dr Lindi Adie.  Sent to scan.

## 2014-10-15 ENCOUNTER — Encounter: Payer: Self-pay | Admitting: Family Medicine

## 2014-10-15 LAB — FERRITIN: Ferritin: 98 ng/mL (ref 10–291)

## 2014-11-24 ENCOUNTER — Other Ambulatory Visit: Payer: Self-pay | Admitting: Dermatology

## 2015-01-03 ENCOUNTER — Ambulatory Visit (INDEPENDENT_AMBULATORY_CARE_PROVIDER_SITE_OTHER): Payer: BLUE CROSS/BLUE SHIELD | Admitting: Family Medicine

## 2015-01-03 VITALS — BP 118/74 | HR 73 | Temp 97.9°F | Resp 16 | Ht 64.0 in | Wt 199.6 lb

## 2015-01-03 DIAGNOSIS — T7840XA Allergy, unspecified, initial encounter: Secondary | ICD-10-CM

## 2015-01-03 DIAGNOSIS — L309 Dermatitis, unspecified: Secondary | ICD-10-CM

## 2015-01-03 MED ORDER — PREDNISONE 20 MG PO TABS: 40.0000 mg | ORAL_TABLET | Freq: Every day | ORAL | Status: DC

## 2015-01-03 NOTE — Progress Notes (Signed)
Subjective:    Patient ID: Toni Williams, female    DOB: 1951-08-14, 64 y.o.   MRN: [id number] This chart was scribed for Wendie Agreste, MD by Martinique Peace, ED Scribe. The patient was seen in RM08. The patient's care was started at 11:37 AM.  HPI HPI Comments: On review of prior notes, she was seen previously by Dr. Brigitte Pulse. Rash was present on the back of head at that time as well as on her breasts. Was treated with Clobetasol ointment for eczema. History of allergic rhinitis. Last prednisone taper in December 2015.   Toni Williams is a 64 y.o. female who presents to the [facility] complaining of recurrent rash and left sided facial swelling past few days. She denies any use of new lotions, creams, ointments, or perfumes. Pt states she is currently on a fast from sugars, breads, and tea. She also states she has been drinking a new detox beverage since Thursday as a part of her fast. She notes redness and pruritis area on scalp. Allergist is Dr. Ishmael Holter, no food allergies to her knowledge (last visit was 48 or 6 years ago).   Dr. Delman Cheadle is a dermatologist that has been following pt regarding rash on top of her head. She was started on Fluocinolone that she reports she has been using intermittently because it "drys out her hair". Next appt is in about 6 weeks. No complaints of SOB. History of hypertension and breast cancer.   Patient Active Problem List   Diagnosis Date Noted  . Chest pain 05/17/2012  . Abnormal resting ECG findings 04/24/2012  . Vitamin d deficiency 04/24/2012  . Hypertension 02/11/2012  . Breast cancer 02/11/2012  . Eczema 02/11/2012  . Allergic rhinitis due to allergen 02/11/2012   Past Medical History  Diagnosis Date  . Cancer   . Missed abortion     x 1  . Termination of pregnancy     x 1  . Seasonal allergies   . Hypertension   . Blood transfusion 1979    with missed abortion in [facility]Dr [facility] [place]  . Anxiety     no meds   Past Surgical History    Procedure Laterality Date  . Cholecystectomy    . Wisdom tooth extraction    . Cesarean section    . Colonscopy    . Dilation and curettage of uterus    . Breast surgery  10/03/2007, 11/06/2007    Left breast excision, re-excision Left breast  . Breast surgery      No IV sticks/Blood Draws/Blood Pressure Left Arm,  . Hysteroscopy w/d&c  10/27/2011    Procedure: DILATATION AND CURETTAGE /HYSTEROSCOPY;  Surgeon: Delice Lesch, MD;  Location: [facility];  Service: Gynecology;  Laterality: N/A;   Allergies  Allergen Reactions  . Morphine And Related Other (See Comments)    "psychotic" reaction per pt report  . Latex Rash    itching   Prior to Admission medications   Medication Sig Start Date End Date Taking? Authorizing Provider  azelastine (ASTELIN) 0.1 % nasal spray Place 1 spray into both nostrils 2 (two) times daily. Use in each nostril as directed 10/10/14  Yes Shawnee Knapp, MD  cholecalciferol (VITAMIN D) 1000 UNITS tablet Take 2,000 Units by mouth daily.     Yes Historical Provider, MD  clobetasol ointment (TEMOVATE) 6.43 % Apply 1 application topically 2 (two) times daily. 10/10/14  Yes Shawnee Knapp, MD  FLUOCINOLONE ACETONIDE SCALP 0.01 % OIL Apply  1 application topically daily. To scalp. Do not use on face, armpits, groin 10/10/14  Yes Shawnee Knapp, MD  fluticasone [facility]) 50 MCG/ACT nasal spray Place 2 sprays into both nostrils daily. 10/10/14  Yes Shawnee Knapp, MD  hydrochlorothiazide (HYDRODIURIL) 25 MG tablet Take 1 tablet (25 mg total) by mouth daily. 10/10/14  Yes Shawnee Knapp, MD  zoster vaccine live, PF, (ZOSTAVAX) 40981 UNT/0.65ML injection Inject 19,400 Units into the skin once. Patient not taking: Reported on 01/03/2015 10/10/14   Shawnee Knapp, MD   History   Social History  . Marital Status: Married    Spouse Name: N/A  . Number of Children: N/A  . Years of Education: N/A   Occupational History  . Not on file.   Social History Main Topics  . Smoking status: Former  Smoker -- 0.25 packs/day for 2 years    Types: Cigarettes    Quit date: 10/30/1976  . Smokeless tobacco: Never Used  . Alcohol Use: No  . Drug Use: No  . Sexual Activity: Not Currently    Birth Control/ Protection: None   Other Topics Concern  . Not on file   Social History Narrative       Review of Systems  HENT: Positive for facial swelling. Negative for sore throat and trouble swallowing.   Respiratory: Negative for shortness of breath.   Skin: Positive for color change and rash.       Objective:   Physical Exam  Constitutional: She is oriented to person, place, and time. She appears well-developed and well-nourished. No distress.  HENT:  Head: Normocephalic and atraumatic.  Face- erythema, slight soft tissue swelling inferior to left eye as well as left cheek to jaw line. Minimal edema below left eyelid.   Eyes: Conjunctivae and EOM are normal.  Neck: Neck supple. No tracheal deviation present.  Cardiovascular: Normal rate, regular rhythm and normal heart sounds.  Exam reveals no gallop and no friction rub.   No murmur heard. Pulmonary/Chest: Effort normal and breath sounds normal. No stridor. No respiratory distress. She has no wheezes. She has no rales.  Musculoskeletal: Normal range of motion. She exhibits edema.  Neurological: She is alert and oriented to person, place, and time.  Skin: Skin is warm and dry. Rash noted. There is erythema.  Hyperpyremia thickened skin across neck (posterior and anterior), upper face, and along scalp. No hives.   Psychiatric: She has a normal mood and affect. Her behavior is normal.  Nursing note and vitals reviewed.    Filed Vitals:   01/03/15 1124  BP: 118/74  Pulse: 73  Temp: 97.9 F (36.6 C)  TempSrc: Oral  Resp: 16  Height: 5\' 4"  (1.626 m)  Weight: 199 lb 9.6 oz (90.538 kg)  SpO2: 98%        Assessment & Plan:   Toni Williams is a 64 y.o. female Eczema, Allergic reaction, initial encounter - Plan:  predniSONE (DELTASONE) 20 MG tablet, Ambulatory referral to Allergy  - possible reaction to new drink or secondary worsening of underlying eczema.   -prednisone 40mg  qd for 5 days, allegra or zyrtec qd, and use of eucerin or lubriderm to eczematous areas in conjunction with topical steroid.   -follow up with dermatologist, but also referred to allergy.   -RTC/ER precautions.   Meds ordered this encounter  Medications  . desonide (DESOWEN) 0.05 % ointment    Sig:     Refill:  0  . predniSONE (DELTASONE) 20 MG tablet  Sig: Take 2 tablets (40 mg total) by mouth daily with breakfast.    Dispense:  10 tablet    Refill:  0   Patient Instructions  Allegra or zyrtec once per day, start prednisone. Avoid benadryl spray or gold bond. eucerin or lubriderm and your prescribed steroid cream to eczema areas. I referred you to allergist, but keep follow up with dermatologist. Return to the clinic or go to the nearest emergency room if any of your symptoms worsen or new symptoms occur.   I personally performed the services described in this documentation, which was scribed in my presence. The recorded information has been reviewed and considered, and addended by me as needed.

## 2015-01-03 NOTE — Patient Instructions (Signed)
Allegra or zyrtec once per day, start prednisone. Avoid benadryl spray or gold bond. eucerin or lubriderm and your prescribed steroid cream to eczema areas. I referred you to allergist, but keep follow up with dermatologist. Return to the clinic or go to the nearest emergency room if any of your symptoms worsen or new symptoms occur.

## 2015-02-12 ENCOUNTER — Ambulatory Visit (INDEPENDENT_AMBULATORY_CARE_PROVIDER_SITE_OTHER): Payer: BLUE CROSS/BLUE SHIELD | Admitting: Physician Assistant

## 2015-02-12 VITALS — BP 130/78 | HR 121 | Temp 99.9°F | Resp 18 | Ht 63.5 in | Wt 191.6 lb

## 2015-02-12 DIAGNOSIS — J209 Acute bronchitis, unspecified: Secondary | ICD-10-CM

## 2015-02-12 DIAGNOSIS — R05 Cough: Secondary | ICD-10-CM | POA: Diagnosis not present

## 2015-02-12 DIAGNOSIS — M545 Low back pain, unspecified: Secondary | ICD-10-CM

## 2015-02-12 DIAGNOSIS — M62830 Muscle spasm of back: Secondary | ICD-10-CM

## 2015-02-12 DIAGNOSIS — J9801 Acute bronchospasm: Secondary | ICD-10-CM | POA: Diagnosis not present

## 2015-02-12 DIAGNOSIS — R059 Cough, unspecified: Secondary | ICD-10-CM

## 2015-02-12 MED ORDER — HYDROCOD POLST-CPM POLST ER 10-8 MG/5ML PO SUER: 5.0000 mL | Freq: Two times a day (BID) | ORAL | Status: AC | PRN

## 2015-02-12 MED ORDER — MUCINEX DM MAXIMUM STRENGTH 60-1200 MG PO TB12: 1.0000 | ORAL_TABLET | Freq: Two times a day (BID) | ORAL | Status: DC

## 2015-02-12 MED ORDER — ALBUTEROL SULFATE HFA 108 (90 BASE) MCG/ACT IN AERS: 2.0000 | INHALATION_SPRAY | Freq: Four times a day (QID) | RESPIRATORY_TRACT | Status: DC | PRN

## 2015-02-12 MED ORDER — AZITHROMYCIN 250 MG PO TABS: ORAL_TABLET | ORAL | Status: AC

## 2015-02-12 MED ORDER — ALBUTEROL SULFATE (2.5 MG/3ML) 0.083% IN NEBU
2.5000 mg | INHALATION_SOLUTION | Freq: Once | RESPIRATORY_TRACT | Status: AC
  Administered 2015-02-12: 2.5 mg via RESPIRATORY_TRACT

## 2015-02-12 MED ORDER — MELOXICAM 7.5 MG PO TABS: 7.5000 mg | ORAL_TABLET | Freq: Every day | ORAL | Status: AC | PRN

## 2015-02-12 MED ORDER — METAXALONE 800 MG PO TABS: 400.0000 mg | ORAL_TABLET | Freq: Three times a day (TID) | ORAL | Status: AC

## 2015-02-12 NOTE — Patient Instructions (Signed)
Make sure you are moving around and not lying in bed all day! This will help the muscle spasm in your lower back. Also take the Skelaxin 3 times per day as needed. You can take a whole pill or a half pill. Applying heat to your lower back will also help.   Mobic is an anti-inflammatory that will help with the inflammation in your lower back. Take this once a day.  It is very important you take deep breaths to prevent your cough worsening. You should notice an improvement in symptoms with the antibiotic. If you do not improve within 3 days, come back to the clinic.  The cough medicine will also help with any pain. Take 1 teaspoon twice a day, as needed.  Mucinex will help thin your mucous secretions and it will make it easier to cough up the phlegm.  The albuterol inhaler will help you to take deep breaths. Use the inhaler as you need it, when you feel that you can't take a deep breath. Take 2 puffs every 6 hours.  Be sure to get plenty of rest and drink a lot of water!

## 2015-02-12 NOTE — Progress Notes (Signed)
Subjective:    Patient ID: Toni Williams, female    DOB: [birth year], 64 y.o.   MRN: [id number]  HPI  Pt presents to clinic for 2 concerns - 1- Cough that started about 4 days ago.  Cough has been productive for 4 days with a yellow-ish sputum production. Also notes a sore throat, but does not have any difficulty or pain with swallowing. Has not had much of an appetite, with her last meal of chicken noodle soup yesterday at lunch. Denies chills, fever, night sweats, shortness of breath, chest pain or tightness, nausea or vomiting. Does have some fatigue. Has not been sick recently and denies any sick contacts, but works at [employer] and said she "wouldn't be surprised if someone was sick" at work. Feels that the cough is coming more from her chest than her throat. Cough is present constantly throughout the day. She has not taken anything for cough. She had pneumonia 1.5-2 years ago, and said this was how her pneumonia started at that time. Has not had pneumonia since. She has no h/o asthma but her husband says she has been on inhalers in the past.  2- bilateral lumbar pain that also started about 4 days ago but does not seem to be related to the her cough.  Did not have any trauma or other injury to her back. Denies any heavy lifting or excessive moving at the time of onset. Pain is across her low back and does not radiate anywhere. Denies any numbness, tingling, loss of bowel or bladder function. She feels "stiffness after sitting for a long time and then getting up to move." She has tried 10 mg Flexeril for two nights which has helped her sleep, but did not relieve the pain. Also tried Advil PM with the same effect. Does not feel that her back pain is connected to her cough.   She had nasal polyp surgery done roughly 3 years ago, and she takes Flonase "to prevent the polyps from returning." She does not have other seasonal allergies that she is aware of.   Review of Systems  Constitutional:  Positive for appetite change and fatigue. Negative for fever, chills and diaphoresis.  HENT: Positive for sore throat. Negative for congestion, ear pain, postnasal drip, rhinorrhea, sinus pressure, sneezing and trouble swallowing.   Eyes: Negative for pain.  Respiratory: Positive for cough. Negative for chest tightness and shortness of breath.   Cardiovascular: Negative for chest pain and palpitations.  Gastrointestinal: Negative for nausea, vomiting, abdominal pain, diarrhea and constipation.  Genitourinary: Negative for dysuria, urgency and frequency.  Musculoskeletal: Positive for back pain (Low back pain). Negative for myalgias and arthralgias.  Neurological: Negative for headaches.       Objective:   Physical Exam  Constitutional: She is oriented to person, place, and time. She appears well-developed and well-nourished. No distress.  BP 130/78 mmHg  Pulse 121  Temp(Src) 99.9 F (37.7 C) (Oral)  Resp 18  Ht 5' 3.5" (1.613 m)  Wt 191 lb 9.6 oz (86.909 kg)  BMI 33.40 kg/m2  SpO2 95%  HENT:  Head: Normocephalic and atraumatic.  Right Ear: Hearing, tympanic membrane, external ear and ear canal normal.  Left Ear: Hearing, tympanic membrane, external ear and ear canal normal.  Nose: Mucosal edema (red) present.  Mouth/Throat: Uvula is midline and oropharynx is clear and moist. No oropharyngeal exudate or posterior oropharyngeal erythema.  Eyes: Conjunctivae are normal.  Neck: Normal range of motion. Neck supple.  Cardiovascular: Regular rhythm  and normal heart sounds.  Tachycardia present.   No murmur heard. Pulmonary/Chest: Effort normal. She has wheezes in the right lower field and the left lower field. She has rhonchi in the right lower field and the left lower field. She exhibits no tenderness.  Expiratory wheezing presents with forced expiration only.    Abdominal: Soft. Bowel sounds are normal. She exhibits no mass. There is no tenderness.  Musculoskeletal: She exhibits no  edema.       Lumbar back: She exhibits decreased range of motion (Pain with trunk flexion and extension. No pain with lateral flexion.) and pain. She exhibits no tenderness.  Lymphadenopathy:       Head (right side): No submental, no submandibular, no preauricular, no posterior auricular and no occipital adenopathy present.       Head (left side): No submental, no submandibular, no preauricular, no posterior auricular and no occipital adenopathy present.    She has cervical adenopathy.       Right cervical: No superficial cervical, no deep cervical and no posterior cervical adenopathy present.      Left cervical: No superficial cervical, no deep cervical and no posterior cervical adenopathy present.  Neurological: She is alert and oriented to person, place, and time. She has normal reflexes. She displays normal reflexes.  Reflex Scores:      Patellar reflexes are 2+ on the right side and 2+ on the left side.      Achilles reflexes are 2+ on the right side and 2+ on the left side. Skin: Skin is warm and dry. No erythema.  Psychiatric: She has a normal mood and affect. Her behavior is normal. Judgment and thought content normal.    Pt given albuterol neb in the office and her cough became more productive.  Her wheezing was only slightly improved.    Assessment & Plan:  1. Bronchospasm Albuterol inhaler done once in the office. Lung sounds mildly improved, but still had wheezing and tightness. - albuterol (PROVENTIL) (2.5 MG/3ML) 0.083% nebulizer solution 2.5 mg; Take 3 mLs (2.5 mg total) by nebulization once. - albuterol (PROVENTIL HFA;VENTOLIN HFA) 108 (90 BASE) MCG/ACT inhaler; Inhale 2 puffs into the lungs every 6 (six) hours as needed for wheezing or shortness of breath.  Dispense: 1 Inhaler; Refill: 2  2. Cough -  symptomatic treatment, increase fluids. - Dextromethorphan-Guaifenesin (MUCINEX DM MAXIMUM STRENGTH) 60-1200 MG TB12; Take 1 tablet by mouth 2 (two) times daily.  Dispense: 14  each; Refill: 0 - chlorpheniramine-HYDROcodone (TUSSIONEX PENNKINETIC ER) 10-8 MG/5ML SUER; Take 5 mLs by mouth 2 (two) times daily as needed for cough.  Dispense: 70 mL; Refill: 0  3. Acute bronchitis, unspecified organism Did not have constitutional symptoms that warranted a chest x-ray at this time. Treatment would not have changed based on chest x-ray findings. Instructed patient to take deep breaths and stay active to help with proper aeration of lungs to prevent worsening and possibility of PNA. - azithromycin (ZITHROMAX Z-PAK) 250 MG tablet; Use as directed  Dispense: 6 tablet; Refill: 0  4. Bilateral low back pain without sciatica Instructed to stay active and move around as much as possible.  Heat to the area. - metaxalone (SKELAXIN) 800 MG tablet; Take 0.5-1 tablets (400-800 mg total) by mouth 3 (three) times daily.  Dispense: 30 tablet; Refill: 0  5. Spasm of lumbar paraspinous muscle - meloxicam (MOBIC) 7.5 MG tablet; Take 1 tablet (7.5 mg total) by mouth daily as needed for pain.  Dispense: 30 tablet; Refill: 0 -  Instructed patient to apply heat to the area and to continue moving around.  The history and exam were initially performed by a student but I reinforced the history and repeated the exam.  Windell Hummingbird PA-C  [facility] 02/13/2015 11:54 AM

## 2015-02-13 ENCOUNTER — Encounter: Payer: Self-pay | Admitting: Physician Assistant

## 2015-02-13 NOTE — Progress Notes (Signed)
  Medical screening examination/treatment/procedure(s) were performed by non-physician practitioner and as supervising physician I was immediately available for consultation/collaboration.     

## 2015-04-20 ENCOUNTER — Ambulatory Visit (INDEPENDENT_AMBULATORY_CARE_PROVIDER_SITE_OTHER): Payer: BLUE CROSS/BLUE SHIELD | Admitting: Internal Medicine

## 2015-04-20 VITALS — BP 122/80 | HR 94 | Temp 98.1°F | Resp 18 | Ht 64.0 in | Wt 194.8 lb

## 2015-04-20 DIAGNOSIS — N61 Inflammatory disorders of breast: Secondary | ICD-10-CM

## 2015-04-20 DIAGNOSIS — L309 Dermatitis, unspecified: Secondary | ICD-10-CM | POA: Diagnosis not present

## 2015-04-20 MED ORDER — MUPIROCIN 2 % EX OINT: 1.0000 "application " | TOPICAL_OINTMENT | Freq: Three times a day (TID) | CUTANEOUS | Status: DC

## 2015-04-20 MED ORDER — DOXYCYCLINE HYCLATE 100 MG PO TABS: 100.0000 mg | ORAL_TABLET | Freq: Two times a day (BID) | ORAL | Status: DC

## 2015-04-20 NOTE — Patient Instructions (Signed)
Cellulitis Cellulitis is an infection of the skin and the tissue beneath it. The infected area is usually red and tender. Cellulitis occurs most often in the arms and lower legs.  CAUSES  Cellulitis is caused by bacteria that enter the skin through cracks or cuts in the skin. The most common types of bacteria that cause cellulitis are staphylococci and streptococci. SIGNS AND SYMPTOMS   Redness and warmth.  Swelling.  Tenderness or pain.  Fever. DIAGNOSIS  Your health care provider can usually determine what is wrong based on a physical exam. Blood tests may also be done. TREATMENT  Treatment usually involves taking an antibiotic medicine. HOME CARE INSTRUCTIONS   Take your antibiotic medicine as directed by your health care provider. Finish the antibiotic even if you start to feel better.  Keep the infected arm or leg elevated to reduce swelling.  Apply a warm cloth to the affected area up to 4 times per day to relieve pain.  Take medicines only as directed by your health care provider.  Keep all follow-up visits as directed by your health care provider. SEEK MEDICAL CARE IF:   You notice red streaks coming from the infected area.  Your red area gets larger or turns dark in color.  Your bone or joint underneath the infected area becomes painful after the skin has healed.  Your infection returns in the same area or another area.  You notice a swollen bump in the infected area.  You develop new symptoms.  You have a fever. SEEK IMMEDIATE MEDICAL CARE IF:   You feel very sleepy.  You develop vomiting or diarrhea.  You have a general ill feeling (malaise) with muscle aches and pains. MAKE SURE YOU:   Understand these instructions.  Will watch your condition.  Will get help right away if you are not doing well or get worse. Document Released: 07/26/2005 Document Revised: 03/02/2014 Document Reviewed: 01/01/2012 [facility] Patient Information 2015 [place], [place].  This information is not intended to replace advice given to you by your health care provider. Make sure you discuss any questions you have with your health care provider. Eczema Eczema, also called atopic dermatitis, is a skin disorder that causes inflammation of the skin. It causes a red rash and dry, scaly skin. The skin becomes very itchy. Eczema is generally worse during the cooler winter months and often improves with the warmth of summer. Eczema usually starts showing signs in infancy. Some children outgrow eczema, but it may last through adulthood.  CAUSES  The exact cause of eczema is not known, but it appears to run in families. People with eczema often have a family history of eczema, allergies, asthma, or hay fever. Eczema is not contagious. Flare-ups of the condition may be caused by:   Contact with something you are sensitive or allergic to.   Stress. SIGNS AND SYMPTOMS  Dry, scaly skin.   Red, itchy rash.   Itchiness. This may occur before the skin rash and may be very intense.  DIAGNOSIS  The diagnosis of eczema is usually made based on symptoms and medical history. TREATMENT  Eczema cannot be cured, but symptoms usually can be controlled with treatment and other strategies. A treatment plan might include:  Controlling the itching and scratching.   Use over-the-counter antihistamines as directed for itching. This is especially useful at night when the itching tends to be worse.   Use over-the-counter steroid creams as directed for itching.   Avoid scratching. Scratching makes the rash and  itching worse. It may also result in a skin infection (impetigo) due to a break in the skin caused by scratching.   Keeping the skin well moisturized with creams every day. This will seal in moisture and help prevent dryness. Lotions that contain alcohol and water should be avoided because they can dry the skin.   Limiting exposure to things that you are sensitive or allergic  to (allergens).   Recognizing situations that cause stress.   Developing a plan to manage stress.  HOME CARE INSTRUCTIONS   Only take over-the-counter or prescription medicines as directed by your health care provider.   Do not use anything on the skin without checking with your health care provider.   Keep baths or showers short (5 minutes) in warm (not hot) water. Use mild cleansers for bathing. These should be unscented. You may add nonperfumed bath oil to the bath water. It is best to avoid soap and bubble bath.   Immediately after a bath or shower, when the skin is still damp, apply a moisturizing ointment to the entire body. This ointment should be a petroleum ointment. This will seal in moisture and help prevent dryness. The thicker the ointment, the better. These should be unscented.   Keep fingernails cut short. Children with eczema may need to wear soft gloves or mittens at night after applying an ointment.   Dress in clothes made of cotton or cotton blends. Dress lightly, because heat increases itching.   A child with eczema should stay away from anyone with fever blisters or cold sores. The virus that causes fever blisters (herpes simplex) can cause a serious skin infection in children with eczema. SEEK MEDICAL CARE IF:   Your itching interferes with sleep.   Your rash gets worse or is not better within 1 week after starting treatment.   You see pus or soft yellow scabs in the rash area.   You have a fever.   You have a rash flare-up after contact with someone who has fever blisters.  Document Released: 10/13/2000 Document Revised: 08/06/2013 Document Reviewed: 05/19/2013 [facility] Patient Information 2015 [place], [place]. This information is not intended to replace advice given to you by your health care provider. Make sure you discuss any questions you have with your health care provider.

## 2015-04-20 NOTE — Progress Notes (Signed)
   Subjective:    Patient ID: Toni Williams, female    DOB: May 14, 1951, 64 y.o.   MRN: [id number]  HPI This note is scribed by [facility] for Dr. Reesa Chew 64 y.o. Female c/o dry skin all over her body. She says it's itchy. She uses Eucerin lotion.  She c/o a mole that was on her right breast that was scratched off.She tried to put  Neosporin on it to help the wound. She is up to date on her mammograms.   Review of Systems     Objective:   Physical Exam        Assessment & Plan:

## 2015-05-24 ENCOUNTER — Encounter: Payer: Self-pay | Admitting: Family Medicine

## 2015-05-24 ENCOUNTER — Ambulatory Visit (INDEPENDENT_AMBULATORY_CARE_PROVIDER_SITE_OTHER): Payer: BLUE CROSS/BLUE SHIELD | Admitting: Family Medicine

## 2015-05-24 VITALS — BP 136/85 | HR 84 | Temp 98.3°F | Resp 16 | Ht 64.5 in | Wt 191.8 lb

## 2015-05-24 DIAGNOSIS — L309 Dermatitis, unspecified: Secondary | ICD-10-CM | POA: Diagnosis not present

## 2015-05-24 DIAGNOSIS — N6320 Unspecified lump in the left breast, unspecified quadrant: Secondary | ICD-10-CM

## 2015-05-24 DIAGNOSIS — N63 Unspecified lump in breast: Secondary | ICD-10-CM

## 2015-05-24 MED ORDER — PREDNISONE 20 MG PO TABS: ORAL_TABLET | ORAL | Status: DC

## 2015-05-24 MED ORDER — TRIAMCINOLONE ACETONIDE 0.1 % EX CREA: 1.0000 "application " | TOPICAL_CREAM | Freq: Two times a day (BID) | CUTANEOUS | Status: DC

## 2015-05-24 NOTE — Patient Instructions (Signed)
We will set up a diagnostic mammogram for you- please call if you do not hear about this appt soon! I'm sorry that your eczema is so severe Minimize exposure to water (esp hot water) as this can make you worse!   Quick, lukewarm showers are best.   Try the triamcinolone cream- you can mix with a moisturizer as well if you wish.  This is easier to use over large areas of skin If you like, you can take the short course of prednisone that I prescribed for itching You might also try using benadryl at night for itching Wearing gloves over your steroid treatment at night may help your hands.

## 2015-05-24 NOTE — Progress Notes (Signed)
[facility] [address], [place] [address] [phone]  Date:  05/24/2015   Name:  Toni Williams   DOB:  29-Oct-1951   MRN:  [id number]  PCP:  Leandrew Koyanagi, MD    Chief Complaint: Breast Mass; Eczema; and Establish Care   History of Present Illness:  Toni Williams is a 64 y.o. very pleasant female patient who presents with the following:  Here to establish care with me.  Also history of breast cancer.   She has noted a lump on her left chest wall for 4-5 days.  It seems to be getting smaller.  She did not notice any drainage but did try an antibiotic  She had history of DCIS dx in 2008.  She had a lumpectomy, and then another lumpectomy in 2009.  She finished her radiation therapy in 2009.  No chemo done. She has done well since then, she does get mammograms regulalry.    She has eczema- this is 'bad."  She has had this for her entire life, but is worse for the last couple of months. Tends to be worse in the summer months.  She has seen derm, they tried prednisone for 10 days. She took this about one month ago, it helped for a little while but not long term.  It did help with her chronic itching.   She currently uses vaseline, fluocinolone scalp oil and clobetasol as needed She also feels like her left thigh "feels different" from the right- she has noted this for about one month.  It is not numb and there is not any weakness.  She cannot really describe this further  Patient Active Problem List   Diagnosis Date Noted  . Chest pain 05/17/2012  . Abnormal resting ECG findings 04/24/2012  . Vitamin D deficiency 04/24/2012  . Hypertension 02/11/2012  . Breast cancer 02/11/2012  . Eczema 02/11/2012  . Allergic rhinitis due to allergen 02/11/2012    Past Medical History  Diagnosis Date  . Cancer   . Missed abortion     x 1  . Termination of pregnancy     x 1  . Seasonal allergies   . Hypertension   . Blood transfusion 1979    with  missed abortion in [facility] [place]  . Anxiety     no meds    Past Surgical History  Procedure Laterality Date  . Cholecystectomy    . Wisdom tooth extraction    . Cesarean section    . Colonscopy    . Dilation and curettage of uterus    . Breast surgery  10/03/2007, 11/06/2007    Left breast excision, re-excision Left breast  . Breast surgery      No IV sticks/Blood Draws/Blood Pressure Left Arm,  . Hysteroscopy w/d&c  10/27/2011    Procedure: DILATATION AND CURETTAGE /HYSTEROSCOPY;  Surgeon: Delice Lesch, MD;  Location: [facility];  Service: Gynecology;  Laterality: N/A;    History  Substance Use Topics  . Smoking status: Former Smoker -- 0.25 packs/day for 2 years    Types: Cigarettes    Quit date: 10/30/1976  . Smokeless tobacco: Never Used  . Alcohol Use: No    Family History  Problem Relation Age of Onset  . Cancer Mother 82    breast (R),stomach, and uterine  . Cancer Father 50    lung from smoking  3 pks/day  . Diabetes Maternal Grandmother     amputation of (  L) leg  . Stroke Maternal Grandfather 60  . Diabetes Brother     Allergies  Allergen Reactions  . Morphine And Related Other (See Comments)    "psychotic" reaction per pt report  . Latex Rash    itching    Medication list has been reviewed and updated.  Current Outpatient Prescriptions on File Prior to Visit  Medication Sig Dispense Refill  . azelastine (ASTELIN) 0.1 % nasal spray Place 1 spray into both nostrils 2 (two) times daily. Use in each nostril as directed 30 mL 11  . cholecalciferol (VITAMIN D) 1000 UNITS tablet Take 2,000 Units by mouth daily.      . clobetasol ointment (TEMOVATE) 1.66 % Apply 1 application topically 2 (two) times daily. 60 g 5  . FLUOCINOLONE ACETONIDE SCALP 0.01 % OIL Apply 1 application topically daily. To scalp. Do not use on face, armpits, groin 118 mL 0  . fluticasone (FLONASE) 50 MCG/ACT nasal spray Place 2 sprays into both nostrils daily. 16 g 11  .  hydrochlorothiazide (HYDRODIURIL) 25 MG tablet Take 1 tablet (25 mg total) by mouth daily. 90 tablet 3  . mupirocin ointment (BACTROBAN) 2 % Apply 1 application topically 3 (three) times daily. 30 g 3   No current facility-administered medications on file prior to visit.    Review of Systems:  As per HPI- otherwise negative.   Physical Examination: Filed Vitals:   05/24/15 0920  BP: 136/85  Pulse: 84  Temp: 98.3 F (36.8 C)  Resp: 16   Filed Vitals:   05/24/15 0920  Height: 5' 4.5" (1.638 m)  Weight: 191 lb 12.8 oz (87 kg)   Body mass index is 32.43 kg/(m^2). Ideal Body Weight: Weight in (lb) to have BMI = 25: 147.6  GEN: WDWN, NAD, Non-toxic, A & O x 3, obese, looks well HEENT: Atraumatic, Normocephalic. Neck supple. No masses, No LAD.  Bilateral TM wnl, oropharynx normal.  PEERL,EOMI.   Ears and Nose: No external deformity. CV: RRR, No M/G/R. No JVD. No thrill. No extra heart sounds. PULM: CTA B, no wheezes, crackles, rhonchi. No retractions. No resp. distress. No accessory muscle use. ABD: S, NT, ND, large old chole scar EXTR: No c/c/e NEURO Normal gait.  PSYCH: Normally interactive. Conversant. Not depressed or anxious appearing.  Calm demeanor.  She has significant widespread eczema, worse on her hands, with thickening and lichenification of her skin Breast: normal exam, no masses/ dimpling/ discharge RIGHT LEFT breast shows an approx 2x3cm mobile mass in the axilla lateral to the breast.  It is non- tender, non- fluctuant  Assessment and Plan: Left breast lump - Plan: MM Digital Diagnostic Unilat L  Eczema - Plan: triamcinolone cream (KENALOG) 0.1 %, predniSONE (DELTASONE) 20 MG tablet  Referral for diagnostic mammo Discussed her eczema- unfortunately she has a very bad case.  Discussed management techniques including avoiding water exposure, use of moisturizers, and use of steroids.  She can try another 6 days of prednisone if she likes for itching See patient  instructions for more details.      Signed Lamar Blinks, MD

## 2015-05-27 ENCOUNTER — Other Ambulatory Visit: Payer: Self-pay | Admitting: Family Medicine

## 2015-05-27 DIAGNOSIS — N6320 Unspecified lump in the left breast, unspecified quadrant: Secondary | ICD-10-CM

## 2015-09-13 ENCOUNTER — Ambulatory Visit (INDEPENDENT_AMBULATORY_CARE_PROVIDER_SITE_OTHER): Payer: Medicare Other | Admitting: Family Medicine

## 2015-09-13 VITALS — BP 120/78 | HR 92 | Temp 97.9°F | Resp 14 | Ht 65.0 in | Wt 197.8 lb

## 2015-09-13 DIAGNOSIS — L03213 Periorbital cellulitis: Secondary | ICD-10-CM | POA: Diagnosis not present

## 2015-09-13 MED ORDER — CEFTRIAXONE SODIUM 1 G IJ SOLR
1.0000 g | Freq: Once | INTRAMUSCULAR | Status: AC
  Administered 2015-09-13: 1 g via INTRAMUSCULAR

## 2015-09-13 MED ORDER — DOXYCYCLINE HYCLATE 100 MG PO TABS: 100.0000 mg | ORAL_TABLET | Freq: Two times a day (BID) | ORAL | Status: DC

## 2015-09-13 NOTE — Patient Instructions (Signed)
Preseptal Cellulitis, Adult °Preseptal cellulitis--also called periorbital cellulitis--is an infection that can affect your eyelid and the soft tissues or skin that surround your eye. The infection may also affect the structures that produce and drain your tears. It does not affect your eye itself. °CAUSES °This condition may be caused by: °· Bacterial infection. °· Long-term (chronic) sinus infections. °· An object (foreign body) that is stuck behind the eye. °· An injury that: °¨ Goes through the eyelid tissues. °¨ Causes an infection, such as an insect sting. °· Fracture of the bone around the eye. °· Infections that have spread from the eyelid or other structures around the eye. °· Bite wounds. °· Inflammation or infection of the lining membranes of the brain (meningitis). °· An infection in the blood (septicemia). °· Dental infection (abscess). °· Viral infection. This is rare. °RISK FACTORS °Risk factors for preseptal cellulitis include: °· Participating in activities that increase your risk of trauma to the face or head, such as boxing or high-speed activities. °· Having a weakened defense system (immune system). °· Medical conditions, such as nasal polyps, that increase your risk for frequent or recurrent sinus infections. °· Not receiving regular dental care. °SYMPTOMS °Symptoms of this condition usually come on suddenly. Symptoms may include: °· Red, hot, and swollen eyelids. °· Fever. °· Difficulty opening your eye. °· Eye pain. °DIAGNOSIS °This condition may be diagnosed by an eye exam. You may also have tests, such as: °· Blood tests. °· CT scan. °· MRI. °· Spinal tap (lumbar puncture). This is a procedure that involves removing and examining a small amount of the fluid that surrounds the brain and spinal cord. This checks for meningitis. °TREATMENT °Treatment for this condition will include antibiotic medicines. These may be given by mouth (orally), through an IV, or as a shot. Your health care  provider may also recommend nasal decongestants to reduce swelling. °HOME CARE INSTRUCTIONS °· Take your antibiotic medicine as directed by your health care provider. Finish all of it even if you start to feel better. °· Take medicines only as directed by your health care provider. °· Drink enough fluid to keep your urine clear or pale yellow. °· Do not use any tobacco products, including cigarettes, chewing tobacco, or electronic cigarettes. If you need help quitting, ask your health care provider. °· Keep all follow-up visits as directed by your health care provider. These include any visits with an eye specialist (ophthalmologist) or dentist. °SEEK MEDICAL CARE IF: °· You have a fever. °· Your eyelids become more red, warm, or swollen. °· You have new symptoms. °· Your symptoms do not get better with treatment. °SEEK IMMEDIATE MEDICAL CARE IF: °· You develop double vision, or your vision becomes blurred or worsens in any way. °· You have trouble moving your eyes. °· Your eye looks like it is sticking out or bulging out (proptosis). °· You develop a severe headache, severe neck pain, or neck stiffness. °· You develop repeated vomiting. °  °This information is not intended to replace advice given to you by your health care provider. Make sure you discuss any questions you have with your health care provider. °  °Document Released: 11/18/2010 Document Revised: 03/02/2015 Document Reviewed: 10/12/2014 °Elsevier Interactive Patient Education ©2016 [employer] ° °

## 2015-09-13 NOTE — Progress Notes (Addendum)
This chart was scribed for Robyn Haber, MD by Moises Blood, medical scribe at [facility].The patient was seen in exam room 2 and the patient's care was started at 8:27 AM.  Patient ID: Toni Williams MRN: [id number], DOB: 10/05/1951, 64 y.o. Date of Encounter: 09/13/2015  Primary Physician: Lamar Blinks, MD  Chief Complaint:  Chief Complaint  Patient presents with  . Edema    Right Eye, Started this morning  . Itchy Eye    Right Eye     HPI:  Toni Williams is a 64 y.o. female who presents to [facility] complaining of right eye swelling with itchiness 3 nights ago. She couldn't open her eye yesterday. She denies any eye pain.   She works as a Research scientist (physical sciences) at [employer]   Past Medical History  Diagnosis Date  . Cancer ([place])   . Missed abortion     x 1  . Termination of pregnancy     x 1  . Seasonal allergies   . Hypertension   . Blood transfusion 1979    with missed abortion in [facility]  . Anxiety     no meds     Home Meds: Prior to Admission medications   Medication Sig Start Date End Date Taking? Authorizing Provider  azelastine (ASTELIN) 0.1 % nasal spray Place 1 spray into both nostrils 2 (two) times daily. Use in each nostril as directed 10/10/14  Yes Shawnee Knapp, MD  cholecalciferol (VITAMIN D) 1000 UNITS tablet Take 2,000 Units by mouth daily.     Yes Historical Provider, MD  clobetasol ointment (TEMOVATE) [id number] % Apply 1 application topically 2 (two) times daily. 10/10/14  Yes Shawnee Knapp, MD  fluticasone (FLONASE) 50 MCG/ACT nasal spray Place 2 sprays into both nostrils daily. 10/10/14  Yes Shawnee Knapp, MD  hydrochlorothiazide (HYDRODIURIL) 25 MG tablet Take 1 tablet (25 mg total) by mouth daily. 10/10/14  Yes Shawnee Knapp, MD  triamcinolone cream (KENALOG) 0.1 % Apply 1 application topically 2 (two) times daily. May mix with eucerin at home if desired.  Use as needed 05/24/15  Yes  Jessica C Copland, MD  FLUOCINOLONE ACETONIDE SCALP 0.01 % OIL Apply 1 application topically daily. To scalp. Do not use on face, armpits, groin Patient not taking: Reported on 09/13/2015 10/10/14   Shawnee Knapp, MD  mupirocin ointment (BACTROBAN) 2 % Apply 1 application topically 3 (three) times daily. Patient not taking: Reported on 09/13/2015 04/20/15   Orma Flaming, MD  predniSONE (DELTASONE) 20 MG tablet Take 2 pills by mouth daily for 3 days, then 1 pill by mouth daily for 3 days Patient not taking: Reported on 09/13/2015 05/24/15   Darreld Mclean, MD    Allergies:  Allergies  Allergen Reactions  . Morphine And Related Other (See Comments)    "psychotic" reaction per pt report  . Latex Rash    itching    Social History   Social History  . Marital Status: Married    Spouse Name: N/A  . Number of Children: N/A  . Years of Education: N/A   Occupational History  . Not on file.   Social History Main Topics  . Smoking status: Former Smoker -- 0.25 packs/day for 2 years    Types: Cigarettes    Quit date: 10/30/1976  . Smokeless tobacco: Never Used  . Alcohol Use: No  . Drug Use: No  . Sexual  Activity: Not Currently    Birth Control/ Protection: None   Other Topics Concern  . Not on file   Social History Narrative     Review of Systems: Constitutional: negative for fever, chills, night sweats, weight changes, or fatigue  HEENT: negative for vision changes, hearing loss, congestion, rhinorrhea, ST, epistaxis, or sinus pressure; positive for eye swelling (right), eye itchy (right)  Cardiovascular: negative for chest pain or palpitations Respiratory: negative for hemoptysis, wheezing, shortness of breath, or cough Abdominal: negative for abdominal pain, nausea, vomiting, diarrhea, or constipation Dermatological: negative for rash Neurologic: negative for headache, dizziness, or syncope All other systems reviewed and are otherwise negative with the exception to those  above and in the HPI.  Physical Exam: Blood pressure 120/78, pulse 92, temperature 97.9 F (36.6 C), temperature source Oral, resp. rate 14, height 5\' 5"  (1.651 m), weight 197 lb 12.8 oz (89.721 kg), SpO2 95 %., Body mass index is 32.92 kg/(m^2). General: Well developed, well nourished, in no acute distress. Head: Normocephalic, atraumatic, nares are without discharge. Bilateral auditory canals clear, TM's are without perforation, pearly grey and translucent with reflective cone of light bilaterally. Oral cavity moist, posterior pharynx without exudate, erythema, peritonsillar abscess, or post nasal drip; both upper and lower right eyelids swollen Neck: Supple. No thyromegaly. Full ROM. No lymphadenopathy. Lungs: Clear bilaterally to auscultation without wheezes, rales, or rhonchi. Breathing is unlabored. Msk:  Strength and tone normal for age. Extremities/Skin: Warm and dry. No clubbing or cyanosis. No edema. No rashes or suspicious lesions. Neuro: Alert and oriented X 3. Moves all extremities spontaneously. Gait is normal. CNII-XII grossly in tact. Psych:  Responds to questions appropriately with a normal affect.    ASSESSMENT AND PLAN:  64 y.o. year old female with  This chart was scribed in my presence and reviewed by me personally.    ICD-9-CM ICD-10-CM   1. Periorbital cellulitis of right eye 682.0 L03.213 cefTRIAXone (ROCEPHIN) injection 1 g     doxycycline (VIBRA-TABS) 100 MG tablet    By signing my name below, I, Moises Blood, attest that this documentation has been prepared under the direction and in the presence of Robyn Haber, MD. Electronically Signed: Moises Blood, Scribe. 09/13/2015 , 8:27 AM .  Signed, Robyn Haber, MD 09/13/2015 8:27 AM

## 2015-09-27 DIAGNOSIS — Z853 Personal history of malignant neoplasm of breast: Secondary | ICD-10-CM | POA: Diagnosis not present

## 2015-09-30 ENCOUNTER — Other Ambulatory Visit: Payer: Self-pay | Admitting: Radiology

## 2015-09-30 DIAGNOSIS — R920 Mammographic microcalcification found on diagnostic imaging of breast: Secondary | ICD-10-CM | POA: Diagnosis not present

## 2015-09-30 DIAGNOSIS — N6032 Fibrosclerosis of left breast: Secondary | ICD-10-CM | POA: Diagnosis not present

## 2015-09-30 DIAGNOSIS — N6012 Diffuse cystic mastopathy of left breast: Secondary | ICD-10-CM | POA: Diagnosis not present

## 2015-10-08 ENCOUNTER — Ambulatory Visit (INDEPENDENT_AMBULATORY_CARE_PROVIDER_SITE_OTHER): Payer: Medicare Other | Admitting: Physician Assistant

## 2015-10-08 VITALS — BP 138/80 | HR 100 | Temp 98.2°F | Resp 17 | Ht 64.0 in | Wt 200.2 lb

## 2015-10-08 DIAGNOSIS — L309 Dermatitis, unspecified: Secondary | ICD-10-CM

## 2015-10-08 MED ORDER — TRIAMCINOLONE ACETONIDE 0.5 % EX OINT: 1.0000 "application " | TOPICAL_OINTMENT | Freq: Two times a day (BID) | CUTANEOUS | Status: DC

## 2015-10-08 NOTE — Progress Notes (Signed)
   Subjective:    Patient ID: Toni Williams, female    DOB: Nov 02, 1950, 64 y.o.   MRN: [id number]  Chief Complaint  Patient presents with  . Eczema    hands   Medications, allergies, past medical history, surgical history, family history, social history and problem list reviewed and updated.  HPI  62 yof presents with ongoing eczema. She has been seen several times in the past for her eczema and has a dermatologist that she has seen in the past for this.   She has triamcinolone 0.1 at home but has not been using past several weeks. She also has Eucerin at home but only uses sporadically. She is employed as a Research scientist (physical sciences).   Review of Systems Denies fevers, chills.     Objective:   Physical Exam  Constitutional: She is oriented to person, place, and time. She appears well-developed and well-nourished.  Non-toxic appearance. She does not have a sickly appearance. She does not appear ill. No distress.  BP 138/80 mmHg  Pulse 100  Temp(Src) 98.2 F (36.8 C) (Oral)  Resp 17  Ht 5\' 4"  (1.626 m)  Wt 200 lb 3.2 oz (90.81 kg)  BMI 34.35 kg/m2  SpO2 98%   Neurological: She is alert and oriented to person, place, and time.  Skin:  Dry, scaly, excoriated skin diffuse across bilateral hands and up to bilateral elbows. No erythema, purulence, or induration. No warmth.   Psychiatric: She has a normal mood and affect. Her speech is normal and behavior is normal.      Assessment & Plan:   Eczema - Plan: triamcinolone ointment (KENALOG) 0.5 % --worsening eczema --kenalog 0.5% bid for 2 weeks followed by kenalog 0.1% bid for 2 weeks --Eucerin multiple times daily, particularly after water exposure --encouraged f/u with her dermatologist for possibility of more aggressive measures  Julieta Gutting, PA-C Physician Assistant-Certified [facility]  10/08/2015 6:31 PM

## 2015-10-08 NOTE — Patient Instructions (Signed)
Please apply the new triamcinolone cream (0.5%) twice daily to your hands and arms.  Only use this new topical for 2 weeks maximum, then resume using your regular triamcinolone (0.1%) twice daily for 2 more weeks.  Do not apply these to the face.  Apply Eucerin or Cetaphil cream multiple times daily to the dry areas.  Be sure to do this after showering/bathing, after washing your hands, and after exposing your hands to water.  It is important to apply moisturizing cream daily to your hands and arms. I also recommend scheduling yourself to see your dermatologist for their input.   Eczema Eczema, also called atopic dermatitis, is a skin disorder that causes inflammation of the skin. It causes a red rash and dry, scaly skin. The skin becomes very itchy. Eczema is generally worse during the cooler winter months and often improves with the warmth of summer. Eczema usually starts showing signs in infancy. Some children outgrow eczema, but it may last through adulthood.  CAUSES  The exact cause of eczema is not known, but it appears to run in families. People with eczema often have a family history of eczema, allergies, asthma, or hay fever. Eczema is not contagious. Flare-ups of the condition may be caused by:   Contact with something you are sensitive or allergic to.   Stress. SIGNS AND SYMPTOMS  Dry, scaly skin.   Red, itchy rash.   Itchiness. This may occur before the skin rash and may be very intense.  DIAGNOSIS  The diagnosis of eczema is usually made based on symptoms and medical history. TREATMENT  Eczema cannot be cured, but symptoms usually can be controlled with treatment and other strategies. A treatment plan might include:  Controlling the itching and scratching.   Use over-the-counter antihistamines as directed for itching. This is especially useful at night when the itching tends to be worse.   Use over-the-counter steroid creams as directed for itching.   Avoid  scratching. Scratching makes the rash and itching worse. It may also result in a skin infection (impetigo) due to a break in the skin caused by scratching.   Keeping the skin well moisturized with creams every day. This will seal in moisture and help prevent dryness. Lotions that contain alcohol and water should be avoided because they can dry the skin.   Limiting exposure to things that you are sensitive or allergic to (allergens).   Recognizing situations that cause stress.   Developing a plan to manage stress.  HOME CARE INSTRUCTIONS   Only take over-the-counter or prescription medicines as directed by your health care provider.   Do not use anything on the skin without checking with your health care provider.   Keep baths or showers short (5 minutes) in warm (not hot) water. Use mild cleansers for bathing. These should be unscented. You may add nonperfumed bath oil to the bath water. It is best to avoid soap and bubble bath.   Immediately after a bath or shower, when the skin is still damp, apply a moisturizing ointment to the entire body. This ointment should be a petroleum ointment. This will seal in moisture and help prevent dryness. The thicker the ointment, the better. These should be unscented.   Keep fingernails cut short. Children with eczema may need to wear soft gloves or mittens at night after applying an ointment.   Dress in clothes made of cotton or cotton blends. Dress lightly, because heat increases itching.   A child with eczema should stay away  from anyone with fever blisters or cold sores. The virus that causes fever blisters (herpes simplex) can cause a serious skin infection in children with eczema. SEEK MEDICAL CARE IF:   Your itching interferes with sleep.   Your rash gets worse or is not better within 1 week after starting treatment.   You see pus or soft yellow scabs in the rash area.   You have a fever.   You have a rash flare-up after  contact with someone who has fever blisters.    This information is not intended to replace advice given to you by your health care provider. Make sure you discuss any questions you have with your health care provider.   Document Released: 10/13/2000 Document Revised: 08/06/2013 Document Reviewed: 05/19/2013 Elsevier Interactive Patient Education [employer].

## 2015-10-13 ENCOUNTER — Encounter: Payer: Self-pay | Admitting: Family Medicine

## 2015-10-21 ENCOUNTER — Encounter: Payer: Self-pay | Admitting: Family Medicine

## 2015-11-19 ENCOUNTER — Encounter: Payer: Self-pay | Admitting: Family Medicine

## 2015-11-23 ENCOUNTER — Other Ambulatory Visit: Payer: Self-pay | Admitting: Family Medicine

## 2015-11-24 ENCOUNTER — Encounter: Payer: Self-pay | Admitting: Family Medicine

## 2016-01-02 ENCOUNTER — Other Ambulatory Visit: Payer: Self-pay | Admitting: Physician Assistant

## 2016-01-09 ENCOUNTER — Telehealth: Payer: Self-pay

## 2016-01-09 NOTE — Telephone Encounter (Signed)
Patient is calling because she need enough hctz 25MG  to last until her appointment on 3/29 with Dr. Carlota Raspberry

## 2016-01-11 MED ORDER — HYDROCHLOROTHIAZIDE 25 MG PO TABS
ORAL_TABLET | ORAL | Status: DC
Start: 2016-01-11 — End: 2016-01-26

## 2016-01-11 NOTE — Telephone Encounter (Signed)
DOne

## 2016-01-26 ENCOUNTER — Encounter: Payer: Self-pay | Admitting: Family Medicine

## 2016-01-26 ENCOUNTER — Ambulatory Visit (INDEPENDENT_AMBULATORY_CARE_PROVIDER_SITE_OTHER): Payer: Medicare Other | Admitting: Family Medicine

## 2016-01-26 VITALS — BP 124/82 | HR 85 | Temp 98.2°F | Resp 16 | Ht 64.25 in | Wt 199.6 lb

## 2016-01-26 DIAGNOSIS — I10 Essential (primary) hypertension: Secondary | ICD-10-CM

## 2016-01-26 DIAGNOSIS — L309 Dermatitis, unspecified: Secondary | ICD-10-CM | POA: Diagnosis not present

## 2016-01-26 LAB — BASIC METABOLIC PANEL
BUN: 17 mg/dL (ref 7–25)
CALCIUM: 9.8 mg/dL (ref 8.6–10.4)
CO2: 27 mmol/L (ref 20–31)
Chloride: 103 mmol/L (ref 98–110)
Creat: 0.63 mg/dL (ref 0.50–0.99)
GLUCOSE: 93 mg/dL (ref 65–99)
Potassium: 4 mmol/L (ref 3.5–5.3)
SODIUM: 141 mmol/L (ref 135–146)

## 2016-01-26 MED ORDER — HYDROCHLOROTHIAZIDE 25 MG PO TABS: ORAL_TABLET | ORAL | Status: DC

## 2016-01-26 MED ORDER — TRIAMCINOLONE ACETONIDE 0.1 % EX OINT: 1.0000 "application " | TOPICAL_OINTMENT | Freq: Two times a day (BID) | CUTANEOUS | Status: DC | PRN

## 2016-01-26 NOTE — Patient Instructions (Addendum)
     IF you received an x-ray today, you will receive an invoice from [facility]. Please contact [facility] at [phone] with questions or concerns regarding your invoice.   IF you received labwork today, you will receive an invoice from [employer]. Please contact [facility] at [phone] with questions or concerns regarding your invoice.   Our billing staff will not be able to assist you with questions regarding bills from these companies.  You will be contacted with the lab results as soon as they are available. The fastest way to get your results is to activate your My Chart account. Instructions are located on the last page of this paperwork. If you have not heard from [place] regarding the results in 2 weeks, please contact this office.     Kenalog up to twice per day for itching. Eucerin or Lubriderm throughout the day - especially after bathing.   Same dose of blood pressure med for now - recheck in next 6 months for physical.   Return to the clinic or go to the nearest emergency room if any of your symptoms worsen or new symptoms occur.

## 2016-01-26 NOTE — Progress Notes (Signed)
Subjective:    Patient ID: Toni Williams, female    DOB: Oct 15, 1951, 65 y.o.   MRN: [id number] By signing my name below, I, Zola Button, attest that this documentation has been prepared under the direction and in the presence of Merri Ray, MD.  Electronically Signed: Zola Button, Medical Scribe. 01/26/2016. 12:05 PM.  HPI HPI Comments: Toni Williams is a 65 y.o. female with a history of eczema and hypertension who presents to the [facility] for a medication refill for her blood pressure medications. Previous patient of Dr. Lorelei Pont, new to me. She last ate about 3.5 hours ago.  Eczema: Last visit December 2016, primarily in the hands at that time. Has been followed by dermatology in the past. Has used triamcinolone cream 0.1% and Eucerin. At that visit, Kenalog 0.5% BID for 2 weeks, followed by 0.1% BID. Patient is wondering if she can get the triamcinolone ointment instead of the cream because she states the ointment works better. She does not use the triamcinolone every day. She has also been using Cetaphil. She accidentally cut her right 2nd finger a few days ago.  Hypertension: She takes HCTZ once a day. Elevated glucose 103 at [facility] December 2015. She has not had any blood pressure issues at home and states it has been getting normal blood pressure readings at home, close to her blood pressure in the office today which is 124/82. Patient denies headache, abdominal pain, blood in stool, and lightheadedness.  Lab Results  Component Value Date   CREATININE 0.68 10/10/2014     Patient Active Problem List   Diagnosis Date Noted  . Chest pain 05/17/2012  . Abnormal resting ECG findings 04/24/2012  . Vitamin D deficiency 04/24/2012  . Hypertension 02/11/2012  . Breast cancer ([place]) 02/11/2012  . Eczema 02/11/2012  . Allergic rhinitis due to allergen 02/11/2012   Past Medical History  Diagnosis Date  . Cancer ([place])   . Missed abortion     x 1  .  Termination of pregnancy     x 1  . Seasonal allergies   . Hypertension   . Blood transfusion 1979    with missed abortion in [facility] -Dr He[facility] [place]  . Anxiety     no meds   Past Surgical History  Procedure Laterality Date  . Cholecystectomy    . Wisdom tooth extraction    . Cesarean section    . Colonscopy    . Dilation and curettage of uterus    . Breast surgery  10/03/2007, 11/06/2007    Left breast excision, re-excision Left breast  . Breast surgery      No IV sticks/Blood Draws/Blood Pressure Left Arm,  . Hysteroscopy w/d&c  10/27/2011    Procedure: DILATATION AND CURETTAGE /HYSTEROSCOPY;  Surgeon: Delice Lesch, MD;  Location: [facility];  Service: Gynecology;  Laterality: N/A;   Allergies  Allergen Reactions  . Morphine And Related Other (See Comments)    "psychotic" reaction per pt report  . Latex Rash    itching   Prior to Admission medications   Medication Sig Start Date End Date Taking? Authorizing Provider  azelastine (ASTELIN) 0.1 % nasal spray Place 1 spray into both nostrils 2 (two) times daily. Use in each nostril as directed 10/10/14  Yes Shawnee Knapp, MD  cholecalciferol (VITAMIN D) 1000 UNITS tablet Take 2,000 Units by mouth daily.     Yes Historical Provider, MD  fluticasone (FLONASE) 50 MCG/ACT nasal spray Place 2  sprays into both nostrils daily. 10/10/14  Yes Shawnee Knapp, MD  hydrochlorothiazide (HYDRODIURIL) 25 MG tablet TAKE 1 TABLET (25 MG TOTAL) BY MOUTH DAILY. 01/11/16  Yes Chelle Jeffery, PA-C  triamcinolone ointment (KENALOG) 0.5 % Apply 1 application topically 2 (two) times daily. 10/08/15  Yes Todd McVeigh, PA  clobetasol ointment (TEMOVATE) [id number] % Apply 1 application topically 2 (two) times daily. Patient not taking: Reported on 01/26/2016 10/10/14   Shawnee Knapp, MD  FLUOCINOLONE ACETONIDE SCALP 0.01 % OIL Apply 1 application topically daily. To scalp. Do not use on face, armpits, groin Patient not taking: Reported on 10/08/2015 10/10/14   Shawnee Knapp, MD    triamcinolone cream (KENALOG) 0.1 % Apply 1 application topically 2 (two) times daily. May mix with eucerin at home if desired.  Use as needed Patient not taking: Reported on 01/26/2016 05/24/15   Darreld Mclean, MD   Social History   Social History  . Marital Status: Married    Spouse Name: N/A  . Number of Children: N/A  . Years of Education: N/A   Occupational History  . Not on file.   Social History Main Topics  . Smoking status: Former Smoker -- 0.25 packs/day for 2 years    Types: Cigarettes    Quit date: 10/30/1976  . Smokeless tobacco: Never Used  . Alcohol Use: No  . Drug Use: No  . Sexual Activity: Not Currently    Birth Control/ Protection: None   Other Topics Concern  . Not on file   Social History Narrative     Review of Systems  HENT: Negative for sinus pressure.   Gastrointestinal: Negative for abdominal pain and blood in stool.  Skin: Positive for rash.  Neurological: Negative for dizziness and headaches.       Objective:   Physical Exam  Constitutional: She is oriented to person, place, and time. She appears well-developed and well-nourished.  HENT:  Head: Normocephalic and atraumatic.  Eyes: Conjunctivae and EOM are normal. Pupils are equal, round, and reactive to light.  Neck: Carotid bruit is not present.  Cardiovascular: Normal rate, regular rhythm, normal heart sounds and intact distal pulses.   Pulmonary/Chest: Effort normal and breath sounds normal.  Abdominal: Soft. She exhibits no pulsatile midline mass. There is no tenderness.  Neurological: She is alert and oriented to person, place, and time.  Skin: Skin is warm and dry.  Thickened, hyperpigmented skin on the dorsum of both hands. Few areas of cracking. No significant erythema. Slight dry, hyperpigmented areas at the lateral dorsal elbows.  Psychiatric: She has a normal mood and affect. Her behavior is normal.  Vitals reviewed.   Filed Vitals:   01/26/16 1131  BP: 124/82   Pulse: 85  Temp: 98.2 F (36.8 C)  TempSrc: Oral  Resp: 16  Height: 5' 4.25" (1.632 m)  Weight: 199 lb 9.6 oz (90.538 kg)  SpO2: 98%         Assessment & Plan:   Toni Williams is a 65 y.o. female Eczema - Plan: triamcinolone ointment (KENALOG) 0.1 %  -Discussed Eucerin or Lubriderm twice per day, especially after showering, triamcinolone up to twice per day, and may need stronger steroid during the winter or with flares. Ointment prescribed as she prefers this over the cream. RTC precautions if not improving or worsening.  Essential hypertension - Plan: hydrochlorothiazide (HYDRODIURIL) 25 MG tablet, Basic metabolic panel  -Stable. Continue HCTZ 25 mg daily, BMP pending.  Meds ordered this encounter  Medications  . hydrochlorothiazide (HYDRODIURIL) 25 MG tablet    Sig: TAKE 1 TABLET (25 MG TOTAL) BY MOUTH DAILY.    Dispense:  90 tablet    Refill:  1  . triamcinolone ointment (KENALOG) 0.1 %    Sig: Apply 1 application topically 2 (two) times daily as needed.    Dispense:  80 g    Refill:  3   Patient Instructions       IF you received an x-ray today, you will receive an invoice from [facility]. Please contact [facility] at [phone] with questions or concerns regarding your invoice.   IF you received labwork today, you will receive an invoice from [employer]. Please contact [facility] at [phone] with questions or concerns regarding your invoice.   Our billing staff will not be able to assist you with questions regarding bills from these companies.  You will be contacted with the lab results as soon as they are available. The fastest way to get your results is to activate your My Chart account. Instructions are located on the last page of this paperwork. If you have not heard from [place] regarding the results in 2 weeks, please contact this office.     Kenalog up to twice per day for itching. Eucerin or Lubriderm  throughout the day - especially after bathing.   Same dose of blood pressure med for now - recheck in next 6 months for physical.   Return to the clinic or go to the nearest emergency room if any of your symptoms worsen or new symptoms occur.     I personally performed the services described in this documentation, which was scribed in my presence. The recorded information has been reviewed and considered, and addended by me as needed.

## 2016-04-12 ENCOUNTER — Ambulatory Visit (INDEPENDENT_AMBULATORY_CARE_PROVIDER_SITE_OTHER): Payer: Medicare Other | Admitting: Family Medicine

## 2016-04-12 VITALS — BP 134/74 | HR 109 | Temp 98.2°F | Resp 16 | Ht 63.5 in | Wt 198.8 lb

## 2016-04-12 DIAGNOSIS — L309 Dermatitis, unspecified: Secondary | ICD-10-CM

## 2016-04-12 MED ORDER — METHYLPREDNISOLONE ACETATE 80 MG/ML IJ SUSP
120.0000 mg | Freq: Once | INTRAMUSCULAR | Status: AC
  Administered 2016-04-12: 120 mg via INTRAMUSCULAR

## 2016-04-12 MED ORDER — FLUOCINONIDE 0.05 % EX OINT: 1.0000 "application " | TOPICAL_OINTMENT | Freq: Two times a day (BID) | CUTANEOUS | Status: DC

## 2016-04-12 MED ORDER — PREDNISONE 20 MG PO TABS: 20.0000 mg | ORAL_TABLET | Freq: Every day | ORAL | Status: DC

## 2016-04-12 NOTE — Patient Instructions (Addendum)
  Let me know if the blood pressure rises above 140/90 on three separate occasions as you stop the HCTZ.   IF you received an x-ray today, you will receive an invoice from [facility]. Please contact [facility] at [phone] with questions or concerns regarding your invoice.   IF you received labwork today, you will receive an invoice from [employer]. Please contact [facility] at [phone] with questions or concerns regarding your invoice.   Our billing staff will not be able to assist you with questions regarding bills from these companies.  You will be contacted with the lab results as soon as they are available. The fastest way to get your results is to activate your My Chart account. Instructions are located on the last page of this paperwork. If you have not heard from [place] regarding the results in 2 weeks, please contact this office.

## 2016-04-12 NOTE — Progress Notes (Signed)
Horrible eczema everywhere...cannot stop itching The prednisone only worked for a few days.    Objective:  BP 134/74 mmHg  Pulse 109  Temp(Src) 98.2 F (36.8 C) (Oral)  Resp 16  Ht 5' 3.5" (1.613 m)  Wt 198 lb 12.8 oz (90.175 kg)  BMI 34.66 kg/m2  SpO2 98% This SmartLink has not been configured with any valid records.    Wt Readings from Last 3 Encounters:  04/12/16 198 lb 12.8 oz (90.175 kg)  01/26/16 199 lb 9.6 oz (90.538 kg)  10/08/15 200 lb 3.2 oz (99991111 kg)   Marked lichenified skin on neck, hands, arms breasts.  There is some erythema on the cheeks.  Assessment:  Could this be a response to the radiation for the cancer in the past?  Could the HCTZ medicaton be aggravating the skin?  Plan:  Stop HCTZ Eczema - Plan: methylPREDNISolone acetate (DEPO-MEDROL) injection 120 mg, fluocinonide ointment (LIDEX) 0.05 %, predniSONE (DELTASONE) 20 MG tablet  Robyn Haber, MD

## 2016-07-26 ENCOUNTER — Ambulatory Visit: Payer: Self-pay | Admitting: Family Medicine

## 2016-07-27 ENCOUNTER — Ambulatory Visit: Payer: Self-pay | Admitting: Family Medicine

## 2016-08-17 ENCOUNTER — Other Ambulatory Visit: Payer: Self-pay | Admitting: Family Medicine

## 2016-08-17 DIAGNOSIS — I10 Essential (primary) hypertension: Secondary | ICD-10-CM

## 2016-10-04 ENCOUNTER — Ambulatory Visit (INDEPENDENT_AMBULATORY_CARE_PROVIDER_SITE_OTHER): Payer: Medicare Other | Admitting: Family Medicine

## 2016-10-04 VITALS — BP 122/84 | HR 90 | Temp 98.6°F | Resp 17 | Ht 63.5 in | Wt 201.0 lb

## 2016-10-04 DIAGNOSIS — T7840XA Allergy, unspecified, initial encounter: Secondary | ICD-10-CM | POA: Diagnosis not present

## 2016-10-04 MED ORDER — FLUTICASONE PROPIONATE 50 MCG/ACT NA SUSP: 2.0000 | Freq: Every day | NASAL | 11 refills | Status: DC

## 2016-10-04 MED ORDER — RANITIDINE HCL 150 MG PO TABS
150.0000 mg | ORAL_TABLET | Freq: Once | ORAL | Status: AC
  Administered 2016-10-04: 150 mg via ORAL

## 2016-10-04 MED ORDER — METHYLPREDNISOLONE SODIUM SUCC 125 MG IJ SOLR
125.0000 mg | Freq: Once | INTRAMUSCULAR | Status: AC
  Administered 2016-10-04: 125 mg via INTRAMUSCULAR

## 2016-10-04 MED ORDER — PREDNISONE 50 MG PO TABS: ORAL_TABLET | ORAL | 0 refills | Status: DC

## 2016-10-04 MED ORDER — CETIRIZINE HCL 10 MG PO TABS
10.0000 mg | ORAL_TABLET | Freq: Once | ORAL | Status: AC
  Administered 2016-10-04: 10 mg via ORAL

## 2016-10-04 MED ORDER — AZELASTINE HCL 0.1 % NA SOLN: 1.0000 | Freq: Two times a day (BID) | NASAL | 11 refills | Status: DC

## 2016-10-04 NOTE — Patient Instructions (Addendum)
You have had an allergic reaction around you lips.  It is most likely this was from shellfish.    Take the prednisone 50 mg 1 pill a day for the next 5 days.    We did a steroid shot, cetirizine/zyrtec pill, and ranitidine pill here.    Take the cetirizine 10 mg nce daily for the next several days until the swelling gets better.   You should also use vaseline or lip balm to help with the cracking.  This will provide some relief.    This should calm everything down.  If you're not feeling or getting better in the next day or so, or you start having any trouble breathing or the swelling is getting worse, come back immediately or go to the ED.      IF you received an x-ray today, you will receive an invoice from [facility]. Please contact [facility] at [phone] with questions or concerns regarding your invoice.   IF you received labwork today, you will receive an invoice from [employer]. Please contact [facility] at [phone] with questions or concerns regarding your invoice.   Our billing staff will not be able to assist you with questions regarding bills from these companies.  You will be contacted with the lab results as soon as they are available. The fastest way to get your results is to activate your My Chart account. Instructions are located on the last page of this paperwork. If you have not heard from [place] regarding the results in 2 weeks, please contact this office.

## 2016-10-04 NOTE — Progress Notes (Signed)
Toni Williams is a 65 y.o. female who presents to [facility] today for allergic reaction:  1.  Allergic reaction: Patient went to [employer] 2 days ago. She states she was not able to order her usual flounder and instead ordered some selfish. Later that evening she started feeling some tingling around her lips. The next day which was yesterday she noted swelling in her lips. This continued throughout the day 2 today. She feels the swelling is getting slightly worse. She has tried calamine lotion because of her lips being chapped from the swelling. She hasn't taken any Benadryl because she states it makes her "loopy."   Hasn't tried anything else for relief.    ROS as above.    PMH reviewed. Patient is a nonsmoker.   Past Medical History:  Diagnosis Date  . Anxiety    no meds  . Blood transfusion 1979   with missed abortion in [facility]  . Cancer ([place])   . Hypertension   . Missed abortion    x 1  . Seasonal allergies   . Termination of pregnancy    x 1   Past Surgical History:  Procedure Laterality Date  . BREAST SURGERY  10/03/2007, 11/06/2007   Left breast excision, re-excision Left breast  . BREAST SURGERY     No IV sticks/Blood Draws/Blood Pressure Left Arm,  . CESAREAN SECTION    . CHOLECYSTECTOMY    . colonscopy    . DILATION AND CURETTAGE OF UTERUS    . HYSTEROSCOPY W/D&C  10/27/2011   Procedure: DILATATION AND CURETTAGE /HYSTEROSCOPY;  Surgeon: Delice Lesch, MD;  Location: [facility];  Service: Gynecology;  Laterality: N/A;  . WISDOM TOOTH EXTRACTION      Medications reviewed. Current Outpatient Prescriptions  Medication Sig Dispense Refill  . azelastine (ASTELIN) 0.1 % nasal spray Place 1 spray into both nostrils 2 (two) times daily. Use in each nostril as directed 30 mL 11  . cholecalciferol (VITAMIN D) 1000 UNITS tablet Take 2,000 Units by mouth daily.      . fluocinonide ointment (LIDEX) [id number] % Apply 1 application topically  2 (two) times daily. 60 g 5  . fluticasone (FLONASE) 50 MCG/ACT nasal spray Place 2 sprays into both nostrils daily. 16 g 11  . hydrochlorothiazide (HYDRODIURIL) 25 MG tablet TAKE 1 TABLET (25 MG TOTAL) BY MOUTH DAILY. 90 tablet 0  . methotrexate 2.5 MG tablet Take by mouth 3 (three) times a week.     No current facility-administered medications for this visit.      Physical Exam:  BP 122/84 (BP Location: Right Arm, Patient Position: Sitting, Cuff Size: Large)   Pulse 90   Temp 98.6 F (37 C) (Oral)   Resp 17   Ht 5' 3.5" (1.613 m)   Wt 201 lb (91.2 kg)   SpO2 97%   BMI 35.05 kg/m  Gen:  Patient sitting on exam table, appears stated age in no acute distress Head: Normocephalic atraumatic Eyes: EOMI, PERRL, sclera and conjunctiva non-erythematous  Mouth: Edematous lips both upper and lower.  Some cracking along lower lips.  Nonerythematous. Rest of oropharynx is clear. Nonerythematous posterior oropharynx. No edema of tongue or Neck: No cervical lymphadenopathy noted Heart:  RRR, no murmurs auscultated. Pulm:  Clear to auscultation bilaterally with good air movement.  No wheezes or rales noted.   Skin:  No skin lesions or rash  Assessment and Plan:  1.  Angioedema: - allergic reaction  to shellfish, most likely.  I have added this to her allergy list.  - Treated here with cetirizine, zantac, and solumedrol. - Rechecked about 15 minutes afterwards.  Started to feel a little better.   - Treated with prednisone and cetirizine at home.  See AVS.   - no red flags, no evidence of breathing difficulties.  No evidence of oropharynx involvement.

## 2016-10-05 DIAGNOSIS — Z79899 Other long term (current) drug therapy: Secondary | ICD-10-CM | POA: Diagnosis not present

## 2016-10-05 DIAGNOSIS — L2084 Intrinsic (allergic) eczema: Secondary | ICD-10-CM | POA: Diagnosis not present

## 2017-01-12 DIAGNOSIS — Z1231 Encounter for screening mammogram for malignant neoplasm of breast: Secondary | ICD-10-CM | POA: Diagnosis not present

## 2017-01-12 DIAGNOSIS — Z853 Personal history of malignant neoplasm of breast: Secondary | ICD-10-CM | POA: Diagnosis not present

## 2017-01-22 ENCOUNTER — Encounter: Payer: Self-pay | Admitting: Family Medicine

## 2017-02-09 ENCOUNTER — Ambulatory Visit (INDEPENDENT_AMBULATORY_CARE_PROVIDER_SITE_OTHER): Payer: Medicare Other | Admitting: Family Medicine

## 2017-02-09 VITALS — BP 119/78 | HR 87 | Temp 98.1°F | Resp 17 | Ht 64.5 in | Wt 205.0 lb

## 2017-02-09 DIAGNOSIS — L209 Atopic dermatitis, unspecified: Secondary | ICD-10-CM

## 2017-02-09 DIAGNOSIS — R21 Rash and other nonspecific skin eruption: Secondary | ICD-10-CM | POA: Diagnosis not present

## 2017-02-09 DIAGNOSIS — L259 Unspecified contact dermatitis, unspecified cause: Secondary | ICD-10-CM

## 2017-02-09 MED ORDER — PREDNISONE 20 MG PO TABS: ORAL_TABLET | ORAL | 0 refills | Status: DC

## 2017-02-09 NOTE — Patient Instructions (Addendum)
Your rash may be a combination of eczema as well as contact dermatitis. Start prednisone today, okay to continue the hydrocortisone cream to the face that was prescribed by your dermatologist. Lidex steroid cream to the arms or hands if needed. Once prednisone starts to work, you may not need as much of that steroid cream.   Eucerin or Lubriderm lotion over-the-counter if needed, and keep follow-up with dermatologist next week. If further spread or worsening of the rash prior to that visit, return for recheck here if needed.   Contact Dermatitis Dermatitis is redness, soreness, and swelling (inflammation) of the skin. Contact dermatitis is a reaction to certain substances that touch the skin. There are two types of contact dermatitis:  Irritant contact dermatitis. This type is caused by something that irritates your skin, such as dry hands from washing them too much. This type does not require previous exposure to the substance for a reaction to occur. This type is more common.  Allergic contact dermatitis. This type is caused by a substance that you are allergic to, such as a nickel allergy or poison ivy. This type only occurs if you have been exposed to the substance (allergen) before. Upon a repeat exposure, your body reacts to the substance. This type is less common. What are the causes? Many different substances can cause contact dermatitis. Irritant contact dermatitis is most commonly caused by exposure to:  Makeup.  Soaps.  Detergents.  Bleaches.  Acids.  Metal salts, such as nickel. Allergic contact dermatitis is most commonly caused by exposure to:  Poisonous plants.  Chemicals.  Jewelry.  Latex.  Medicines.  Preservatives in products, such as clothing. What increases the risk? This condition is more likely to develop in:  People who have jobs that expose them to irritants or allergens.  People who have certain medical conditions, such as asthma or eczema. What  are the signs or symptoms? Symptoms of this condition may occur anywhere on your body where the irritant has touched you or is touched by you. Symptoms include:  Dryness or flaking.  Redness.  Cracks.  Itching.  Pain or a burning feeling.  Blisters.  Drainage of small amounts of blood or clear fluid from skin cracks. With allergic contact dermatitis, there may also be swelling in areas such as the eyelids, mouth, or genitals. How is this diagnosed? This condition is diagnosed with a medical history and physical exam. A patch skin test may be performed to help determine the cause. If the condition is related to your job, you may need to see an occupational medicine specialist. How is this treated? Treatment for this condition includes figuring out what caused the reaction and protecting your skin from further contact. Treatment may also include:  Steroid creams or ointments. Oral steroid medicines may be needed in more severe cases.  Antibiotics or antibacterial ointments, if a skin infection is present.  Antihistamine lotion or an antihistamine taken by mouth to ease itching.  A bandage (dressing). Follow these instructions at home: [place] your skin as needed.  Apply cool compresses to the affected areas.  Try taking a bath with:  Epsom salts. Follow the instructions on the packaging. You can get these at your local pharmacy or grocery store.  Baking soda. Pour a small amount into the bath as directed by your health care provider.  Colloidal oatmeal. Follow the instructions on the packaging. You can get this at your local pharmacy or grocery store.  Try applying baking  soda paste to your skin. Stir water into baking soda until it reaches a paste-like consistency.  Do not scratch your skin.  Bathe less frequently, such as every other day.  Bathe in lukewarm water. Avoid using hot water. Medicines   Take or apply over-the-counter and prescription  medicines only as told by your health care provider.  If you were prescribed an antibiotic medicine, take or apply your antibiotic as told by your health care provider. Do not stop using the antibiotic even if your condition starts to improve. General instructions   Keep all follow-up visits as told by your health care provider. This is important.  Avoid the substance that caused your reaction. If you do not know what caused it, keep a journal to try to track what caused it. Write down:  What you eat.  What cosmetic products you use.  What you drink.  What you wear in the affected area. This includes jewelry.  If you were given a dressing, take care of it as told by your health care provider. This includes when to change and remove it. Contact a health care provider if:  Your condition does not improve with treatment.  Your condition gets worse.  You have signs of infection such as swelling, tenderness, redness, soreness, or warmth in the affected area.  You have a fever.  You have new symptoms. Get help right away if:  You have a severe headache, neck pain, or neck stiffness.  You vomit.  You feel very sleepy.  You notice red streaks coming from the affected area.  Your bone or joint underneath the affected area becomes painful after the skin has healed.  The affected area turns darker.  You have difficulty breathing. This information is not intended to replace advice given to you by your health care provider. Make sure you discuss any questions you have with your health care provider. Document Released: 10/13/2000 Document Revised: 03/23/2016 Document Reviewed: 03/03/2015 Elsevier Interactive Patient Education  2017 [employer].   Eczema Eczema, also called atopic dermatitis, is a skin disorder that causes inflammation of the skin. It causes a red rash and dry, scaly skin. The skin becomes very itchy. Eczema is generally worse during the cooler winter months and  often improves with the warmth of summer. Eczema usually starts showing signs in infancy. Some children outgrow eczema, but it may last through adulthood. What are the causes? The exact cause of eczema is not known, but it appears to run in families. People with eczema often have a family history of eczema, allergies, asthma, or hay fever. Eczema is not contagious. Flare-ups of the condition may be caused by:  Contact with something you are sensitive or allergic to.  Stress. What are the signs or symptoms?  Dry, scaly skin.  Red, itchy rash.  Itchiness. This may occur before the skin rash and may be very intense. How is this diagnosed? The diagnosis of eczema is usually made based on symptoms and medical history. How is this treated? Eczema cannot be cured, but symptoms usually can be controlled with treatment and other strategies. A treatment plan might include:  Controlling the itching and scratching.  Use over-the-counter antihistamines as directed for itching. This is especially useful at night when the itching tends to be worse.  Use over-the-counter steroid creams as directed for itching.  Avoid scratching. Scratching makes the rash and itching worse. It may also result in a skin infection (impetigo) due to a break in the skin caused  by scratching.  Keeping the skin well moisturized with creams every day. This will seal in moisture and help prevent dryness. Lotions that contain alcohol and water should be avoided because they can dry the skin.  Limiting exposure to things that you are sensitive or allergic to (allergens).  Recognizing situations that cause stress.  Developing a plan to manage stress. Follow these instructions at home:  Only take over-the-counter or prescription medicines as directed by your health care provider.  Do not use anything on the skin without checking with your health care provider.  Keep baths or showers short (5 minutes) in warm (not hot)  water. Use mild cleansers for bathing. These should be unscented. You may add nonperfumed bath oil to the bath water. It is best to avoid soap and bubble bath.  Immediately after a bath or shower, when the skin is still damp, apply a moisturizing ointment to the entire body. This ointment should be a petroleum ointment. This will seal in moisture and help prevent dryness. The thicker the ointment, the better. These should be unscented.  Keep fingernails cut short. Children with eczema may need to wear soft gloves or mittens at night after applying an ointment.  Dress in clothes made of cotton or cotton blends. Dress lightly, because heat increases itching.  A child with eczema should stay away from anyone with fever blisters or cold sores. The virus that causes fever blisters (herpes simplex) can cause a serious skin infection in children with eczema. Contact a health care provider if:  Your itching interferes with sleep.  Your rash gets worse or is not better within 1 week after starting treatment.  You see pus or soft yellow scabs in the rash area.  You have a fever.  You have a rash flare-up after contact with someone who has fever blisters. This information is not intended to replace advice given to you by your health care provider. Make sure you discuss any questions you have with your health care provider. Document Released: 10/13/2000 Document Revised: 03/23/2016 Document Reviewed: 05/19/2013 Elsevier Interactive Patient Education  2017 [employer].   IF you received an x-ray today, you will receive an invoice from [facility]. Please contact [facility] at [phone] with questions or concerns regarding your invoice.   IF you received labwork today, you will receive an invoice from [facility]. Please contact [facility] at [phone] with questions or concerns regarding your invoice.   Our billing staff will not be able to assist you with questions regarding  bills from these companies.  You will be contacted with the lab results as soon as they are available. The fastest way to get your results is to activate your My Chart account. Instructions are located on the last page of this paperwork. If you have not heard from [place] regarding the results in 2 weeks, please contact this office.

## 2017-02-09 NOTE — Progress Notes (Signed)
Subjective:  By signing my name below, I, Essence Howell, attest that this documentation has been prepared under the direction and in the presence of Wendie Agreste, MD Electronically Signed: Ladene Artist, ED Scribe 02/09/2017 at 5:34 PM.   Patient ID: Toni Williams, female    DOB: [birth year], 66 y.o.   MRN: [id number]  Chief Complaint  Patient presents with  . Eczema    outbreak    HPI Toni Williams is a 66 y.o. female who presents to Primary Care at [facility]. H/o Lidex ointment in the past. Last prescribed June 2017. At that time she had areas on her neck, hands, arms and breast, thought to be a possible response to radiation from breast CA versus irritation from HCTZ. Given injection of DepoMedrol, Prednisone by mouth and Lidex ointment.   Today, pt reports an eczema outbreak to her hands, neck and face first noticed 1 week ago. She reports trying a new eczema calming soap a little over a week ago which she suspects exacerbated her rash. She has tried hydrocortisone 2.5 on her face and clobetasol cream on her hands and neck. Pt denies rash in the genitalia, sob and wheezing. She has an upcoming appointment on 4/19 with her dermatologist Dr. Leonie Green with [facility]. No h/o DM.  Patient Active Problem List   Diagnosis Date Noted  . Chest pain 05/17/2012  . Abnormal resting ECG findings 04/24/2012  . Vitamin D deficiency 04/24/2012  . Hypertension 02/11/2012  . Breast cancer ([place]) 02/11/2012  . Eczema 02/11/2012  . Allergic rhinitis due to allergen 02/11/2012   Past Medical History:  Diagnosis Date  . Anxiety    no meds  . Blood transfusion 1979   with missed abortion in [place]  . Cancer ([place])   . Hypertension   . Missed abortion    x 1  . Seasonal allergies   . Termination of pregnancy    x 1   Past Surgical History:  Procedure Laterality Date  . BREAST SURGERY  10/03/2007, 11/06/2007   Left breast excision, re-excision Left breast  .  BREAST SURGERY     No IV sticks/Blood Draws/Blood Pressure Left Arm,  . CESAREAN SECTION    . CHOLECYSTECTOMY    . colonscopy    . DILATION AND CURETTAGE OF UTERUS    . HYSTEROSCOPY W/D&C  10/27/2011   Procedure: DILATATION AND CURETTAGE /HYSTEROSCOPY;  Surgeon: Delice Lesch, MD;  Location: [facility];  Service: Gynecology;  Laterality: N/A;  . WISDOM TOOTH EXTRACTION     Allergies  Allergen Reactions  . Morphine And Related Other (See Comments)    "psychotic" reaction per pt report  . Shellfish Allergy Swelling  . Latex Rash    itching   Prior to Admission medications   Medication Sig Start Date End Date Taking? Authorizing Provider  azelastine (ASTELIN) 0.1 % nasal spray Place 1 spray into both nostrils 2 (two) times daily. Use in each nostril as directed 10/04/16  Yes Alveda Reasons, MD  cholecalciferol (VITAMIN D) 1000 UNITS tablet Take 2,000 Units by mouth daily.     Yes Historical Provider, MD  fluocinonide ointment (LIDEX) 9.98 % Apply 1 application topically 2 (two) times daily. 04/12/16  Yes Robyn Haber, MD  fluticasone [facility]) 50 MCG/ACT nasal spray Place 2 sprays into both nostrils daily. 10/04/16  Yes Alveda Reasons, MD  hydrochlorothiazide (HYDRODIURIL) 25 MG tablet TAKE 1 TABLET (25 MG TOTAL) BY MOUTH DAILY. 08/20/16  Yes Dellis Filbert  Valora Piccolo, MD  methotrexate 2.5 MG tablet Take by mouth 3 (three) times a week.   Yes Historical Provider, MD  predniSONE (DELTASONE) 50 MG tablet Take 1 tab po daily x 5 days 10/04/16  Yes Alveda Reasons, MD   Social History   Social History  . Marital status: Married    Spouse name: N/A  . Number of children: N/A  . Years of education: N/A   Occupational History  . Not on file.   Social History Main Topics  . Smoking status: Former Smoker    Packs/day: 0.25    Years: 2.00    Types: Cigarettes    Quit date: 10/30/1976  . Smokeless tobacco: Never Used  . Alcohol use No  . Drug use: No  . Sexual activity: Not Currently     Birth control/ protection: None   Other Topics Concern  . Not on file   Social History Narrative  . No narrative on file   Review of Systems  Respiratory: Negative for shortness of breath and wheezing.   Skin: Positive for rash.      Objective:   Physical Exam  Constitutional: She is oriented to person, place, and time. She appears well-developed and well-nourished. No distress.  HENT:  Head: Normocephalic and atraumatic.  Mouth/Throat: Oropharynx is clear and moist and mucous membranes are normal. No oral lesions.  Eyes: Conjunctivae and EOM are normal.  Neck: Neck supple. No tracheal deviation present.  Cardiovascular: Normal rate, regular rhythm and normal heart sounds.   Pulmonary/Chest: Effort normal and breath sounds normal. No respiratory distress.  Musculoskeletal: Normal range of motion.  Neurological: She is alert and oriented to person, place, and time.  Skin: Skin is warm and dry. Rash noted.  Hyperpigmented slightly scaly rash primarily on the R greater than L neck and upper chest. A few satellite lesions off to the side. Minimal rash in posterior neck. Same rash spreads to chin, perioral area, bilateral face. Periorbital areas and forehead are spared. Similar rash spreading to external ears bilaterally. Few patches involving occipital scalp. Slight dry skin behind the knees but otherwise LE appear normal. Few dry excoriated areas R and L forearms. Diffuse hyperpigmented slight scaled area of dorsum bilateral hand and into the volar fingers. 2nd-5th on R, 2nd-3rd on L.  Psychiatric: She has a normal mood and affect. Her behavior is normal.  Nursing note and vitals reviewed.  Vitals:   02/09/17 1712  BP: 119/78  Pulse: 87  Resp: 17  Temp: 98.1 F (36.7 C)  TempSrc: Oral  SpO2: 97%  Weight: 205 lb (93 kg)  Height: 5' 4.5" (1.638 m)      Assessment & Plan:    Toni Williams is a 66 y.o. female Rash and nonspecific skin eruption - Plan: predniSONE  (DELTASONE) 20 MG tablet  Atopic dermatitis, unspecified type - Plan: predniSONE (DELTASONE) 20 MG tablet  Contact dermatitis, unspecified contact dermatitis type, unspecified trigger - Plan: predniSONE (DELTASONE) 20 MG tablet  Based on locations and acute increased rash, suspect some component of contact dermatitis with underlying atopic dermatitis. Unknown source of reaction/contact dermatitis but possible over-the-counter eczema treatment.  -based on location and multiple areas involved with acute worsening will start prednisone taper. Side effects discussed  -Okay to continue the Lidex steroid cream to the arms/hands, hydrocortisone cream to the face that has been prescribed by dermatology.  -Continue follow-up with dermatology as planned. RTC precautions if continued worsening  Meds ordered this encounter  Medications  .  predniSONE (DELTASONE) 20 MG tablet    Sig: 3 tabs by mouth each day for 2 days, 2 tabs by mouth each day for 2 days, 1 tab by mouth each day for 2 days, 1/2 tab by mouth each day for 2 days.    Dispense:  13 tablet    Refill:  0   Patient Instructions    Your rash may be a combination of eczema as well as contact dermatitis. Start prednisone today, okay to continue the hydrocortisone cream to the face that was prescribed by your dermatologist. Lidex steroid cream to the arms or hands if needed. Once prednisone starts to work, you may not need as much of that steroid cream.   Eucerin or Lubriderm lotion over-the-counter if needed, and keep follow-up with dermatologist next week. If further spread or worsening of the rash prior to that visit, return for recheck here if needed.   Contact Dermatitis Dermatitis is redness, soreness, and swelling (inflammation) of the skin. Contact dermatitis is a reaction to certain substances that touch the skin. There are two types of contact dermatitis:  Irritant contact dermatitis. This type is caused by something that irritates your  skin, such as dry hands from washing them too much. This type does not require previous exposure to the substance for a reaction to occur. This type is more common.  Allergic contact dermatitis. This type is caused by a substance that you are allergic to, such as a nickel allergy or poison ivy. This type only occurs if you have been exposed to the substance (allergen) before. Upon a repeat exposure, your body reacts to the substance. This type is less common. What are the causes? Many different substances can cause contact dermatitis. Irritant contact dermatitis is most commonly caused by exposure to:  Makeup.  Soaps.  Detergents.  Bleaches.  Acids.  Metal salts, such as nickel. Allergic contact dermatitis is most commonly caused by exposure to:  Poisonous plants.  Chemicals.  Jewelry.  Latex.  Medicines.  Preservatives in products, such as clothing. What increases the risk? This condition is more likely to develop in:  People who have jobs that expose them to irritants or allergens.  People who have certain medical conditions, such as asthma or eczema. What are the signs or symptoms? Symptoms of this condition may occur anywhere on your body where the irritant has touched you or is touched by you. Symptoms include:  Dryness or flaking.  Redness.  Cracks.  Itching.  Pain or a burning feeling.  Blisters.  Drainage of small amounts of blood or clear fluid from skin cracks. With allergic contact dermatitis, there may also be swelling in areas such as the eyelids, mouth, or genitals. How is this diagnosed? This condition is diagnosed with a medical history and physical exam. A patch skin test may be performed to help determine the cause. If the condition is related to your job, you may need to see an occupational medicine specialist. How is this treated? Treatment for this condition includes figuring out what caused the reaction and protecting your skin from  further contact. Treatment may also include:  Steroid creams or ointments. Oral steroid medicines may be needed in more severe cases.  Antibiotics or antibacterial ointments, if a skin infection is present.  Antihistamine lotion or an antihistamine taken by mouth to ease itching.  A bandage (dressing). Follow these instructions at home: [place] your skin as needed.  Apply cool compresses to the affected areas.  Try taking a bath with:  Epsom salts. Follow the instructions on the packaging. You can get these at your local pharmacy or grocery store.  Baking soda. Pour a small amount into the bath as directed by your health care provider.  Colloidal oatmeal. Follow the instructions on the packaging. You can get this at your local pharmacy or grocery store.  Try applying baking soda paste to your skin. Stir water into baking soda until it reaches a paste-like consistency.  Do not scratch your skin.  Bathe less frequently, such as every other day.  Bathe in lukewarm water. Avoid using hot water. Medicines   Take or apply over-the-counter and prescription medicines only as told by your health care provider.  If you were prescribed an antibiotic medicine, take or apply your antibiotic as told by your health care provider. Do not stop using the antibiotic even if your condition starts to improve. General instructions   Keep all follow-up visits as told by your health care provider. This is important.  Avoid the substance that caused your reaction. If you do not know what caused it, keep a journal to try to track what caused it. Write down:  What you eat.  What cosmetic products you use.  What you drink.  What you wear in the affected area. This includes jewelry.  If you were given a dressing, take care of it as told by your health care provider. This includes when to change and remove it. Contact a health care provider if:  Your condition does not improve  with treatment.  Your condition gets worse.  You have signs of infection such as swelling, tenderness, redness, soreness, or warmth in the affected area.  You have a fever.  You have new symptoms. Get help right away if:  You have a severe headache, neck pain, or neck stiffness.  You vomit.  You feel very sleepy.  You notice red streaks coming from the affected area.  Your bone or joint underneath the affected area becomes painful after the skin has healed.  The affected area turns darker.  You have difficulty breathing. This information is not intended to replace advice given to you by your health care provider. Make sure you discuss any questions you have with your health care provider. Document Released: 10/13/2000 Document Revised: 03/23/2016 Document Reviewed: 03/03/2015 Elsevier Interactive Patient Education  2017 [employer].   Eczema Eczema, also called atopic dermatitis, is a skin disorder that causes inflammation of the skin. It causes a red rash and dry, scaly skin. The skin becomes very itchy. Eczema is generally worse during the cooler winter months and often improves with the warmth of summer. Eczema usually starts showing signs in infancy. Some children outgrow eczema, but it may last through adulthood. What are the causes? The exact cause of eczema is not known, but it appears to run in families. People with eczema often have a family history of eczema, allergies, asthma, or hay fever. Eczema is not contagious. Flare-ups of the condition may be caused by:  Contact with something you are sensitive or allergic to.  Stress. What are the signs or symptoms?  Dry, scaly skin.  Red, itchy rash.  Itchiness. This may occur before the skin rash and may be very intense. How is this diagnosed? The diagnosis of eczema is usually made based on symptoms and medical history. How is this treated? Eczema cannot be cured, but symptoms usually can be controlled with  treatment and other strategies. A treatment  plan might include:  Controlling the itching and scratching.  Use over-the-counter antihistamines as directed for itching. This is especially useful at night when the itching tends to be worse.  Use over-the-counter steroid creams as directed for itching.  Avoid scratching. Scratching makes the rash and itching worse. It may also result in a skin infection (impetigo) due to a break in the skin caused by scratching.  Keeping the skin well moisturized with creams every day. This will seal in moisture and help prevent dryness. Lotions that contain alcohol and water should be avoided because they can dry the skin.  Limiting exposure to things that you are sensitive or allergic to (allergens).  Recognizing situations that cause stress.  Developing a plan to manage stress. Follow these instructions at home:  Only take over-the-counter or prescription medicines as directed by your health care provider.  Do not use anything on the skin without checking with your health care provider.  Keep baths or showers short (5 minutes) in warm (not hot) water. Use mild cleansers for bathing. These should be unscented. You may add nonperfumed bath oil to the bath water. It is best to avoid soap and bubble bath.  Immediately after a bath or shower, when the skin is still damp, apply a moisturizing ointment to the entire body. This ointment should be a petroleum ointment. This will seal in moisture and help prevent dryness. The thicker the ointment, the better. These should be unscented.  Keep fingernails cut short. Children with eczema may need to wear soft gloves or mittens at night after applying an ointment.  Dress in clothes made of cotton or cotton blends. Dress lightly, because heat increases itching.  A child with eczema should stay away from anyone with fever blisters or cold sores. The virus that causes fever blisters (herpes simplex) can cause a serious  skin infection in children with eczema. Contact a health care provider if:  Your itching interferes with sleep.  Your rash gets worse or is not better within 1 week after starting treatment.  You see pus or soft yellow scabs in the rash area.  You have a fever.  You have a rash flare-up after contact with someone who has fever blisters. This information is not intended to replace advice given to you by your health care provider. Make sure you discuss any questions you have with your health care provider. Document Released: 10/13/2000 Document Revised: 03/23/2016 Document Reviewed: 05/19/2013 Elsevier Interactive Patient Education  2017 [employer].   IF you received an x-ray today, you will receive an invoice from [facility]. Please contact [facility] at [phone] with questions or concerns regarding your invoice.   IF you received labwork today, you will receive an invoice from [facility]. Please contact [facility] at [phone] with questions or concerns regarding your invoice.   Our billing staff will not be able to assist you with questions regarding bills from these companies.  You will be contacted with the lab results as soon as they are available. The fastest way to get your results is to activate your My Chart account. Instructions are located on the last page of this paperwork. If you have not heard from [place] regarding the results in 2 weeks, please contact this office.       I personally performed the services described in this documentation, which was scribed in my presence. The recorded information has been reviewed and considered for accuracy and completeness, addended by me as needed, and agree with information above.  Signed,   Merri Ray, MD [facility] at [facility].  02/11/17 9:27 PM

## 2017-02-10 ENCOUNTER — Ambulatory Visit: Payer: Medicare Other

## 2017-02-15 DIAGNOSIS — Z79899 Other long term (current) drug therapy: Secondary | ICD-10-CM | POA: Diagnosis not present

## 2017-02-15 DIAGNOSIS — L2084 Intrinsic (allergic) eczema: Secondary | ICD-10-CM | POA: Diagnosis not present

## 2017-03-21 ENCOUNTER — Other Ambulatory Visit: Payer: Self-pay | Admitting: Family Medicine

## 2017-03-21 DIAGNOSIS — I10 Essential (primary) hypertension: Secondary | ICD-10-CM

## 2017-06-14 DIAGNOSIS — L2084 Intrinsic (allergic) eczema: Secondary | ICD-10-CM | POA: Diagnosis not present

## 2017-06-14 DIAGNOSIS — Z79899 Other long term (current) drug therapy: Secondary | ICD-10-CM | POA: Diagnosis not present

## 2017-07-04 ENCOUNTER — Other Ambulatory Visit: Payer: Self-pay | Admitting: Family Medicine

## 2017-07-04 DIAGNOSIS — I10 Essential (primary) hypertension: Secondary | ICD-10-CM

## 2017-07-10 ENCOUNTER — Encounter: Payer: Self-pay | Admitting: Family Medicine

## 2017-07-10 ENCOUNTER — Ambulatory Visit (INDEPENDENT_AMBULATORY_CARE_PROVIDER_SITE_OTHER): Payer: Medicare Other | Admitting: Family Medicine

## 2017-07-10 VITALS — BP 125/81 | HR 92 | Temp 98.3°F | Resp 16 | Ht 64.5 in | Wt 206.6 lb

## 2017-07-10 DIAGNOSIS — I10 Essential (primary) hypertension: Secondary | ICD-10-CM | POA: Diagnosis not present

## 2017-07-10 DIAGNOSIS — J309 Allergic rhinitis, unspecified: Secondary | ICD-10-CM

## 2017-07-10 MED ORDER — HYDROCHLOROTHIAZIDE 25 MG PO TABS: ORAL_TABLET | ORAL | 1 refills | Status: DC

## 2017-07-10 MED ORDER — AZELASTINE HCL 0.1 % NA SOLN: 1.0000 | Freq: Two times a day (BID) | NASAL | 11 refills | Status: DC

## 2017-07-10 NOTE — Progress Notes (Signed)
Subjective:    Patient ID: Toni Williams, female    DOB: 1951-06-02, 66 y.o.   MRN: [id number]  HPI Toni Williams is a 66 y.o. female Presents today for: Chief Complaint  Patient presents with  . Medication Refill    patient requesting refills for HCTZ and Astelin    Hypertension: She takes HCTZ 25 mg daily. BP was controlled at 119/78 was seen in April. No new side effects. Not fasting - last ate few hours.  Lab Results  Component Value Date   CREATININE 0.63 01/26/2016   Allergic rhinitis Has used Astelin nasal spray. Has also been prescribed Flonase nasal spray - uses both as needed. Using flonase alone now as ran out of Astelin.   Health maintenance breast cancer screening: history of breast cancer with left lumpectomy and radiation in 2008. Mammogram 01/12/17, stable calcifications left breast. Colonoscopy: last had about 7 years ago. Dr. Benson Norway. Normal, repeat 10 years.  Immunizations: Due for pneumonia vaccination as well as flu and tetanus. - never takes flu vaccine. Declined all vaccines today.    Patient Active Problem List   Diagnosis Date Noted  . Chest pain 05/17/2012  . Abnormal resting ECG findings 04/24/2012  . Vitamin D deficiency 04/24/2012  . Hypertension 02/11/2012  . Breast cancer ([place]) 02/11/2012  . Eczema 02/11/2012  . Allergic rhinitis due to allergen 02/11/2012   Past Medical History:  Diagnosis Date  . Anxiety    no meds  . Blood transfusion 1979   with missed abortion in [facility] -Dr He[facility]   . Cancer ([place])   . Hypertension   . Missed abortion    x 1  . Seasonal allergies   . Termination of pregnancy    x 1   Past Surgical History:  Procedure Laterality Date  . BREAST SURGERY  10/03/2007, 11/06/2007   Left breast excision, re-excision Left breast  . BREAST SURGERY     No IV sticks/Blood Draws/Blood Pressure Left Arm,  . CESAREAN SECTION    . CHOLECYSTECTOMY    . colonscopy    . DILATION AND CURETTAGE OF UTERUS    .  HYSTEROSCOPY W/D&C  10/27/2011   Procedure: DILATATION AND CURETTAGE /HYSTEROSCOPY;  Surgeon: Delice Lesch, MD;  Location: [facility];  Service: Gynecology;  Laterality: N/A;  . WISDOM TOOTH EXTRACTION     Allergies  Allergen Reactions  . Morphine And Related Other (See Comments)    "psychotic" reaction per pt report  . Shellfish Allergy Swelling  . Latex Rash    itching   Prior to Admission medications   Medication Sig Start Date End Date Taking? Authorizing Provider  azelastine (ASTELIN) 0.1 % nasal spray Place 1 spray into both nostrils 2 (two) times daily. Use in each nostril as directed 10/04/16  Yes Alveda Reasons, MD  cholecalciferol (VITAMIN D) 1000 UNITS tablet Take 2,000 Units by mouth daily.     Yes [provider]  fluocinonide ointment (LIDEX) 5.40 % Apply 1 application topically 2 (two) times daily. 04/12/16  Yes Robyn Haber, MD  fluticasone (FLONASE) 50 MCG/ACT nasal spray Place 2 sprays into both nostrils daily. 10/04/16  Yes Alveda Reasons, MD  hydrochlorothiazide (HYDRODIURIL) 25 MG tablet TAKE 1 TABLET (25 MG TOTAL) BY MOUTH DAILY. 03/22/17  Yes Wendie Agreste, MD  methotrexate 2.5 MG tablet Take by mouth 3 (three) times a week.   Yes [provider]   Social History   Social History  . Marital status: Married  Spouse name: N/A  . Number of children: N/A  . Years of education: N/A   Occupational History  . Not on file.   Social History Main Topics  . Smoking status: Former Smoker    Packs/day: 0.25    Years: 2.00    Types: Cigarettes    Quit date: 10/30/1976  . Smokeless tobacco: Never Used  . Alcohol use No  . Drug use: No  . Sexual activity: Not Currently    Birth control/ protection: None   Other Topics Concern  . Not on file   Social History Narrative  . No narrative on file    Review of Systems  Constitutional: Negative for fatigue and unexpected weight change.  Respiratory: Negative for chest tightness and  shortness of breath.   Cardiovascular: Negative for chest pain, palpitations and leg swelling.  Gastrointestinal: Negative for abdominal pain and blood in stool.  Neurological: Negative for dizziness, syncope, light-headedness and headaches.       Objective:   Physical Exam  Constitutional: She is oriented to person, place, and time. She appears well-developed and well-nourished.  HENT:  Head: Normocephalic and atraumatic.  Eyes: Pupils are equal, round, and reactive to light. Conjunctivae and EOM are normal.  Neck: Carotid bruit is not present.  Cardiovascular: Normal rate, regular rhythm, normal heart sounds and intact distal pulses.   Pulmonary/Chest: Effort normal and breath sounds normal.  Abdominal: Soft. She exhibits no pulsatile midline mass. There is no tenderness.  Neurological: She is alert and oriented to person, place, and time.  Skin: Skin is warm and dry.  Psychiatric: She has a normal mood and affect. Her behavior is normal.  Vitals reviewed.  Vitals:   07/10/17 1350  BP: 125/81  Pulse: 92  Resp: 16  Temp: 98.3 F (36.8 C)  TempSrc: Oral  SpO2: 99%  Weight: 206 lb 9.6 oz (93.7 kg)  Height: 5' 4.5" (1.638 m)       Assessment & Plan:   Toni Williams is a 66 y.o. female Essential hypertension - Plan: Basic metabolic panel, hydrochlorothiazide (HYDRODIURIL) 25 MG tablet  - Stable, continue same dose HCTZ, check BMP.  -Return for fasting lab visit physical in the next 3-6 months  Allergic rhinitis, unspecified seasonality, unspecified trigger - Plan: azelastine (ASTELIN) 0.1 % nasal spray  - Stable. Continue Astelin.  Immunization counseling- recommended vaccines above, were declined at this point. Advised to let me know if she changes her mind.  Meds ordered this encounter  Medications  . azelastine (ASTELIN) 0.1 % nasal spray    Sig: Place 1 spray into both nostrils 2 (two) times daily. Use in each nostril as directed    Dispense:  30 mL     Refill:  11  . hydrochlorothiazide (HYDRODIURIL) 25 MG tablet    Sig: TAKE 1 TABLET (25 MG TOTAL) BY MOUTH DAILY.    Dispense:  90 tablet    Refill:  1   Patient Instructions    Blood pressure looks okay today. I refilled your blood pressure medication as well as her medication for allergies.   Please follow up with [place] within the next 3-6 months for Medicare wellness visit. If you change your mind about the pneumonia vaccine, flu vaccine, tetanus vaccine, shingles vaccine, please let me know.   IF you received an x-ray today, you will receive an invoice from [facility]. Please contact [facility] at [phone] with questions or concerns regarding your invoice.   IF you received labwork today,  you will receive an invoice from [place]. Please contact [facility] at [phone] with questions or concerns regarding your invoice.   Our billing staff will not be able to assist you with questions regarding bills from these companies.  You will be contacted with the lab results as soon as they are available. The fastest way to get your results is to activate your My Chart account. Instructions are located on the last page of this paperwork. If you have not heard from [place] regarding the results in 2 weeks, please contact this office.      Signed,   Merri Ray, MD [facility] [place].  07/10/17 2:33 PM

## 2017-07-10 NOTE — Patient Instructions (Addendum)
  Blood pressure looks okay today. I refilled your blood pressure medication as well as her medication for allergies.   Please follow up with [place] within the next 3-6 months for Medicare wellness visit. If you change your mind about the pneumonia vaccine, flu vaccine, tetanus vaccine, shingles vaccine, please let me know.   IF you received an x-ray today, you will receive an invoice from [facility]. Please contact [facility] at [phone] with questions or concerns regarding your invoice.   IF you received labwork today, you will receive an invoice from [place]. Please contact [facility] at [phone] with questions or concerns regarding your invoice.   Our billing staff will not be able to assist you with questions regarding bills from these companies.  You will be contacted with the lab results as soon as they are available. The fastest way to get your results is to activate your My Chart account. Instructions are located on the last page of this paperwork. If you have not heard from [place] regarding the results in 2 weeks, please contact this office.

## 2017-07-11 LAB — BASIC METABOLIC PANEL
BUN/Creatinine Ratio: 23 (ref 12–28)
BUN: 17 mg/dL (ref 8–27)
CALCIUM: 9.8 mg/dL (ref 8.7–10.3)
CHLORIDE: 101 mmol/L (ref 96–106)
CO2: 24 mmol/L (ref 20–29)
Creatinine, Ser: 0.74 mg/dL (ref 0.57–1.00)
GFR calc Af Amer: 98 mL/min/{1.73_m2} (ref >59)
GFR calc non Af Amer: 85 mL/min/{1.73_m2} (ref >59)
Glucose: 140 mg/dL — ABNORMAL HIGH (ref 65–99)
POTASSIUM: 3.9 mmol/L (ref 3.5–5.2)
SODIUM: 141 mmol/L (ref 134–144)

## 2017-07-16 ENCOUNTER — Encounter: Payer: Self-pay | Admitting: *Deleted

## 2017-09-07 DIAGNOSIS — Z01419 Encounter for gynecological examination (general) (routine) without abnormal findings: Secondary | ICD-10-CM | POA: Diagnosis not present

## 2017-09-07 DIAGNOSIS — Z124 Encounter for screening for malignant neoplasm of cervix: Secondary | ICD-10-CM | POA: Diagnosis not present

## 2017-09-07 DIAGNOSIS — N950 Postmenopausal bleeding: Secondary | ICD-10-CM | POA: Diagnosis not present

## 2017-10-01 ENCOUNTER — Other Ambulatory Visit: Payer: Self-pay | Admitting: Obstetrics and Gynecology

## 2017-10-01 DIAGNOSIS — N950 Postmenopausal bleeding: Secondary | ICD-10-CM | POA: Diagnosis not present

## 2017-10-18 DIAGNOSIS — L2084 Intrinsic (allergic) eczema: Secondary | ICD-10-CM | POA: Diagnosis not present

## 2017-10-18 DIAGNOSIS — L678 Other hair color and hair shaft abnormalities: Secondary | ICD-10-CM | POA: Diagnosis not present

## 2017-10-18 DIAGNOSIS — Z79899 Other long term (current) drug therapy: Secondary | ICD-10-CM | POA: Diagnosis not present

## 2017-12-18 DIAGNOSIS — E559 Vitamin D deficiency, unspecified: Secondary | ICD-10-CM | POA: Diagnosis not present

## 2018-01-05 ENCOUNTER — Other Ambulatory Visit: Payer: Self-pay | Admitting: Family Medicine

## 2018-01-05 DIAGNOSIS — I10 Essential (primary) hypertension: Secondary | ICD-10-CM

## 2018-01-07 NOTE — Telephone Encounter (Signed)
LOV  07/10/2017 with Dr. Carlota Raspberry / Refill request for Hydrochlorothiazide / Refilled per protocol /

## 2018-01-22 DIAGNOSIS — R928 Other abnormal and inconclusive findings on diagnostic imaging of breast: Secondary | ICD-10-CM | POA: Diagnosis not present

## 2018-01-22 DIAGNOSIS — Z853 Personal history of malignant neoplasm of breast: Secondary | ICD-10-CM | POA: Diagnosis not present

## 2018-02-14 DIAGNOSIS — L678 Other hair color and hair shaft abnormalities: Secondary | ICD-10-CM | POA: Diagnosis not present

## 2018-02-14 DIAGNOSIS — L2084 Intrinsic (allergic) eczema: Secondary | ICD-10-CM | POA: Diagnosis not present

## 2018-02-14 DIAGNOSIS — Z79899 Other long term (current) drug therapy: Secondary | ICD-10-CM | POA: Diagnosis not present

## 2018-03-13 DIAGNOSIS — M9903 Segmental and somatic dysfunction of lumbar region: Secondary | ICD-10-CM | POA: Diagnosis not present

## 2018-03-13 DIAGNOSIS — M545 Low back pain: Secondary | ICD-10-CM | POA: Diagnosis not present

## 2018-03-13 DIAGNOSIS — M546 Pain in thoracic spine: Secondary | ICD-10-CM | POA: Diagnosis not present

## 2018-03-13 DIAGNOSIS — M9902 Segmental and somatic dysfunction of thoracic region: Secondary | ICD-10-CM | POA: Diagnosis not present

## 2018-03-21 DIAGNOSIS — M546 Pain in thoracic spine: Secondary | ICD-10-CM | POA: Diagnosis not present

## 2018-03-21 DIAGNOSIS — M9902 Segmental and somatic dysfunction of thoracic region: Secondary | ICD-10-CM | POA: Diagnosis not present

## 2018-03-21 DIAGNOSIS — M545 Low back pain: Secondary | ICD-10-CM | POA: Diagnosis not present

## 2018-03-21 DIAGNOSIS — M9903 Segmental and somatic dysfunction of lumbar region: Secondary | ICD-10-CM | POA: Diagnosis not present

## 2018-06-07 ENCOUNTER — Encounter: Payer: Self-pay | Admitting: Family Medicine

## 2018-06-07 ENCOUNTER — Ambulatory Visit: Payer: Medicare Other | Admitting: Family Medicine

## 2018-06-07 VITALS — BP 134/82 | HR 72 | Temp 98.4°F | Ht 64.0 in | Wt 190.4 lb

## 2018-06-07 DIAGNOSIS — Z91018 Allergy to other foods: Secondary | ICD-10-CM | POA: Diagnosis not present

## 2018-06-07 DIAGNOSIS — R220 Localized swelling, mass and lump, head: Secondary | ICD-10-CM

## 2018-06-07 DIAGNOSIS — R739 Hyperglycemia, unspecified: Secondary | ICD-10-CM

## 2018-06-07 LAB — GLUCOSE, POCT (MANUAL RESULT ENTRY): POC GLUCOSE: 100 mg/dL — AB (ref 70–99)

## 2018-06-07 MED ORDER — PREDNISONE 20 MG PO TABS: 40.0000 mg | ORAL_TABLET | Freq: Every day | ORAL | 0 refills | Status: DC

## 2018-06-07 NOTE — Progress Notes (Signed)
Subjective:    Patient ID: Toni Williams, female    DOB: Feb 07, 1951, 67 y.o.   MRN: [id number]  HPI Toni Williams is a 67 y.o. female Presents today for: Chief Complaint  Patient presents with  . swollen lips    more the uper lip. started like a cold sore and now its both lips. husband said ate too many cherries.   Presents with lip swelling.  History of tobacco abuse, eczema, breast cancer, hypertension, not on ACE inhibitor.  Started 2 days ago. Saw small cold sore on top lip. Proceeded to swell more past 2 days. On left primarily. No tongue swelling, no sore throat or  difficulty swallowing. No wheeze/dyspnea. No hives or other rash. Slight tingling, not painful. Slight discharge this morning. Applied alcohol. Unsure if has had cold sores in past - just swelling. Has eaten some more fresh cherries 2 days ago.   Lip swelling few years ago 09/2016 with suspected shellfish allergy. Treated with solumedrol, prednisone, zyrtec. Last shellfish intake a few weeks ago but no lip swelling at that time. Had some allergy testing years ago. Allergist Dr. Rocky Morel, no recent eval.   Tx: zyrtec QD past 2 days.   Elevated glucose on prior labs, last noted at [facility] in April - 125  Patient Active Problem List   Diagnosis Date Noted  . Chest pain 05/17/2012  . Abnormal resting ECG findings 04/24/2012  . Vitamin D deficiency 04/24/2012  . Hypertension 02/11/2012  . Breast cancer ([place]) 02/11/2012  . Eczema 02/11/2012  . Allergic rhinitis due to allergen 02/11/2012   Past Medical History:  Diagnosis Date  . Anxiety    no meds  . Blood transfusion 1979   with missed abortion in [facility] -Dr [place]  . Cancer ([place])   . Hypertension   . Missed abortion    x 1  . Seasonal allergies   . Termination of pregnancy    x 1   Past Surgical History:  Procedure Laterality Date  . BREAST SURGERY  10/03/2007, 11/06/2007   Left breast excision, re-excision Left breast  . BREAST  SURGERY     No IV sticks/Blood Draws/Blood Pressure Left Arm,  . CESAREAN SECTION    . CHOLECYSTECTOMY    . colonscopy    . DILATION AND CURETTAGE OF UTERUS    . HYSTEROSCOPY W/D&C  10/27/2011   Procedure: DILATATION AND CURETTAGE /HYSTEROSCOPY;  Surgeon: Delice Lesch, MD;  Location: [facility];  Service: Gynecology;  Laterality: N/A;  . WISDOM TOOTH EXTRACTION     Allergies  Allergen Reactions  . Morphine And Related Other (See Comments)    "psychotic" reaction per pt report  . Shellfish Allergy Swelling  . Latex Rash    itching   Prior to Admission medications   Medication Sig Start Date End Date Taking? Authorizing Provider  cholecalciferol (VITAMIN D) 1000 UNITS tablet Take 2,000 Units by mouth daily.     Yes [provider]  hydrochlorothiazide (HYDRODIURIL) 25 MG tablet TAKE 1 TABLET BY MOUTH EVERY DAY 01/07/18  Yes Wendie Agreste, MD  methotrexate 2.5 MG tablet Take by mouth 3 (three) times a week.   Yes [provider]  azelastine (ASTELIN) 0.1 % nasal spray Place 1 spray into both nostrils 2 (two) times daily. Use in each nostril as directed Patient not taking: Reported on 06/07/2018 07/10/17   Wendie Agreste, MD   Social History   Socioeconomic History  . Marital status: Married  Spouse name: Not on file  . Number of children: Not on file  . Years of education: Not on file  . Highest education level: Not on file  Occupational History  . Not on file  Social Needs  . Financial resource strain: Not on file  . Food insecurity:    Worry: Not on file    Inability: Not on file  . Transportation needs:    Medical: Not on file    Non-medical: Not on file  Tobacco Use  . Smoking status: Former Smoker    Packs/day: 0.25    Years: 2.00    Pack years: 0.50    Types: Cigarettes    Last attempt to quit: 10/30/1976    Years since quitting: 41.6  . Smokeless tobacco: Never Used  Substance and Sexual Activity  . Alcohol use: No  . Drug use: No  .  Sexual activity: Not Currently    Birth control/protection: None  Lifestyle  . Physical activity:    Days per week: Not on file    Minutes per session: Not on file  . Stress: Not on file  Relationships  . Social connections:    Talks on phone: Not on file    Gets together: Not on file    Attends religious service: Not on file    Active member of club or organization: Not on file    Attends meetings of clubs or organizations: Not on file    Relationship status: Not on file  . Intimate partner violence:    Fear of current or ex partner: Not on file    Emotionally abused: Not on file    Physically abused: Not on file    Forced sexual activity: Not on file  Other Topics Concern  . Not on file  Social History Narrative  . Not on file    Review of Systems  Constitutional: Negative for chills and fever.  HENT: Positive for facial swelling (lips only. ).   Respiratory: Negative for cough, shortness of breath and wheezing.   Skin: Negative for rash.      Objective:   Physical Exam  Constitutional: She is oriented to person, place, and time. She appears well-developed and well-nourished. No distress.  HENT:  Head: Normocephalic and atraumatic.  Right Ear: Hearing, tympanic membrane, external ear and ear canal normal.  Left Ear: Hearing, tympanic membrane, external ear and ear canal normal.  Nose: Nose normal.  Mouth/Throat: Oropharynx is clear and moist and mucous membranes are normal. No oral lesions. No trismus in the jaw. No uvula swelling. No oropharyngeal exudate.    Appears slightly abraded/irritated upper left lip greater than mid to the left lower lip.  There is some superficial cracking as well as some dried blood on upper lip.  Slight irritation and fine papules above the lip on the left, and at the left angle.  No apparent discharge.  She does have upper greater than lower lip swelling, primarily on the left side.  No oral lesions, no tongue swelling.  Clearing secretions  normally, normal speech.  Eyes: Pupils are equal, round, and reactive to light. Conjunctivae and EOM are normal.  Cardiovascular: Normal rate, regular rhythm, normal heart sounds and intact distal pulses.  No murmur heard. Pulmonary/Chest: Effort normal and breath sounds normal. No respiratory distress. She has no wheezes. She has no rhonchi.  Neurological: She is alert and oriented to person, place, and time.  Skin: Skin is warm and dry. No rash noted.  Psychiatric:  She has a normal mood and affect. Her behavior is normal.  Vitals reviewed.  Vitals:   06/07/18 0901  BP: 134/82  Pulse: 72  Temp: 98.4 F (36.9 C)  TempSrc: Oral  SpO2: 99%  Weight: 190 lb 6.4 oz (86.4 kg)  Height: 5\' 4"  (1.626 m)   Results for orders placed or performed in visit on 06/07/18  POCT glucose (manual entry)  Result Value Ref Range   POC Glucose 100 (A) 70 - 99 mg/dl       Assessment & Plan:   MALEEHA HALLS is a 67 y.o. female Lip swelling - Plan: predniSONE (DELTASONE) 20 MG tablet  Food allergy - Plan: predniSONE (DELTASONE) 20 MG tablet  Hyperglycemia - Plan: POCT glucose (manual entry), CANCELED: POCT glycosylated hemoglobin (Hb A1C)  Initial irritation, then followed by lip swelling.  Less likely angioedema given history and appearance today.  Possible contact dermatitis versus food allergy with recent intake of cherries.  No vesicles, less likely HSV, no rash extending on same face dermatome, less likely shingles.  Some increased irritation this morning may be due to use of alcohol to area.  -Okay to continue Zyrtec, but recommended Benadryl for acute swelling over the next 24 hours, then transition back to Zyrtec alone  -Start prednisone 40 mg daily x5 days.  Potential side effects discussed.  Hyperglycemia in the past, okay as above  -Follow-up with allergist for possible testing given recent symptoms.  -ER/RTC precautions discussed if any worsening or new symptoms, specifically for  signs or symptoms of angioedema.  Understanding expressed   Meds ordered this encounter  Medications  . predniSONE (DELTASONE) 20 MG tablet    Sig: Take 2 tablets (40 mg total) by mouth daily with breakfast.    Dispense:  10 tablet    Refill:  0   Patient Instructions   Swelling of lips appears to be due to local irritation or reaction.  Possibly from cherries.  Avoid further intake of cherries until discussed that with your allergist and further testing.  For current rash and irritation, start prednisone 2 pills/day for the next 5 days, Benadryl over-the-counter, then switch to Zyrtec as swelling improves.  Additionally apply Vaseline intensive care lip treatment to affected areas.  Avoid alcohol to that area at this time as it may cause more irritation.  If any worsening swelling, tongue or other mouth swelling, shortness of breath, trouble swallowing, or new rash, be seen in the emergency room right away.  Please call your allergist for appointment for further testing and discussion of current symptoms.   Thank you for coming in today.  Return to the clinic or go to the nearest emergency room if any of your symptoms worsen or new symptoms occur.    IF you received an x-ray today, you will receive an invoice from [facility]. Please contact [facility] at [phone] with questions or concerns regarding your invoice.   IF you received labwork today, you will receive an invoice from [place]. Please contact [facility] at [phone] with questions or concerns regarding your invoice.   Our billing staff will not be able to assist you with questions regarding bills from these companies.  You will be contacted with the lab results as soon as they are available. The fastest way to get your results is to activate your My Chart account. Instructions are located on the last page of this paperwork. If you have not heard from [place] regarding the results in 2 weeks, please contact  this office.       Signed,   Merri Ray, MD [facility] [place].  06/07/18 2:08 PM

## 2018-06-07 NOTE — Patient Instructions (Addendum)
Swelling of lips appears to be due to local irritation or reaction.  Possibly from cherries.  Avoid further intake of cherries until discussed that with your allergist and further testing.  For current rash and irritation, start prednisone 2 pills/day for the next 5 days, Benadryl over-the-counter, then switch to Zyrtec as swelling improves.  Additionally apply Vaseline intensive care lip treatment to affected areas.  Avoid alcohol to that area at this time as it may cause more irritation.  If any worsening swelling, tongue or other mouth swelling, shortness of breath, trouble swallowing, or new rash, be seen in the emergency room right away.  Please call your allergist for appointment for further testing and discussion of current symptoms.   Thank you for coming in today.  Return to the clinic or go to the nearest emergency room if any of your symptoms worsen or new symptoms occur.    IF you received an x-ray today, you will receive an invoice from [facility]. Please contact [facility] at [phone] with questions or concerns regarding your invoice.   IF you received labwork today, you will receive an invoice from [place]. Please contact [facility] at [phone] with questions or concerns regarding your invoice.   Our billing staff will not be able to assist you with questions regarding bills from these companies.  You will be contacted with the lab results as soon as they are available. The fastest way to get your results is to activate your My Chart account. Instructions are located on the last page of this paperwork. If you have not heard from [place] regarding the results in 2 weeks, please contact this office.

## 2018-06-13 DIAGNOSIS — L2084 Intrinsic (allergic) eczema: Secondary | ICD-10-CM | POA: Diagnosis not present

## 2018-06-13 DIAGNOSIS — Z5181 Encounter for therapeutic drug level monitoring: Secondary | ICD-10-CM | POA: Diagnosis not present

## 2018-07-04 ENCOUNTER — Ambulatory Visit: Payer: Medicare Other | Admitting: Family Medicine

## 2018-07-04 ENCOUNTER — Encounter: Payer: Self-pay | Admitting: Family Medicine

## 2018-07-04 ENCOUNTER — Other Ambulatory Visit: Payer: Self-pay | Admitting: Family Medicine

## 2018-07-04 ENCOUNTER — Other Ambulatory Visit: Payer: Self-pay

## 2018-07-04 VITALS — BP 122/76 | HR 79 | Temp 97.9°F | Ht 64.0 in | Wt 190.2 lb

## 2018-07-04 DIAGNOSIS — R739 Hyperglycemia, unspecified: Secondary | ICD-10-CM | POA: Diagnosis not present

## 2018-07-04 DIAGNOSIS — I10 Essential (primary) hypertension: Secondary | ICD-10-CM

## 2018-07-04 DIAGNOSIS — E785 Hyperlipidemia, unspecified: Secondary | ICD-10-CM | POA: Diagnosis not present

## 2018-07-04 MED ORDER — HYDROCHLOROTHIAZIDE 25 MG PO TABS: 25.0000 mg | ORAL_TABLET | Freq: Every day | ORAL | 2 refills | Status: DC

## 2018-07-04 NOTE — Telephone Encounter (Signed)
Left VM to CB re: refill request. Pt needs appt.  LOV when HTN addressed 07/10/17

## 2018-07-04 NOTE — Progress Notes (Signed)
 Subjective:    Patient ID: Toni Williams, female    DOB: 03/09/1951, 67 y.o.   MRN: [id number]  HPI Toni Williams is a 66 y.o. female Presents today for: Chief Complaint  Patient presents with  . Medication Refill    HCTZ   Hypertension: BP Readings from Last 3 Encounters:  07/04/18 122/76  06/07/18 134/82  07/10/17 125/81   Lab Results  Component Value Date   CREATININE 0.74 07/10/2017  no new side effects of meds.  Labs noted 06/13/18 at [facility]: creat 0.99, egfr 68. Normal CMP   See last OV regarding lip swelling. Has not recurred. Has follow up with allergist planned, has not scheduled appointment. Not needing antihistamine at this point.    Hyperglycemia:  - glucose 125 on 02/14/18, then 144 on 06/13/18 at [facility].   - no increased thirst, urinary frequency, nocturia, blurry vision. No abd pain, n/v.   - has lost 17 pounds since last year with diet and exercise.   Hyperlipidemia:  Lab Results  Component Value Date   CHOL 186 10/10/2014   HDL 53 10/10/2014   LDLCALC 117 (H) 10/10/2014   TRIG 81 10/10/2014   CHOLHDL 3.5 10/10/2014   Lab Results  Component Value Date   ALT 19 10/10/2014   AST 20 10/10/2014   ALKPHOS 92 10/10/2014   BILITOT 0.8 10/10/2014  more recent labs above at [facility]. Ate about 4.5 hrs ago.    HM: Colonoscopy about 8 years ago - repeat in 10 yrs.  Declines flu vaccine and pneumonia vaccine.      Patient Active Problem List   Diagnosis Date Noted  . Chest pain 05/17/2012  . Abnormal resting ECG findings 04/24/2012  . Vitamin D deficiency 04/24/2012  . Hypertension 02/11/2012  . Breast cancer (HCC) 02/11/2012  . Eczema 02/11/2012  . Allergic rhinitis due to allergen 02/11/2012   Past Medical History:  Diagnosis Date  . Anxiety    no meds  . Blood transfusion 1979   with missed abortion in [place] -Dr Hoffman  . Cancer (HCC)   . Hypertension   . Missed abortion    x 1  . Seasonal allergies   . Termination of  pregnancy    x 1   Past Surgical History:  Procedure Laterality Date  . BREAST SURGERY  10/03/2007, 11/06/2007   Left breast excision, re-excision Left breast  . BREAST SURGERY     No IV sticks/Blood Draws/Blood Pressure Left Arm,  . CESAREAN SECTION    . CHOLECYSTECTOMY    . colonscopy    . DILATION AND CURETTAGE OF UTERUS    . HYSTEROSCOPY W/D&C  10/27/2011   Procedure: DILATATION AND CURETTAGE /HYSTEROSCOPY;  Surgeon: Angela Y Roberts, MD;  Location: [facility];  Service: Gynecology;  Laterality: N/A;  . WISDOM TOOTH EXTRACTION     Allergies  Allergen Reactions  . Morphine And Related Other (See Comments)    "psychotic" reaction per pt report  . Shellfish Allergy Swelling  . Latex Rash    itching   Prior to Admission medications   Medication Sig Start Date End Date Taking? Authorizing Provider  cholecalciferol (VITAMIN D) 1000 UNITS tablet Take 2,000 Units by mouth daily.     Yes [provider]  hydrochlorothiazide (HYDRODIURIL) 25 MG tablet TAKE 1 TABLET BY MOUTH EVERY DAY 07/04/18  Yes Greene, Jeffrey R, MD  methotrexate 2.5 MG tablet Take by mouth 3 (three) times a week.   Yes [provider]    azelastine (ASTELIN) 0.1 % nasal spray Place 1 spray into both nostrils 2 (two) times daily. Use in each nostril as directed Patient not taking: Reported on 06/07/2018 07/10/17   Wendie Agreste, MD   Social History   Socioeconomic History  . Marital status: Married    Spouse name: Not on file  . Number of children: Not on file  . Years of education: Not on file  . Highest education level: Not on file  Occupational History  . Not on file  Social Needs  . Financial resource strain: Not on file  . Food insecurity:    Worry: Not on file    Inability: Not on file  . Transportation needs:    Medical: Not on file    Non-medical: Not on file  Tobacco Use  . Smoking status: Former Smoker    Packs/day: 0.25    Years: 2.00    Pack years: 0.50    Types: Cigarettes     Last attempt to quit: 10/30/1976    Years since quitting: 41.7  . Smokeless tobacco: Never Used  Substance and Sexual Activity  . Alcohol use: No  . Drug use: No  . Sexual activity: Not Currently    Birth control/protection: None  Lifestyle  . Physical activity:    Days per week: Not on file    Minutes per session: Not on file  . Stress: Not on file  Relationships  . Social connections:    Talks on phone: Not on file    Gets together: Not on file    Attends religious service: Not on file    Active member of club or organization: Not on file    Attends meetings of clubs or organizations: Not on file    Relationship status: Not on file  . Intimate partner violence:    Fear of current or ex partner: Not on file    Emotionally abused: Not on file    Physically abused: Not on file    Forced sexual activity: Not on file  Other Topics Concern  . Not on file  Social History Narrative  . Not on file    Review of Systems  Constitutional: Negative for fatigue and unexpected weight change.  Respiratory: Negative for chest tightness and shortness of breath.   Cardiovascular: Negative for chest pain, palpitations and leg swelling.  Gastrointestinal: Negative for abdominal pain and blood in stool.  Neurological: Negative for dizziness, syncope, light-headedness and headaches.      Objective:   Physical Exam  Constitutional: She is oriented to person, place, and time. She appears well-developed and well-nourished.  HENT:  Head: Normocephalic and atraumatic.  Eyes: Pupils are equal, round, and reactive to light. Conjunctivae and EOM are normal.  Neck: Carotid bruit is not present.  Cardiovascular: Normal rate, regular rhythm, normal heart sounds and intact distal pulses.  Pulmonary/Chest: Effort normal and breath sounds normal.  Abdominal: Soft. She exhibits no pulsatile midline mass. There is no tenderness.  Neurological: She is alert and oriented to person, place, and time.  Skin:  Skin is warm and dry.  Psychiatric: She has a normal mood and affect. Her behavior is normal.  Vitals reviewed.  Vitals:   07/04/18 1735  BP: 122/76  Pulse: 79  Temp: 97.9 F (36.6 C)  TempSrc: Oral  SpO2: 98%  Weight: 190 lb 3.2 oz (86.3 kg)  Height: 5' 4" (1.626 m)       Assessment & Plan:   [name] is  a 66 y.o. female Essential hypertension - Plan: Lipid panel, hydrochlorothiazide (HYDRODIURIL) 25 MG tablet  -  Stable, tolerating current regimen. Medications refilled. Labs pending as above. /  Hyperlipidemia, unspecified hyperlipidemia type - Plan: Lipid panel  -Mild elevation previously.  Check lipid panel, but if elevated will need to be repeated after 8 hours of fasting.  Hyperglycemia - Plan: Hemoglobin A1c  -Commended on diet and exercise changes for weight loss.  Check A1c with recent hyperglycemia noted on outside labs.  Health maintenance: Declined flu vaccine, pneumonia vaccines.  Colonoscopy reportedly 8 years ago with repeat planned in 10 years.  Wellness visit/physical within the next 6 months.    Meds ordered this encounter  Medications  . hydrochlorothiazide (HYDRODIURIL) 25 MG tablet    Sig: Take 1 tablet (25 mg total) by mouth daily.    Dispense:  90 tablet    Refill:  2   Patient Instructions   No change in blood pressure medication today. I did check some blood work including prediabetes test as well as cholesterol.  If cholesterol is elevated, that may need to be repeated after fasting for 8 hours.  The prediabetes test does not need to be fasting as that is a 3-month average.  Please let me know the exact date of your last colonoscopy if possible and we can update the chart.  I do recommend flu vaccine as well as pneumonia vaccines, please return if you would like to have those given.  Please follow-up within the next 6 months for a Medicare wellness exam/physical.  Let me know if there are any questions in the meantime.  Thank you  for coming in today.   If you have lab work done today you will be contacted with your lab results within the next 2 weeks.  If you have not heard from us then please contact us. The fastest way to get your results is to register for My Chart.   IF you received an x-ray today, you will receive an invoice from [facility]. Please contact [facility] at [phone] with questions or concerns regarding your invoice.   IF you received labwork today, you will receive an invoice from [facility]. Please contact [facility] at [phone] with questions or concerns regarding your invoice.   Our billing staff will not be able to assist you with questions regarding bills from these companies.  You will be contacted with the lab results as soon as they are available. The fastest way to get your results is to activate your My Chart account. Instructions are located on the last page of this paperwork. If you have not heard from us regarding the results in 2 weeks, please contact this office.       Signed,   Jeffrey Greene, MD [facility].  07/04/18 6:20 PM   

## 2018-07-04 NOTE — Patient Instructions (Addendum)
No change in blood pressure medication today. I did check some blood work including prediabetes test as well as cholesterol.  If cholesterol is elevated, that may need to be repeated after fasting for 8 hours.  The prediabetes test does not need to be fasting as that is a 82-month average.  Please let me know the exact date of your last colonoscopy if possible and we can update the chart.  I do recommend flu vaccine as well as pneumonia vaccines, please return if you would like to have those given.  Please follow-up within the next 6 months for a Medicare wellness exam/physical.  Let me know if there are any questions in the meantime.  Thank you for coming in today.   If you have lab work done today you will be contacted with your lab results within the next 2 weeks.  If you have not heard from [place] then please contact us. The fastest way to get your results is to register for My Chart.   IF you received an x-ray today, you will receive an invoice from [facility]. Please contact [facility] at [phone] with questions or concerns regarding your invoice.   IF you received labwork today, you will receive an invoice from [facility]. Please contact [facility] at [phone] with questions or concerns regarding your invoice.   Our billing staff will not be able to assist you with questions regarding bills from these companies.  You will be contacted with the lab results as soon as they are available. The fastest way to get your results is to activate your My Chart account. Instructions are located on the last page of this paperwork. If you have not heard from [place] regarding the results in 2 weeks, please contact this office.

## 2018-07-04 NOTE — Telephone Encounter (Signed)
Refill of hydrodiuril  LRF 04/10/18  #90  1 refill   Pt has appt scheduled for today

## 2018-07-05 LAB — LIPID PANEL
CHOLESTEROL TOTAL: 202 mg/dL — AB (ref 100–199)
Chol/HDL Ratio: 3.4 ratio (ref 0.0–4.4)
HDL: 60 mg/dL (ref >39)
LDL CALC: 118 mg/dL — AB (ref 0–99)
TRIGLYCERIDES: 121 mg/dL (ref 0–149)
VLDL Cholesterol Cal: 24 mg/dL (ref 5–40)

## 2018-07-05 LAB — HEMOGLOBIN A1C
Est. average glucose Bld gHb Est-mCnc: 140 mg/dL
Hgb A1c MFr Bld: 6.5 % — ABNORMAL HIGH (ref 4.8–5.6)

## 2018-10-17 DIAGNOSIS — L2084 Intrinsic (allergic) eczema: Secondary | ICD-10-CM | POA: Diagnosis not present

## 2018-10-17 DIAGNOSIS — Z5181 Encounter for therapeutic drug level monitoring: Secondary | ICD-10-CM | POA: Diagnosis not present

## 2018-10-21 DIAGNOSIS — N950 Postmenopausal bleeding: Secondary | ICD-10-CM | POA: Diagnosis not present

## 2018-10-21 DIAGNOSIS — Z124 Encounter for screening for malignant neoplasm of cervix: Secondary | ICD-10-CM | POA: Diagnosis not present

## 2019-01-16 DIAGNOSIS — Z5181 Encounter for therapeutic drug level monitoring: Secondary | ICD-10-CM | POA: Diagnosis not present

## 2019-01-16 DIAGNOSIS — Z79899 Other long term (current) drug therapy: Secondary | ICD-10-CM | POA: Diagnosis not present

## 2019-01-16 DIAGNOSIS — L2084 Intrinsic (allergic) eczema: Secondary | ICD-10-CM | POA: Diagnosis not present

## 2019-04-07 ENCOUNTER — Other Ambulatory Visit: Payer: Self-pay | Admitting: Family Medicine

## 2019-04-07 DIAGNOSIS — I10 Essential (primary) hypertension: Secondary | ICD-10-CM

## 2019-04-07 NOTE — Telephone Encounter (Signed)
Call to patient- left message to make appointment for medication follow up. Courtesy 30 day refill given Requested Prescriptions  Pending Prescriptions Disp Refills  . hydrochlorothiazide (HYDRODIURIL) 25 MG tablet [Pharmacy Med Name: HYDROCHLOROTHIAZIDE 25 MG TAB] 30 tablet 0    Sig: TAKE 1 TABLET BY MOUTH EVERY DAY     Cardiovascular: Diuretics - Thiazide Failed - 04/07/2019  1:19 AM      Failed - Ca in normal range and within 360 days    Calcium  Date Value Ref Range Status  07/10/2017 9.8 8.7 - 10.3 mg/dL Final  09/23/2013 9.4 8.4 - 10.4 mg/dL Final         Failed - Cr in normal range and within 360 days    Creatinine  Date Value Ref Range Status  09/23/2013 0.7 0.6 - 1.1 mg/dL Final   Creat  Date Value Ref Range Status  01/26/2016 0.63 0.50 - 0.99 mg/dL Final   Creatinine, Ser  Date Value Ref Range Status  07/10/2017 0.74 0.57 - 1.00 mg/dL Final         Failed - K in normal range and within 360 days    Potassium  Date Value Ref Range Status  07/10/2017 3.9 3.5 - 5.2 mmol/L Final  09/23/2013 3.7 3.5 - 5.1 mEq/L Final         Failed - Na in normal range and within 360 days    Sodium  Date Value Ref Range Status  07/10/2017 141 134 - 144 mmol/L Final  09/23/2013 143 136 - 145 mEq/L Final         Failed - Valid encounter within last 6 months    Recent Outpatient Visits          9 months ago Essential hypertension   [facility], Ranell Patrick, MD   10 months ago Lip swelling   [facility], Ranell Patrick, MD   1 year ago Essential hypertension   [facility], Ranell Patrick, MD   2 years ago Rash and nonspecific skin eruption   [facility], Ranell Patrick, MD   2 years ago Allergic reaction, initial encounter   [facility], Kayleen Memos, MD             Passed - Last BP in normal range    BP Readings from Last 1 Encounters:  07/04/18 122/76

## 2019-04-21 ENCOUNTER — Other Ambulatory Visit: Payer: Self-pay | Admitting: *Deleted

## 2019-04-21 DIAGNOSIS — Z20822 Contact with and (suspected) exposure to covid-19: Secondary | ICD-10-CM

## 2019-04-27 LAB — NOVEL CORONAVIRUS, NAA: SARS-CoV-2, NAA: NOT DETECTED

## 2019-05-01 ENCOUNTER — Other Ambulatory Visit: Payer: Self-pay | Admitting: Family Medicine

## 2019-05-01 DIAGNOSIS — I10 Essential (primary) hypertension: Secondary | ICD-10-CM

## 2019-05-01 DIAGNOSIS — Z853 Personal history of malignant neoplasm of breast: Secondary | ICD-10-CM | POA: Diagnosis not present

## 2019-05-01 DIAGNOSIS — Z1231 Encounter for screening mammogram for malignant neoplasm of breast: Secondary | ICD-10-CM | POA: Diagnosis not present

## 2019-05-01 LAB — HM MAMMOGRAPHY: HM Mammogram: NORMAL (ref 0–4)

## 2019-05-01 NOTE — Telephone Encounter (Signed)
Requested medication (s) are due for refill today: yes  Requested medication (s) are on the active medication list: yes  Last refill:  04/07/2019 courtesy refill  Future visit scheduled: no  Notes to clinic:  No valid encounter in last 6 months    Requested Prescriptions  Pending Prescriptions Disp Refills   hydrochlorothiazide (HYDRODIURIL) 25 MG tablet [Pharmacy Med Name: HYDROCHLOROTHIAZIDE 25 MG TAB] 30 tablet 0    Sig: TAKE 1 TABLET BY MOUTH EVERY DAY     Cardiovascular: Diuretics - Thiazide Failed - 05/01/2019  1:34 PM      Failed - Ca in normal range and within 360 days    Calcium  Date Value Ref Range Status  07/10/2017 9.8 8.7 - 10.3 mg/dL Final  09/23/2013 9.4 8.4 - 10.4 mg/dL Final         Failed - Cr in normal range and within 360 days    Creatinine  Date Value Ref Range Status  09/23/2013 0.7 0.6 - 1.1 mg/dL Final   Creat  Date Value Ref Range Status  01/26/2016 0.63 0.50 - 0.99 mg/dL Final   Creatinine, Ser  Date Value Ref Range Status  07/10/2017 0.74 0.57 - 1.00 mg/dL Final         Failed - K in normal range and within 360 days    Potassium  Date Value Ref Range Status  07/10/2017 3.9 3.5 - 5.2 mmol/L Final  09/23/2013 3.7 3.5 - 5.1 mEq/L Final         Failed - Na in normal range and within 360 days    Sodium  Date Value Ref Range Status  07/10/2017 141 134 - 144 mmol/L Final  09/23/2013 143 136 - 145 mEq/L Final         Failed - Valid encounter within last 6 months    Recent Outpatient Visits          10 months ago Essential hypertension   [facility], MD   10 months ago Lip swelling   [facility], Ranell Patrick, MD   1 year ago Essential hypertension   [facility], Ranell Patrick, MD   2 years ago Rash and nonspecific skin eruption   [facility], Ranell Patrick, MD   2 years ago Allergic reaction, initial encounter   [facility], Kayleen Memos, MD              Passed - Last BP in normal range    BP Readings from Last 1 Encounters:  07/04/18 122/76

## 2019-05-22 DIAGNOSIS — Z79899 Other long term (current) drug therapy: Secondary | ICD-10-CM | POA: Diagnosis not present

## 2019-05-22 DIAGNOSIS — Z5181 Encounter for therapeutic drug level monitoring: Secondary | ICD-10-CM | POA: Diagnosis not present

## 2019-05-22 DIAGNOSIS — L2084 Intrinsic (allergic) eczema: Secondary | ICD-10-CM | POA: Diagnosis not present

## 2019-05-24 ENCOUNTER — Other Ambulatory Visit: Payer: Self-pay | Admitting: Family Medicine

## 2019-05-24 DIAGNOSIS — I10 Essential (primary) hypertension: Secondary | ICD-10-CM

## 2019-05-28 ENCOUNTER — Encounter: Payer: Self-pay | Admitting: Family Medicine

## 2019-05-29 ENCOUNTER — Telehealth: Payer: Self-pay | Admitting: Family Medicine

## 2019-05-29 NOTE — Telephone Encounter (Signed)
Patient is calling to schedule CPE Please advise CB- [phone]

## 2019-05-29 NOTE — Telephone Encounter (Signed)
Please assist with making appt

## 2019-06-05 ENCOUNTER — Ambulatory Visit (INDEPENDENT_AMBULATORY_CARE_PROVIDER_SITE_OTHER): Payer: Medicare Other | Admitting: Family Medicine

## 2019-06-05 ENCOUNTER — Other Ambulatory Visit: Payer: Self-pay

## 2019-06-05 ENCOUNTER — Encounter: Payer: Self-pay | Admitting: Family Medicine

## 2019-06-05 VITALS — BP 140/80 | HR 108 | Temp 98.1°F | Resp 14 | Wt 192.0 lb

## 2019-06-05 DIAGNOSIS — E785 Hyperlipidemia, unspecified: Secondary | ICD-10-CM

## 2019-06-05 DIAGNOSIS — J309 Allergic rhinitis, unspecified: Secondary | ICD-10-CM

## 2019-06-05 DIAGNOSIS — Z0001 Encounter for general adult medical examination with abnormal findings: Secondary | ICD-10-CM

## 2019-06-05 DIAGNOSIS — R0981 Nasal congestion: Secondary | ICD-10-CM

## 2019-06-05 DIAGNOSIS — R002 Palpitations: Secondary | ICD-10-CM

## 2019-06-05 DIAGNOSIS — I10 Essential (primary) hypertension: Secondary | ICD-10-CM | POA: Diagnosis not present

## 2019-06-05 DIAGNOSIS — R739 Hyperglycemia, unspecified: Secondary | ICD-10-CM | POA: Diagnosis not present

## 2019-06-05 DIAGNOSIS — Z0000 Encounter for general adult medical examination without abnormal findings: Secondary | ICD-10-CM

## 2019-06-05 MED ORDER — AZELASTINE HCL 0.1 % NA SOLN: 1.0000 | Freq: Two times a day (BID) | NASAL | 11 refills | Status: DC

## 2019-06-05 MED ORDER — FLUTICASONE PROPIONATE 50 MCG/ACT NA SUSP: 1.0000 | Freq: Every day | NASAL | 5 refills | Status: DC

## 2019-06-05 MED ORDER — HYDROCHLOROTHIAZIDE 25 MG PO TABS: 25.0000 mg | ORAL_TABLET | Freq: Every day | ORAL | 1 refills | Status: DC

## 2019-06-05 NOTE — Patient Instructions (Addendum)
I will refer you to cardiology to discuss the chest symptoms further. If those worsen, go to emergency room or call 911.   Info on vaccines below. Let me know if you change your mind and I can order them.  [email or web address]  See information below on stress, but I would like to follow-up with you in 1 month to review the symptoms and lab work further at that time if needed.  As shoulder pain is improving, I expect that to continue to get better.  If any increased pain, or limitation in range of motion or strength, be seen right away.  Flonase or Astelin nasal spray can be used but typically should not need both.  I would start with Flonase first as that can be less drying.  Can discuss that further in the next month as well.  Return to the clinic or go to the nearest emergency room if any of your symptoms worsen or new symptoms occur.   Palpitations Palpitations are feelings that your heartbeat is irregular or is faster than normal. It may feel like your heart is fluttering or skipping a beat. Palpitations are usually not a serious problem. They may be caused by many things, including smoking, caffeine, alcohol, stress, and certain medicines or drugs. Most causes of palpitations are not serious. However, some palpitations can be a sign of a serious problem. You may need further tests to rule out serious medical problems. Follow these instructions at home:     Pay attention to any changes in your condition. Take these actions to help manage your symptoms: Eating and drinking  Avoid foods and drinks that may cause palpitations. These may include: ? Caffeinated coffee, tea, soft drinks, diet pills, and energy drinks. ? Chocolate. ? Alcohol. Lifestyle  Take steps to reduce your stress and anxiety. Things that can help you relax include: ? Yoga. ? Mind-body activities, such as deep breathing, meditation, or using words and images to create positive thoughts  (guided imagery). ? Physical activity, such as swimming, jogging, or walking. Tell your health care provider if your palpitations increase with activity. If you have chest pain or shortness of breath with activity, do not continue the activity until you are seen by your health care provider. ? Biofeedback. This is a method that helps you learn to use your mind to control things in your body, such as your heartbeat.  Do not use drugs, including cocaine or ecstasy. Do not use marijuana.  Get plenty of rest and sleep. Keep a regular bed time. General instructions  Take over-the-counter and prescription medicines only as told by your health care provider.  Do not use any products that contain nicotine or tobacco, such as cigarettes and e-cigarettes. If you need help quitting, ask your health care provider.  Keep all follow-up visits as told by your health care provider. This is important. These may include visits for further testing if palpitations do not go away or get worse. Contact a health care provider if you:  Continue to have a fast or irregular heartbeat after 24 hours.  Notice that your palpitations occur more often. Get help right away if you:  Have chest pain or shortness of breath.  Have a severe headache.  Feel dizzy or you faint. Summary  Palpitations are feelings that your heartbeat is irregular or is faster than normal. It may feel like your heart is fluttering or skipping a beat.  Palpitations may be caused by many things, including smoking, caffeine, alcohol, stress, certain  medicines, and drugs.  Although most causes of palpitations are not serious, some causes can be a sign of a serious medical problem.  Get help right away if you faint or have chest pain, shortness of breath, a severe headache, or dizziness. This information is not intended to replace advice given to you by your health care provider. Make sure you discuss any questions you have with your health  care provider. Document Released: 10/13/2000 Document Revised: 11/28/2017 Document Reviewed: 11/28/2017 Elsevier Patient Education  2020 [place] is a normal reaction to life events. Stress is what you feel when life demands more than you are used to, or more than you think you can handle. Some stress can be useful, such as studying for a test or meeting a deadline at work. Stress that occurs too often or for too long can cause problems. It can affect your emotional health and interfere with relationships and normal daily activities. Too much stress can weaken your body's defense system (immune system) and increase your risk for physical illness. If you already have a medical problem, stress can make it worse. What are the causes? All sorts of life events can cause stress. An event that causes stress for one person may not be stressful for another person. Major life events, whether positive or negative, commonly cause stress. Examples include:  Losing a job or starting a new job.  Losing a loved one.  Moving to a new town or home.  Getting married or divorced.  Having a baby.  Injury or illness. Less obvious life events can also cause stress, especially if they occur day after day or in combination with each other. Examples include:  Working long hours.  Driving in traffic.  Caring for children.  Being in debt.  Being in a difficult relationship. What are the signs or symptoms? Stress can cause emotional symptoms, including:  Anxiety. This is feeling worried, afraid, on edge, overwhelmed, or out of control.  Anger, including irritation or impatience.  Depression. This is feeling sad, down, helpless, or guilty.  Trouble focusing, remembering, or making decisions. Stress can cause physical symptoms, including:  Aches and pains. These may affect your head, neck, back, stomach, or other areas of your body.  Tight muscles or a clenched jaw.  Low  energy.  Trouble sleeping. Stress can cause unhealthy behaviors, including:  Eating to feel better (overeating) or skipping meals.  Working too much or putting off tasks.  Smoking, drinking alcohol, or using drugs to feel better. How is this diagnosed? Stress is diagnosed through an assessment by your health care provider. He or she may diagnose this condition based on:  Your symptoms and any stressful life events.  Your medical history.  Tests to rule out other causes of your symptoms. Depending on your condition, your health care provider may refer you to a specialist for further evaluation. How is this treated?  Stress management techniques are the recommended treatment for stress. Medicine is not typically recommended for the treatment of stress. Techniques to reduce your reaction to stressful life events include:  Stress identification. Monitor yourself for symptoms of stress and identify what causes stress for you. These skills may help you to avoid or prepare for stressful events.  Time management. Set your priorities, keep a calendar of events, and learn to say "no." Taking these actions can help you avoid making too many commitments. Techniques for coping with stress include:  Rethinking the problem. Try to think  realistically about stressful events rather than ignoring them or overreacting. Try to find the positives in a stressful situation rather than focusing on the negatives.  Exercise. Physical exercise can release both physical and emotional tension. The key is to find a form of exercise that you enjoy and do it regularly.  Relaxation techniques. These relax the body and mind. The key is to find one or more that you enjoy and use the technique(s) regularly. Examples include: ? Meditation, deep breathing, or progressive relaxation techniques. ? Yoga or tai chi. ? Biofeedback, mindfulness techniques, or journaling. ? Listening to music, being out in nature, or  participating in other hobbies.  Practicing a healthy lifestyle. Eat a balanced diet, drink plenty of water, limit or avoid caffeine, and get plenty of sleep.  Having a strong support network. Spend time with family, friends, or other people you enjoy being around. Express your feelings and talk things over with someone you trust. Counseling or talk therapy with a mental health professional may be helpful if you are having trouble managing stress on your own. Follow these instructions at home: Lifestyle   Avoid drugs.  Do not use any products that contain nicotine or tobacco, such as cigarettes and e-cigarettes. If you need help quitting, ask your health care provider.  Limit alcohol intake to no more than 1 drink a day for nonpregnant women and 2 drinks a day for men. One drink equals 12 oz of beer, 5 oz of wine, or 1 oz of hard liquor.  Do not use alcohol or drugs to relax.  Eat a balanced diet that includes fresh fruits and vegetables, whole grains, lean meats, fish, eggs, and beans, and low-fat dairy. Avoid processed foods and foods high in added fat, sugar, and salt.  Exercise at least 30 minutes on 5 or more days each week.  Get 7-8 hours of sleep each night. General instructions   Practice stress management techniques as discussed with your health care provider.  Drink enough fluid to keep your urine clear or pale yellow.  Take over-the-counter and prescription medicines only as told by your health care provider.  Keep all follow-up visits as told by your health care provider. This is important. Contact a health care provider if:  Your symptoms get worse.  You have new symptoms.  You feel overwhelmed by your problems and can no longer manage them on your own. Get help right away if:  You have thoughts of hurting yourself or others. If you ever feel like you may hurt yourself or others, or have thoughts about taking your own life, get help right away. You can go to  your nearest emergency department or call:  Your local emergency services (911 in the [place]  A suicide crisis helpline, such as the [facility] at (973)458-7562. This is open 24 hours a day. Summary  Stress is a normal reaction to life events. It can cause problems if it happens too often or for too long.  Practicing stress management techniques is the best way to treat stress.  Counseling or talk therapy with a mental health professional may be helpful if you are having trouble managing stress on your own. This information is not intended to replace advice given to you by your health care provider. Make sure you discuss any questions you have with your health care provider. Document Released: 04/11/2001 Document Revised: 09/28/2017 Document Reviewed: 12/06/2016 Elsevier Patient Education  2020 [place]  Years and Older, Female Preventive care refers to lifestyle choices and visits with your health care provider that can promote health and wellness. This includes:  A yearly physical exam. This is also called an annual well check.  Regular dental and eye exams.  Immunizations.  Screening for certain conditions.  Healthy lifestyle choices, such as diet and exercise. What can I expect for my preventive care visit? Physical exam Your health care provider will check:  Height and weight. These may be used to calculate body mass index (BMI), which is a measurement that tells if you are at a healthy weight.  Heart rate and blood pressure.  Your skin for abnormal spots. Counseling Your health care provider may ask you questions about:  Alcohol, tobacco, and drug use.  Emotional well-being.  Home and relationship well-being.  Sexual activity.  Eating habits.  History of falls.  Memory and ability to understand (cognition).  Work and work Statistician.  Pregnancy and menstrual history. What immunizations do I  need?  Influenza (flu) vaccine  This is recommended every year. Tetanus, diphtheria, and pertussis (Tdap) vaccine  You may need a Td booster every 10 years. Varicella (chickenpox) vaccine  You may need this vaccine if you have not already been vaccinated. Zoster (shingles) vaccine  You may need this after age 76. Pneumococcal conjugate (PCV13) vaccine  One dose is recommended after age 33. Pneumococcal polysaccharide (PPSV23) vaccine  One dose is recommended after age 16. Measles, mumps, and rubella (MMR) vaccine  You may need at least one dose of MMR if you were born in 1957 or later. You may also need a second dose. Meningococcal conjugate (MenACWY) vaccine  You may need this if you have certain conditions. Hepatitis A vaccine  You may need this if you have certain conditions or if you travel or work in places where you may be exposed to hepatitis A. Hepatitis B vaccine  You may need this if you have certain conditions or if you travel or work in places where you may be exposed to hepatitis B. Haemophilus influenzae type b (Hib) vaccine  You may need this if you have certain conditions. You may receive vaccines as individual doses or as more than one vaccine together in one shot (combination vaccines). Talk with your health care provider about the risks and benefits of combination vaccines. What tests do I need? Blood tests  Lipid and cholesterol levels. These may be checked every 5 years, or more frequently depending on your overall health.  Hepatitis C test.  Hepatitis B test. Screening  Lung cancer screening. You may have this screening every year starting at age 75 if you have a 30-pack-year history of smoking and currently smoke or have quit within the past 15 years.  Colorectal cancer screening. All adults should have this screening starting at age 88 and continuing until age 74. Your health care provider may recommend screening at age 49 if you are at  increased risk. You will have tests every 1-10 years, depending on your results and the type of screening test.  Diabetes screening. This is done by checking your blood sugar (glucose) after you have not eaten for a while (fasting). You may have this done every 1-3 years.  Mammogram. This may be done every 1-2 years. Talk with your health care provider about how often you should have regular mammograms.  BRCA-related cancer screening. This may be done if you have a family history of breast, ovarian, tubal, or peritoneal cancers. Other tests  Sexually transmitted disease (STD) testing.  Bone density scan. This is done to screen for osteoporosis. You may have this done starting at age 60. Follow these instructions at home: Eating and drinking  Eat a diet that includes fresh fruits and vegetables, whole grains, lean protein, and low-fat dairy products. Limit your intake of foods with high amounts of sugar, saturated fats, and salt.  Take vitamin and mineral supplements as recommended by your health care provider.  Do not drink alcohol if your health care provider tells you not to drink.  If you drink alcohol: ? Limit how much you have to 0-1 drink a day. ? Be aware of how much alcohol is in your drink. In the [place] one drink equals one 12 oz bottle of beer (355 mL), one 5 oz glass of wine (148 mL), or one 1 oz glass of hard liquor (44 mL). Lifestyle  Take daily care of your teeth and gums.  Stay active. Exercise for at least 30 minutes on 5 or more days each week.  Do not use any products that contain nicotine or tobacco, such as cigarettes, e-cigarettes, and chewing tobacco. If you need help quitting, ask your health care provider.  If you are sexually active, practice safe sex. Use a condom or other form of protection in order to prevent STIs (sexually transmitted infections).  Talk with your health care provider about taking a low-dose aspirin or statin. What's next?  Go to your  health care provider once a year for a well check visit.  Ask your health care provider how often you should have your eyes and teeth checked.  Stay up to date on all vaccines. This information is not intended to replace advice given to you by your health care provider. Make sure you discuss any questions you have with your health care provider. Document Released: 11/12/2015 Document Revised: 10/10/2018 Document Reviewed: 10/10/2018 Elsevier Patient Education  [employer].      If you have lab work done today you will be contacted with your lab results within the next 2 weeks.  If you have not heard from [place] then please contact us. The fastest way to get your results is to register for My Chart.   IF you received an x-ray today, you will receive an invoice from [facility]. Please contact [facility] at [phone] with questions or concerns regarding your invoice.   IF you received labwork today, you will receive an invoice from [facility]. Please contact [facility] at [phone] with questions or concerns regarding your invoice.   Our billing staff will not be able to assist you with questions regarding bills from these companies.  You will be contacted with the lab results as soon as they are available. The fastest way to get your results is to activate your My Chart account. Instructions are located on the last page of this paperwork. If you have not heard from [place] regarding the results in 2 weeks, please contact this office.

## 2019-06-05 NOTE — Progress Notes (Signed)
Subjective:    Patient ID: Toni Williams, female    DOB: 1951-02-12, 68 y.o.   MRN: [id number]  HPI Toni Williams is a 68 y.o. female Presents today for: Chief Complaint  Patient presents with   Annual Exam    here today for annual physical with other issus. Patient had cbc and cmp done on 05/22/19 at [facility]   chest flutter    No chest pain but have been having flutters on and off. None in the last 2 days usually hsve them once or twice a day   Arm Pain    soreness and stiffness in the right arm   Here for annual exam but other concerns as above.  History of hypertension, eczema, remote history of breast cancer.   Derm: Cletis Athens, OBGYN: Mancel Bale.   Hypertension: BP Readings from Last 3 Encounters:  06/05/19 140/80  07/04/18 122/76  06/07/18 134/82   Lab Results  Component Value Date   CREATININE 0.74 07/10/2017  HCTZ 25 mg daily. No new side effects of meds. No home readings.  Creatinine 0.75 on 7/23, nl Na, K at that time.   Eczema  - dermatology Dr. Leona Singleton, [facility]  - mtx 2.67m q week.   Palpitations:  Reports heart fluttering sensation, not currently but last experience a few days ago.  fleeting sensation in chest like quick sensation, no pain. Past 1 week.  Some increased stressors - dtr just moved, working form home.  No associated dyspnea/n/v/sweating. No radiating pain.  No symptoms past 2 days.  1 caffeinated drink per day.   EKG obtained in office, previous comparison EKG was from 2013, note at that time states refer to cardiology. Cardiology eval in July 2013 with Dr. NJohnsie Cancel  Left bundle branch block thought to be chronic at that time secondary to hypertension. She had a normal Myoview study July 2013 without evidence of ischemia or infarction, and echocardiogram with EF 55 to 65% in July 2013.  Normal wall motion.  No regional wall motion normalities.  R arm soreness. Past week, but improving. No limitation in mvmt.  Tx:  none.  Hyperglycemia: Glucose nl at 81 on 7/23 with derm.  Normal lft;s at that time.  Elevated A1c in 06/2018 - plan for 3 month recheck. Has not had repeated.  No increased thirst,polyuria, or blurry vision.   Fall screen  - none in past year. No loose rugs. 1 flight of carpeted stairs,    Dep[facility] 06/05/2019 07/04/2018 06/07/2018 07/10/2017 02/09/2017  Decreased Interest 0 0 0 0 0  Down, Depressed, Hopeless 0 0 0 0 0  PHQ - 2 Score 0 0 0 0 0   Immunization History  Administered Date(s) Administered   Td 01/18/2005  shingles: declines for now.  Pneumonia: declines for now.   Cancer screen: Colon: 10 years up in March - plans on scheduling.  Mammogram 7/2  Functional Status Survey: Is the patient deaf or have difficulty hearing?: No Does the patient have difficulty seeing, even when wearing glasses/contacts?: No Does the patient have difficulty concentrating, remembering, or making decisions?: No Does the patient have difficulty walking or climbing stairs?: No Does the patient have difficulty dressing or bathing?: No Does the patient have difficulty doing errands alone such as visiting a doctor's office or shopping?: No   6CIT Screen 06/05/2019  What Year? 0 points  What month? 0 points  What time? 0 points  Count back from 20 0 points  Months in reverse  0 points  Repeat phrase 0 points  Total Score 0      Hearing Screening   125Hz  250Hz  500Hz  1000Hz  2000Hz  3000Hz  4000Hz  6000Hz  8000Hz   Right ear:           Left ear:             Visual Acuity Screening   Right eye Left eye Both eyes  Without correction: 20/20 20/20 20/20   With correction:      optho - Dr. Luretha Rued   Dental: looking for new one.   Exercise:  1 day per week lately. Prior on Zumba, exercise classes.   Alcohol: none.   Tobacco: none.   Advanced directives: no living will.   Allergies:  flonase or astelin as needed. flonase when stuffy astelin when difficulty.    Hyperlipidemia:  Lab Results  Component Value Date   CHOL 202 (H) 07/04/2018   HDL 60 07/04/2018   LDLCALC 118 (H) 07/04/2018   TRIG 121 07/04/2018   CHOLHDL 3.4 07/04/2018   Lab Results  Component Value Date   ALT 19 10/10/2014   AST 20 10/10/2014   ALKPHOS 92 10/10/2014   BILITOT 0.8 10/10/2014  Mild elevation in September 2019, has not been checked recently.  No current statin.     Patient Active Problem List   Diagnosis Date Noted   Chest pain 05/17/2012   Abnormal resting ECG findings 04/24/2012   Vitamin D deficiency 04/24/2012   Hypertension 02/11/2012   Breast cancer ([place]) 02/11/2012   Eczema 02/11/2012   Allergic rhinitis due to allergen 02/11/2012   Past Medical History:  Diagnosis Date   Anxiety    no meds   Blood transfusion 1979   with missed abortion in [place] -Dr Heber Tolley   Cancer [facility])    Hypertension    Missed abortion    x 1   Seasonal allergies    Termination of pregnancy    x 1   Past Surgical History:  Procedure Laterality Date   BREAST SURGERY  10/03/2007, 11/06/2007   Left breast excision, re-excision Left breast   BREAST SURGERY     No IV sticks/Blood Draws/Blood Pressure Left Arm,   CESAREAN SECTION     CHOLECYSTECTOMY     colonscopy     DILATION AND CURETTAGE OF UTERUS     HYSTEROSCOPY W/D&C  10/27/2011   Procedure: DILATATION AND CURETTAGE /HYSTEROSCOPY;  Surgeon: Delice Lesch, MD;  Location: [facility];  Service: Gynecology;  Laterality: N/A;   WISDOM TOOTH EXTRACTION     Allergies  Allergen Reactions   Morphine And Related Other (See Comments)    "psychotic" reaction per pt report   Shellfish Allergy Swelling   Latex Rash    itching   Prior to Admission medications   Medication Sig Start Date End Date Taking? Authorizing Provider  azelastine (ASTELIN) 0.1 % nasal spray Place 1 spray into both nostrils 2 (two) times daily. Use in each nostril as directed 07/10/17  Yes Wendie Agreste, MD   cholecalciferol (VITAMIN D) 1000 UNITS tablet Take 2,000 Units by mouth daily.     Yes [provider]  hydrochlorothiazide (HYDRODIURIL) 25 MG tablet TAKE 1 TABLET BY MOUTH EVERY DAY 05/01/19  Yes Wendie Agreste, MD  methotrexate 2.5 MG tablet Take by mouth 3 (three) times a week.   Yes [provider]   Social History   Socioeconomic History   Marital status: Married    Spouse name: Not on file  Number of children: Not on file   Years of education: Not on file   Highest education level: Not on file  Occupational History   Not on file  Social Needs   Financial resource strain: Not on file   Food insecurity    Worry: Not on file    Inability: Not on file   Transportation needs    Medical: Not on file    Non-medical: Not on file  Tobacco Use   Smoking status: Former Smoker    Packs/day: 0.25    Years: 2.00    Pack years: 0.50    Types: Cigarettes    Quit date: 10/30/1976    Years since quitting: 42.6   Smokeless tobacco: Never Used  Substance and Sexual Activity   Alcohol use: No   Drug use: No   Sexual activity: Not Currently    Birth control/protection: None  Lifestyle   Physical activity    Days per week: Not on file    Minutes per session: Not on file   Stress: Not on file  Relationships   Social connections    Talks on phone: Not on file    Gets together: Not on file    Attends religious service: Not on file    Active member of club or organization: Not on file    Attends meetings of clubs or organizations: Not on file    Relationship status: Not on file   Intimate partner violence    Fear of current or ex partner: Not on file    Emotionally abused: Not on file    Physically abused: Not on file    Forced sexual activity: Not on file  Other Topics Concern   Not on file  Social History Narrative   Not on file    Review of Systems 13 point review of systems per patient health survey noted.  Negative other than as  indicated above or in HPI.      Objective:   Physical Exam Vitals signs reviewed.  Constitutional:      Appearance: She is well-developed.  HENT:     Head: Normocephalic and atraumatic.     Right Ear: External ear normal.     Left Ear: External ear normal.  Eyes:     Conjunctiva/sclera: Conjunctivae normal.     Pupils: Pupils are equal, round, and reactive to light.  Neck:     Musculoskeletal: Normal range of motion and neck supple.     Thyroid: No thyromegaly.  Cardiovascular:     Rate and Rhythm: Regular rhythm. Tachycardia present.     Heart sounds: Normal heart sounds. No murmur. No gallop.   Pulmonary:     Effort: Pulmonary effort is normal. No respiratory distress.     Breath sounds: Normal breath sounds. No wheezing or rales.  Abdominal:     General: Bowel sounds are normal.     Palpations: Abdomen is soft.     Tenderness: There is no abdominal tenderness.  Musculoskeletal: Normal range of motion.        General: No tenderness.  Lymphadenopathy:     Cervical: No cervical adenopathy.  Skin:    General: Skin is warm and dry.     Findings: No rash.  Neurological:     Mental Status: She is alert and oriented to person, place, and time.  Psychiatric:        Behavior: Behavior normal.        Thought Content: Thought content normal.  Vitals:   06/05/19 1425  BP: 140/80  Pulse: (!) 108  Resp: 14  Temp: 98.1 F (36.7 C)  TempSrc: Oral  SpO2: 98%  Weight: 192 lb (87.1 kg)     EKG: Rate 91, sinus rhythm, left bundle branch block.  Upward T wave compared to lead III 2013 when flat, downward QRS in V3 compared to 2013, no other apparent changes from 2013.     Assessment & Plan:    Toni Williams is a 68 y.o. female Medicare annual wellness visit, subsequent - Plan:   -- anticipatory guidance as below in AVS, screening labs if needed. Health maintenance items as above in HPI discussed/recommended as applicable.  - no concerning responses on depression,  fall, or functional status screening. Any positive responses noted as above. Advanced directives discussed as in CHL.   -Vaccines recommended as above but declined.  Link to [facility] for further information review.   Allergic rhinitis, unspecified seasonality, unspecified trigger - Plan: azelastine (ASTELIN) 0.1 % nasal spray,  Nasal congestion - Plan: fluticasone (FLONASE) 50 MCG/ACT nasal spray, azelastine (ASTELIN) 0.1 % nasal spray,   -Recommended trial of either Flonase or Astelin independently, possibly Flonase first due to drying effect of Astelin.  Both refilled if needed.  Essential hypertension - Plan: hydrochlorothiazide (HYDRODIURIL) 25 MG tablet,   - Stable, tolerating current regimen. Medications refilled. Labs pending as above.    Intermittent palpitations - Plan: EKG 12-Lead, Ambulatory referral to Cardiology, TSH,   -No acute findings on EKG, overall reassuring history but will have her follow-up with cardiology, check TSH.  ER precautions/911 precautions given  -Possible stress contributed.  Handout given on stress management, follow-up in 1 month to check on status, sooner if worsening  Hyperglycemia - Plan: Hemoglobin A1c,   -Diabetes screen/prediabetes screen  Hyperlipidemia, unspecified hyperlipidemia type - Plan: Lipid Panel,  -Mild elevation previously, repeat labs to determine statin need.  Right shoulder/arm soreness as above but has been improving, reassuring exam.  RTC precautions if symptoms return/worsen  Meds ordered this encounter  Medications   fluticasone (FLONASE) 50 MCG/ACT nasal spray    Sig: Place 1-2 sprays into both nostrils daily.    Dispense:  16 g    Refill:  5   azelastine (ASTELIN) 0.1 % nasal spray    Sig: Place 1 spray into both nostrils 2 (two) times daily. Use in each nostril as directed    Dispense:  30 mL    Refill:  11   hydrochlorothiazide (HYDRODIURIL) 25 MG tablet    Sig: Take 1 tablet (25 mg total) by mouth daily.    Dispense:   90 tablet    Refill:  1   Patient Instructions   I will refer you to cardiology to discuss the chest symptoms further. If those worsen, go to emergency room or call 911.   Info on vaccines below. Let me know if you change your mind and I can order them.  [email or web address]  See information below on stress, but I would like to follow-up with you in 1 month to review the symptoms and lab work further at that time if needed.  As shoulder pain is improving, I expect that to continue to get better.  If any increased pain, or limitation in range of motion or strength, be seen right away.  Flonase or Astelin nasal spray can be used but typically should not need both.  I would start with Flonase first as that can be less drying.  Can  discuss that further in the next month as well.  Return to the clinic or go to the nearest emergency room if any of your symptoms worsen or new symptoms occur.   Palpitations Palpitations are feelings that your heartbeat is irregular or is faster than normal. It may feel like your heart is fluttering or skipping a beat. Palpitations are usually not a serious problem. They may be caused by many things, including smoking, caffeine, alcohol, stress, and certain medicines or drugs. Most causes of palpitations are not serious. However, some palpitations can be a sign of a serious problem. You may need further tests to rule out serious medical problems. Follow these instructions at home:     Pay attention to any changes in your condition. Take these actions to help manage your symptoms: Eating and drinking  Avoid foods and drinks that may cause palpitations. These may include: ? Caffeinated coffee, tea, soft drinks, diet pills, and energy drinks. ? Chocolate. ? Alcohol. Lifestyle  Take steps to reduce your stress and anxiety. Things that can help you relax include: ? Yoga. ? Mind-body activities, such as deep breathing, meditation, or  using words and images to create positive thoughts (guided imagery). ? Physical activity, such as swimming, jogging, or walking. Tell your health care provider if your palpitations increase with activity. If you have chest pain or shortness of breath with activity, do not continue the activity until you are seen by your health care provider. ? Biofeedback. This is a method that helps you learn to use your mind to control things in your body, such as your heartbeat.  Do not use drugs, including cocaine or ecstasy. Do not use marijuana.  Get plenty of rest and sleep. Keep a regular bed time. General instructions  Take over-the-counter and prescription medicines only as told by your health care provider.  Do not use any products that contain nicotine or tobacco, such as cigarettes and e-cigarettes. If you need help quitting, ask your health care provider.  Keep all follow-up visits as told by your health care provider. This is important. These may include visits for further testing if palpitations do not go away or get worse. Contact a health care provider if you:  Continue to have a fast or irregular heartbeat after 24 hours.  Notice that your palpitations occur more often. Get help right away if you:  Have chest pain or shortness of breath.  Have a severe headache.  Feel dizzy or you faint. Summary  Palpitations are feelings that your heartbeat is irregular or is faster than normal. It may feel like your heart is fluttering or skipping a beat.  Palpitations may be caused by many things, including smoking, caffeine, alcohol, stress, certain medicines, and drugs.  Although most causes of palpitations are not serious, some causes can be a sign of a serious medical problem.  Get help right away if you faint or have chest pain, shortness of breath, a severe headache, or dizziness. This information is not intended to replace advice given to you by your health care provider. Make sure you  discuss any questions you have with your health care provider. Document Released: 10/13/2000 Document Revised: 11/28/2017 Document Reviewed: 11/28/2017 Elsevier Patient Education  2020 [place] is a normal reaction to life events. Stress is what you feel when life demands more than you are used to, or more than you think you can handle. Some stress can be useful, such as studying for a test  or meeting a deadline at work. Stress that occurs too often or for too long can cause problems. It can affect your emotional health and interfere with relationships and normal daily activities. Too much stress can weaken your body's defense system (immune system) and increase your risk for physical illness. If you already have a medical problem, stress can make it worse. What are the causes? All sorts of life events can cause stress. An event that causes stress for one person may not be stressful for another person. Major life events, whether positive or negative, commonly cause stress. Examples include:  Losing a job or starting a new job.  Losing a loved one.  Moving to a new town or home.  Getting married or divorced.  Having a baby.  Injury or illness. Less obvious life events can also cause stress, especially if they occur day after day or in combination with each other. Examples include:  Working long hours.  Driving in traffic.  Caring for children.  Being in debt.  Being in a difficult relationship. What are the signs or symptoms? Stress can cause emotional symptoms, including:  Anxiety. This is feeling worried, afraid, on edge, overwhelmed, or out of control.  Anger, including irritation or impatience.  Depression. This is feeling sad, down, helpless, or guilty.  Trouble focusing, remembering, or making decisions. Stress can cause physical symptoms, including:  Aches and pains. These may affect your head, neck, back, stomach, or other areas of your  body.  Tight muscles or a clenched jaw.  Low energy.  Trouble sleeping. Stress can cause unhealthy behaviors, including:  Eating to feel better (overeating) or skipping meals.  Working too much or putting off tasks.  Smoking, drinking alcohol, or using drugs to feel better. How is this diagnosed? Stress is diagnosed through an assessment by your health care provider. He or she may diagnose this condition based on:  Your symptoms and any stressful life events.  Your medical history.  Tests to rule out other causes of your symptoms. Depending on your condition, your health care provider may refer you to a specialist for further evaluation. How is this treated?  Stress management techniques are the recommended treatment for stress. Medicine is not typically recommended for the treatment of stress. Techniques to reduce your reaction to stressful life events include:  Stress identification. Monitor yourself for symptoms of stress and identify what causes stress for you. These skills may help you to avoid or prepare for stressful events.  Time management. Set your priorities, keep a calendar of events, and learn to say no. Taking these actions can help you avoid making too many commitments. Techniques for coping with stress include:  Rethinking the problem. Try to think realistically about stressful events rather than ignoring them or overreacting. Try to find the positives in a stressful situation rather than focusing on the negatives.  Exercise. Physical exercise can release both physical and emotional tension. The key is to find a form of exercise that you enjoy and do it regularly.  Relaxation techniques. These relax the body and mind. The key is to find one or more that you enjoy and use the technique(s) regularly. Examples include: ? Meditation, deep breathing, or progressive relaxation techniques. ? Yoga or tai chi. ? Biofeedback, mindfulness techniques, or  journaling. ? Listening to music, being out in nature, or participating in other hobbies.  Practicing a healthy lifestyle. Eat a balanced diet, drink plenty of water, limit or avoid caffeine, and get plenty of sleep.  Having a strong support network. Spend time with family, friends, or other people you enjoy being around. Express your feelings and talk things over with someone you trust. Counseling or talk therapy with a mental health professional may be helpful if you are having trouble managing stress on your own. Follow these instructions at home: Lifestyle   Avoid drugs.  Do not use any products that contain nicotine or tobacco, such as cigarettes and e-cigarettes. If you need help quitting, ask your health care provider.  Limit alcohol intake to no more than 1 drink a day for nonpregnant women and 2 drinks a day for men. One drink equals 12 oz of beer, 5 oz of wine, or 1 oz of hard liquor.  Do not use alcohol or drugs to relax.  Eat a balanced diet that includes fresh fruits and vegetables, whole grains, lean meats, fish, eggs, and beans, and low-fat dairy. Avoid processed foods and foods high in added fat, sugar, and salt.  Exercise at least 30 minutes on 5 or more days each week.  Get 7-8 hours of sleep each night. General instructions   Practice stress management techniques as discussed with your health care provider.  Drink enough fluid to keep your urine clear or pale yellow.  Take over-the-counter and prescription medicines only as told by your health care provider.  Keep all follow-up visits as told by your health care provider. This is important. Contact a health care provider if:  Your symptoms get worse.  You have new symptoms.  You feel overwhelmed by your problems and can no longer manage them on your own. Get help right away if:  You have thoughts of hurting yourself or others. If you ever feel like you may hurt yourself or others, or have thoughts about  taking your own life, get help right away. You can go to your nearest emergency department or call:  Your local emergency services (911 in the [place]  A suicide crisis helpline, such as the [facility] at (819)091-9027. This is open 24 hours a day. Summary  Stress is a normal reaction to life events. It can cause problems if it happens too often or for too long.  Practicing stress management techniques is the best way to treat stress.  Counseling or talk therapy with a mental health professional may be helpful if you are having trouble managing stress on your own. This information is not intended to replace advice given to you by your health care provider. Make sure you discuss any questions you have with your health care provider. Document Released: 04/11/2001 Document Revised: 09/28/2017 Document Reviewed: 12/06/2016 Elsevier Patient Education  2020 [place] 65 Years and Older, Female Preventive care refers to lifestyle choices and visits with your health care provider that can promote health and wellness. This includes:  A yearly physical exam. This is also called an annual well check.  Regular dental and eye exams.  Immunizations.  Screening for certain conditions.  Healthy lifestyle choices, such as diet and exercise. What can I expect for my preventive care visit? Physical exam Your health care provider will check:  Height and weight. These may be used to calculate body mass index (BMI), which is a measurement that tells if you are at a healthy weight.  Heart rate and blood pressure.  Your skin for abnormal spots. Counseling Your health care provider may ask you questions about:  Alcohol, tobacco, and drug use.  Emotional well-being.  Home and relationship well-being.  Sexual activity.  Eating habits.  History of falls.  Memory and ability to understand (cognition).  Work and work  Statistician.  Pregnancy and menstrual history. What immunizations do I need?  Influenza (flu) vaccine  This is recommended every year. Tetanus, diphtheria, and pertussis (Tdap) vaccine  You may need a Td booster every 10 years. Varicella (chickenpox) vaccine  You may need this vaccine if you have not already been vaccinated. Zoster (shingles) vaccine  You may need this after age 10. Pneumococcal conjugate (PCV13) vaccine  One dose is recommended after age 73. Pneumococcal polysaccharide (PPSV23) vaccine  One dose is recommended after age 55. Measles, mumps, and rubella (MMR) vaccine  You may need at least one dose of MMR if you were born in 1957 or later. You may also need a second dose. Meningococcal conjugate (MenACWY) vaccine  You may need this if you have certain conditions. Hepatitis A vaccine  You may need this if you have certain conditions or if you travel or work in places where you may be exposed to hepatitis A. Hepatitis B vaccine  You may need this if you have certain conditions or if you travel or work in places where you may be exposed to hepatitis B. Haemophilus influenzae type b (Hib) vaccine  You may need this if you have certain conditions. You may receive vaccines as individual doses or as more than one vaccine together in one shot (combination vaccines). Talk with your health care provider about the risks and benefits of combination vaccines. What tests do I need? Blood tests  Lipid and cholesterol levels. These may be checked every 5 years, or more frequently depending on your overall health.  Hepatitis C test.  Hepatitis B test. Screening  Lung cancer screening. You may have this screening every year starting at age 74 if you have a 30-pack-year history of smoking and currently smoke or have quit within the past 15 years.  Colorectal cancer screening. All adults should have this screening starting at age 85 and continuing until age 34. Your  health care provider may recommend screening at age 104 if you are at increased risk. You will have tests every 1-10 years, depending on your results and the type of screening test.  Diabetes screening. This is done by checking your blood sugar (glucose) after you have not eaten for a while (fasting). You may have this done every 1-3 years.  Mammogram. This may be done every 1-2 years. Talk with your health care provider about how often you should have regular mammograms.  BRCA-related cancer screening. This may be done if you have a family history of breast, ovarian, tubal, or peritoneal cancers. Other tests  Sexually transmitted disease (STD) testing.  Bone density scan. This is done to screen for osteoporosis. You may have this done starting at age 26. Follow these instructions at home: Eating and drinking  Eat a diet that includes fresh fruits and vegetables, whole grains, lean protein, and low-fat dairy products. Limit your intake of foods with high amounts of sugar, saturated fats, and salt.  Take vitamin and mineral supplements as recommended by your health care provider.  Do not drink alcohol if your health care provider tells you not to drink.  If you drink alcohol: ? Limit how much you have to 0-1 drink a day. ? Be aware of how much alcohol is in your drink. In the [place] one drink equals one 12 oz bottle of beer (355 mL), one  5 oz glass of wine (148 mL), or one 1 oz glass of hard liquor (44 mL). Lifestyle  Take daily care of your teeth and gums.  Stay active. Exercise for at least 30 minutes on 5 or more days each week.  Do not use any products that contain nicotine or tobacco, such as cigarettes, e-cigarettes, and chewing tobacco. If you need help quitting, ask your health care provider.  If you are sexually active, practice safe sex. Use a condom or other form of protection in order to prevent STIs (sexually transmitted infections).  Talk with your health care provider  about taking a low-dose aspirin or statin. What's next?  Go to your health care provider once a year for a well check visit.  Ask your health care provider how often you should have your eyes and teeth checked.  Stay up to date on all vaccines. This information is not intended to replace advice given to you by your health care provider. Make sure you discuss any questions you have with your health care provider. Document Released: 11/12/2015 Document Revised: 10/10/2018 Document Reviewed: 10/10/2018 Elsevier Patient Education  [employer].      If you have lab work done today you will be contacted with your lab results within the next 2 weeks.  If you have not heard from [place] then please contact us. The fastest way to get your results is to register for My Chart.   IF you received an x-ray today, you will receive an invoice from [facility]. Please contact [facility] at [phone] with questions or concerns regarding your invoice.   IF you received labwork today, you will receive an invoice from [facility]. Please contact [facility] at [phone] with questions or concerns regarding your invoice.   Our billing staff will not be able to assist you with questions regarding bills from these companies.  You will be contacted with the lab results as soon as they are available. The fastest way to get your results is to activate your My Chart account. Instructions are located on the last page of this paperwork. If you have not heard from [place] regarding the results in 2 weeks, please contact this office.       Signed,   Merri Ray, MD [facility].  06/07/19 11:41 AM

## 2019-06-06 LAB — LIPID PANEL
Chol/HDL Ratio: 3.5 ratio (ref 0.0–4.4)
Cholesterol, Total: 205 mg/dL — ABNORMAL HIGH (ref 100–199)
HDL: 58 mg/dL (ref >39)
LDL Calculated: 123 mg/dL — ABNORMAL HIGH (ref 0–99)
Triglycerides: 119 mg/dL (ref 0–149)
VLDL Cholesterol Cal: 24 mg/dL (ref 5–40)

## 2019-06-06 LAB — HEMOGLOBIN A1C
Est. average glucose Bld gHb Est-mCnc: 134 mg/dL
Hgb A1c MFr Bld: 6.3 % — ABNORMAL HIGH (ref 4.8–5.6)

## 2019-06-06 LAB — TSH: TSH: 1.78 u[IU]/mL (ref 0.450–4.500)

## 2019-06-07 ENCOUNTER — Encounter: Payer: Self-pay | Admitting: Family Medicine

## 2019-08-18 ENCOUNTER — Encounter: Payer: Self-pay | Admitting: Cardiology

## 2019-08-18 ENCOUNTER — Other Ambulatory Visit: Payer: Self-pay

## 2019-08-18 ENCOUNTER — Ambulatory Visit: Payer: Medicare Other | Admitting: Cardiology

## 2019-08-18 VITALS — BP 140/80 | HR 98 | Ht 64.0 in | Wt 192.6 lb

## 2019-08-18 DIAGNOSIS — R002 Palpitations: Secondary | ICD-10-CM | POA: Diagnosis not present

## 2019-08-18 DIAGNOSIS — I447 Left bundle-branch block, unspecified: Secondary | ICD-10-CM

## 2019-08-18 DIAGNOSIS — I10 Essential (primary) hypertension: Secondary | ICD-10-CM

## 2019-08-18 NOTE — Progress Notes (Signed)
Cardiology Office Note    Date:  08/18/2019   ID:  [name], [name] [birth year], MRN [id number]  PCP:  Wendie Agreste, MD  Cardiologist:  Fransico Him, MD   Chief Complaint  Patient presents with  . Palpitations    History of Present Illness:  Toni Williams is a 68 y.o. female who is being seen today for the evaluation of palpitations at the request of Wendie Agreste, MD.  This is a 68yo female with a hx of HTN who recently was seen for routine PCP visit and mentioned that she was having palpitations.  She describes it as a fluttering sensation.  She has had some stress at home but denies any associated CP, SOB, dizziness or syncope.  She does drink 1 cup of coffee daily and 1 glass of ice tea.  EKG in PCP office stable.  She also has had some pain in her right arm associated with certain movements of her right arm like laying on her arm or dangling her arm.  This has resolved since seeing her PCP.  She has a chronic LBBB noted back in 2013 and Myoview at that time was normal and 2D echo showed normal LVF.  She denies any exertional CP, SOB, PND, orthopnea, LE edema, dizziness or syncope.    Past Medical History:  Diagnosis Date  . Anxiety    no meds  . Blood transfusion 1979   with missed abortion in [facility] [place]  . Cancer ([place])   . Hypertension   . Missed abortion    x 1  . Seasonal allergies   . Termination of pregnancy    x 1    Past Surgical History:  Procedure Laterality Date  . BREAST SURGERY  10/03/2007, 11/06/2007   Left breast excision, re-excision Left breast  . BREAST SURGERY     No IV sticks/Blood Draws/Blood Pressure Left Arm,  . CESAREAN SECTION    . CHOLECYSTECTOMY    . colonscopy    . DILATION AND CURETTAGE OF UTERUS    . HYSTEROSCOPY W/D&C  10/27/2011   Procedure: DILATATION AND CURETTAGE /HYSTEROSCOPY;  Surgeon: Delice Lesch, MD;  Location: [facility];  Service: Gynecology;  Laterality: N/A;  . WISDOM TOOTH EXTRACTION       Current Medications: Current Meds  Medication Sig  . azelastine (ASTELIN) 0.1 % nasal spray Place 1 spray into both nostrils 2 (two) times daily. Use in each nostril as directed  . cholecalciferol (VITAMIN D) 1000 UNITS tablet Take 2,000 Units by mouth daily.    . fluticasone (FLONASE) 50 MCG/ACT nasal spray Place 1-2 sprays into both nostrils daily.  . hydrochlorothiazide (HYDRODIURIL) 25 MG tablet Take 1 tablet (25 mg total) by mouth daily.  . methotrexate 2.5 MG tablet Take 2.5 mg by mouth once a week.     Allergies:   Morphine and related, Shellfish allergy, and Latex   Social History   Socioeconomic History  . Marital status: Married    Spouse name: Not on file  . Number of children: Not on file  . Years of education: Not on file  . Highest education level: Not on file  Occupational History  . Not on file  Social Needs  . Financial resource strain: Not on file  . Food insecurity    Worry: Not on file    Inability: Not on file  . Transportation needs    Medical: Not on file    Non-medical: Not on file  Tobacco Use  . Smoking status: Former Smoker    Packs/day: 0.25    Years: 2.00    Pack years: 0.50    Types: Cigarettes    Quit date: 10/30/1976    Years since quitting: 42.8  . Smokeless tobacco: Never Used  Substance and Sexual Activity  . Alcohol use: No  . Drug use: No  . Sexual activity: Not Currently    Birth control/protection: None  Lifestyle  . Physical activity    Days per week: Not on file    Minutes per session: Not on file  . Stress: Not on file  Relationships  . Social Herbalist on phone: Not on file    Gets together: Not on file    Attends religious service: Not on file    Active member of club or organization: Not on file    Attends meetings of clubs or organizations: Not on file    Relationship status: Not on file  Other Topics Concern  . Not on file  Social History Narrative  . Not on file     Family History:  The  patient's family history includes Cancer (age of onset: 23) in her father; Cancer (age of onset: 48) in her mother; Diabetes in her brother and maternal grandmother; Stroke (age of onset: 3) in her maternal grandfather.   ROS:   Please see the history of present illness.    ROS All other systems reviewed and are negative.  No flowsheet data found.     PHYSICAL EXAM:   VS:  BP 140/80   Pulse 98   Ht 5\' 4"  (1.626 m)   Wt 192 lb 9.6 oz (87.4 kg)   SpO2 98%   BMI 33.06 kg/m    GEN: Well nourished, well developed, in no acute distress  HEENT: normal  Neck: no JVD, carotid bruits, or masses Cardiac: RRR; no murmurs, rubs, or gallops,no edema.  Intact distal pulses bilaterally.  Respiratory:  clear to auscultation bilaterally, normal work of breathing GI: soft, nontender, nondistended, + BS MS: no deformity or atrophy  Skin: warm and dry, no rash Neuro:  Alert and Oriented x 3, Strength and sensation are intact Psych: euthymic mood, full affect  Wt Readings from Last 3 Encounters:  08/18/19 192 lb 9.6 oz (87.4 kg)  06/05/19 192 lb (87.1 kg)  07/04/18 190 lb 3.2 oz (86.3 kg)      Studies/Labs Reviewed:   EKG:  EKG is not ordered today.   Recent Labs: 06/05/2019: TSH 1.780   Lipid Panel    Component Value Date/Time   CHOL 205 (H) 06/05/2019 1645   TRIG 119 06/05/2019 1645   HDL 58 06/05/2019 1645   CHOLHDL 3.5 06/05/2019 1645   CHOLHDL 3.5 10/10/2014 1133   VLDL 16 10/10/2014 1133   LDLCALC 123 (H) 06/05/2019 1645    Additional studies/ records that were reviewed today include:   Office notes and EKG from PCP   ASSESSMENT:    1. Palpitations   2. Essential hypertension   3. LBBB (left bundle branch block)      PLAN:  In order of problems listed above:  1. Palpitations -EKG in PCP office showed NSR with LBBB  -I will get an event monitor to assess for arrhythmias  2.  HTN -BP controlled -continue HCTZ 25mg  daily  3.  Chronic LBBB -workup with echo  and stress myoview in 2013 were normal    Medication Adjustments/Labs and Tests Ordered: Current medicines  are reviewed at length with the patient today.  Concerns regarding medicines are outlined above.  Medication changes, Labs and Tests ordered today are listed in the Patient Instructions below.  There are no Patient Instructions on file for this visit.   Signed, Fransico Him, MD  08/18/2019 2:20 PM    [facility] [address], [place], [place]  [address] Phone: [phone]; Fax: [phone]

## 2019-08-18 NOTE — Patient Instructions (Signed)
Medication Instructions:  Your physician recommends that you continue on your current medications as directed. Please refer to the Current Medication list given to you today.  *If you need a refill on your cardiac medications before your next appointment, please call your pharmacy*  Lab Work: None Ordered    Testing/Procedures: Your physician has recommended that you wear an event monitor. Event monitors are medical devices that record the heart's electrical activity. Doctors most often [place] these monitors to diagnose arrhythmias. Arrhythmias are problems with the speed or rhythm of the heartbeat. The monitor is a small, portable device. You can wear one while you do your normal daily activities. This is usually used to diagnose what is causing palpitations/syncope (passing out).    Follow-Up: At [facility], you and your health needs are our priority.  As part of our continuing mission to provide you with exceptional heart care, we have created designated Provider Care Teams.  These Care Teams include your primary Cardiologist (physician) and Advanced Practice Providers (APPs -  Physician Assistants and Nurse Practitioners) who all work together to provide you with the care you need, when you need it.  Your next appointment:   As Needed  The format for your next appointment:   Either In Person or Virtual  Provider:   You may see Dr. Radford Pax or one of the following Advanced Practice Providers on your designated Care Team:    Melina Copa, PA-C  Ermalinda Barrios, PA-C

## 2019-08-27 ENCOUNTER — Telehealth: Payer: Self-pay

## 2019-08-27 NOTE — Telephone Encounter (Signed)
Spoke to pt, went over monitor instructions. Verified address. 30 day [employer] event monitor ordered.

## 2019-09-02 ENCOUNTER — Ambulatory Visit (INDEPENDENT_AMBULATORY_CARE_PROVIDER_SITE_OTHER): Payer: Medicare Other

## 2019-09-02 DIAGNOSIS — I447 Left bundle-branch block, unspecified: Secondary | ICD-10-CM

## 2019-09-02 DIAGNOSIS — R002 Palpitations: Secondary | ICD-10-CM | POA: Diagnosis not present

## 2019-09-17 ENCOUNTER — Telehealth: Payer: Self-pay | Admitting: Cardiology

## 2019-09-17 NOTE — Telephone Encounter (Signed)
Patient is calling about the holter monitor she is currently wearing.  She wants to know if she can stop wearing it, as she is having some skin issue with it.  She is breaking out in a rash, and the past few days she is getting a reading of poor skin contact.  She stated she has been wearing it for 3 weeks now.

## 2019-09-17 NOTE — Telephone Encounter (Signed)
Patient is having irritation from monitor strip and her monitor keeps reading poor contact.  Instructed patient to remove her monitor to give her skin a chance to heal.  Extra [employer] monitor strips will be left for her to pick up at the covid screening table at our [facility] office after 12:00PM, 09/18/2019.  She can reapply the monitor at her discretion once her irritation subsides.

## 2019-09-18 ENCOUNTER — Other Ambulatory Visit: Payer: Self-pay

## 2019-09-18 DIAGNOSIS — Z20822 Contact with and (suspected) exposure to covid-19: Secondary | ICD-10-CM

## 2019-09-22 LAB — NOVEL CORONAVIRUS, NAA: SARS-CoV-2, NAA: NOT DETECTED

## 2019-10-02 DIAGNOSIS — Z5181 Encounter for therapeutic drug level monitoring: Secondary | ICD-10-CM | POA: Diagnosis not present

## 2019-10-02 DIAGNOSIS — L2084 Intrinsic (allergic) eczema: Secondary | ICD-10-CM | POA: Diagnosis not present

## 2019-10-17 ENCOUNTER — Telehealth: Payer: Self-pay

## 2019-10-17 DIAGNOSIS — I493 Ventricular premature depolarization: Secondary | ICD-10-CM

## 2019-10-17 DIAGNOSIS — R002 Palpitations: Secondary | ICD-10-CM

## 2019-10-17 NOTE — Telephone Encounter (Signed)
-----   Message from Sueanne Margarita, MD sent at 10/15/2019 10:36 PM EST ----- Heart monitor showed normal rhythm with a rare extra heart beat from the bottom of the heart which are benign.  Please get a 2D echo to assess LVF

## 2019-10-17 NOTE — Telephone Encounter (Signed)
The patient has been notified of the result and verbalized understanding.  All questions (if any) were answered. Antonieta Iba, RN 10/17/2019 11:16 AM

## 2019-11-13 DIAGNOSIS — Z124 Encounter for screening for malignant neoplasm of cervix: Secondary | ICD-10-CM | POA: Diagnosis not present

## 2019-11-13 DIAGNOSIS — R87610 Atypical squamous cells of undetermined significance on cytologic smear of cervix (ASC-US): Secondary | ICD-10-CM | POA: Diagnosis not present

## 2019-11-17 ENCOUNTER — Other Ambulatory Visit: Payer: Self-pay

## 2019-11-17 ENCOUNTER — Ambulatory Visit (HOSPITAL_COMMUNITY): Payer: Medicare Other | Attending: Internal Medicine

## 2019-11-17 DIAGNOSIS — I493 Ventricular premature depolarization: Secondary | ICD-10-CM | POA: Diagnosis not present

## 2019-11-17 DIAGNOSIS — I517 Cardiomegaly: Secondary | ICD-10-CM

## 2019-11-17 DIAGNOSIS — I10 Essential (primary) hypertension: Secondary | ICD-10-CM | POA: Insufficient documentation

## 2019-11-17 DIAGNOSIS — Z87891 Personal history of nicotine dependence: Secondary | ICD-10-CM | POA: Insufficient documentation

## 2019-11-17 DIAGNOSIS — R002 Palpitations: Secondary | ICD-10-CM | POA: Diagnosis not present

## 2019-11-19 ENCOUNTER — Telehealth: Payer: Self-pay | Admitting: *Deleted

## 2019-11-19 NOTE — Telephone Encounter (Signed)
I placed call to patient to review echo results. Left message to call office 

## 2019-11-19 NOTE — Telephone Encounter (Signed)
I spoke with patient and reviewed echo results with her 

## 2019-12-03 ENCOUNTER — Other Ambulatory Visit: Payer: Self-pay | Admitting: Family Medicine

## 2019-12-03 DIAGNOSIS — I10 Essential (primary) hypertension: Secondary | ICD-10-CM

## 2019-12-03 NOTE — Telephone Encounter (Signed)
Forwarding medication refill request to the clinical pool for review. 

## 2019-12-03 NOTE — Telephone Encounter (Signed)
No further refills without office visit 

## 2019-12-03 NOTE — Telephone Encounter (Signed)
Please schedule appt for further refills. 30 day supply has been sent as a courtesy refill until appt.

## 2019-12-04 NOTE — Telephone Encounter (Signed)
Spoke with pt and she states she did not ask for the prescription, but she was informed that it was a 30 day courtesty refill and if she wanted to fill it in the future, an ov is needed. Pt understands

## 2019-12-28 ENCOUNTER — Other Ambulatory Visit: Payer: Self-pay | Admitting: Family Medicine

## 2019-12-28 DIAGNOSIS — I10 Essential (primary) hypertension: Secondary | ICD-10-CM

## 2019-12-28 NOTE — Telephone Encounter (Signed)
Requested medication (s) are due for refill today: yes  Requested medication (s) are on the active medication list: yes  Last refill:  12/03/19  Future visit scheduled: no  Notes to clinic:  > 3 months overdue for appt. Pt was already given 30 day courtesy refill   Requested Prescriptions  Pending Prescriptions Disp Refills   hydrochlorothiazide (HYDRODIURIL) 25 MG tablet [Pharmacy Med Name: HYDROCHLOROTHIAZIDE 25 MG TAB] 30 tablet 0    Sig: TAKE 1 TABLET BY MOUTH EVERY DAY      Cardiovascular: Diuretics - Thiazide Failed - 12/28/2019  1:30 PM      Failed - Ca in normal range and within 360 days    Calcium  Date Value Ref Range Status  07/10/2017 9.8 8.7 - 10.3 mg/dL Final  09/23/2013 9.4 8.4 - 10.4 mg/dL Final          Failed - Cr in normal range and within 360 days    Creatinine  Date Value Ref Range Status  09/23/2013 0.7 0.6 - 1.1 mg/dL Final   Creat  Date Value Ref Range Status  01/26/2016 0.63 0.50 - 0.99 mg/dL Final   Creatinine, Ser  Date Value Ref Range Status  07/10/2017 0.74 0.57 - 1.00 mg/dL Final          Failed - K in normal range and within 360 days    Potassium  Date Value Ref Range Status  07/10/2017 3.9 3.5 - 5.2 mmol/L Final  09/23/2013 3.7 3.5 - 5.1 mEq/L Final          Failed - Na in normal range and within 360 days    Sodium  Date Value Ref Range Status  07/10/2017 141 134 - 144 mmol/L Final  09/23/2013 143 136 - 145 mEq/L Final          Failed - Last BP in normal range    BP Readings from Last 1 Encounters:  08/18/19 140/80          Failed - Valid encounter within last 6 months    Recent Outpatient Visits           6 months ago Medicare annual wellness visit, subsequent   [facility], Ranell Patrick, MD   1 year ago Essential hypertension   [facility], Ranell Patrick, MD   1 year ago Lip swelling   [facility], Ranell Patrick, MD   2 years ago Essential hypertension   [facility], Ranell Patrick, MD   2 years ago Rash and nonspecific skin eruption   [facility], Ranell Patrick, MD

## 2020-01-29 DIAGNOSIS — L2084 Intrinsic (allergic) eczema: Secondary | ICD-10-CM | POA: Diagnosis not present

## 2020-01-29 DIAGNOSIS — Z5181 Encounter for therapeutic drug level monitoring: Secondary | ICD-10-CM | POA: Diagnosis not present

## 2020-02-09 ENCOUNTER — Telehealth (INDEPENDENT_AMBULATORY_CARE_PROVIDER_SITE_OTHER): Payer: Medicare Other | Admitting: Family Medicine

## 2020-02-09 ENCOUNTER — Other Ambulatory Visit: Payer: Self-pay

## 2020-02-09 VITALS — BP 126/77 | Ht 64.0 in | Wt 194.0 lb

## 2020-02-09 DIAGNOSIS — R7303 Prediabetes: Secondary | ICD-10-CM

## 2020-02-09 DIAGNOSIS — I10 Essential (primary) hypertension: Secondary | ICD-10-CM

## 2020-02-09 DIAGNOSIS — E785 Hyperlipidemia, unspecified: Secondary | ICD-10-CM

## 2020-02-09 MED ORDER — HYDROCHLOROTHIAZIDE 25 MG PO TABS: 25.0000 mg | ORAL_TABLET | Freq: Every day | ORAL | 2 refills | Status: DC

## 2020-02-09 NOTE — Progress Notes (Signed)
Virtual Visit via Video Note  I connected with Toni Williams on 02/09/20 at 6:13 PM by a video enabled telemedicine application and verified that I am speaking with the correct person using two identifiers.   I discussed the limitations, risks, security and privacy concerns of performing an evaluation and management service by telephone and the availability of in person appointments. I also discussed with the patient that there may be a patient responsible charge related to this service. The patient expressed understanding and agreed to proceed, consent obtained  Chief complaint: Chief Complaint  Patient presents with  . Medication Refill    HTZ medication. pt states no issues with medication and BP has been running within normal range. ni physical symptoms    History of Present Illness: Toni Williams is a 69 y.o. female  Hypertension: hctz 25mg  QD No new side effects.  Constitutional: Negative for fatigue and unexpected weight change.  Eyes: Negative for visual disturbance.  Respiratory: Negative for cough, chest tightness and shortness of breath.   Cardiovascular: Negative for chest pain,  new palpitations and leg swelling.  Gastrointestinal: Negative for abdominal pain and blood in stool.  Neurological: Negative for dizziness, light-headedness and headaches.  Had derm eval April 1st  Told CBC and metabolic testing ok. Has on video, but unable to make out on resolution.  Creatinine 0.70.    Home readings: 126/77 at derm a few weeks ago  BP Readings from Last 3 Encounters:  02/09/20 126/77  08/18/19 140/80  06/05/19 140/80   Lab Results  Component Value Date   CREATININE 0.74 07/10/2017   Prediabetes: Glucose 83 at derm visit few weeks ago.  Lab Results  Component Value Date   HGBA1C 6.3 (H) 06/05/2019   Wt Readings from Last 3 Encounters:  02/09/20 194 lb (88 kg)  08/18/19 192 lb 9.6 oz (87.4 kg)  06/05/19 192 lb (87.1 kg)  no regular exercise - plans  on getting back into exercise as both covid vaccines.    Hyperlipidemia: No meds. Diet and exercise plan.  The 10-year ASCVD risk score Mikey Bussing DC Brooke Bonito., et al., 2013) is: 11.3%   Values used to calculate the score:     Age: 39 years     Sex: Female     Is Non-Hispanic African American: Yes     Diabetic: No     Tobacco smoker: No     Systolic Blood Pressure: 123XX123 mmHg     Is BP treated: Yes     HDL Cholesterol: 58 mg/dL     Total Cholesterol: 205 mg/dL  Lab Results  Component Value Date   CHOL 205 (H) 06/05/2019   HDL 58 06/05/2019   LDLCALC 123 (H) 06/05/2019   TRIG 119 06/05/2019   CHOLHDL 3.5 06/05/2019   Lab Results  Component Value Date   ALT 19 10/10/2014   AST 20 10/10/2014   ALKPHOS 92 10/10/2014   BILITOT 0.8 10/10/2014     Patient Active Problem List   Diagnosis Date Noted  . Chest pain 05/17/2012  . Abnormal resting ECG findings 04/24/2012  . Vitamin D deficiency 04/24/2012  . Hypertension 02/11/2012  . Breast cancer ([place]) 02/11/2012  . Eczema 02/11/2012  . Allergic rhinitis due to allergen 02/11/2012   Past Medical History:  Diagnosis Date  . Anxiety    no meds  . Blood transfusion 1979   with missed abortion in [facility] [place]  . Cancer ([place])   . Hypertension   . Missed  abortion    x 1  . Seasonal allergies   . Termination of pregnancy    x 1   Past Surgical History:  Procedure Laterality Date  . BREAST SURGERY  10/03/2007, 11/06/2007   Left breast excision, re-excision Left breast  . BREAST SURGERY     No IV sticks/Blood Draws/Blood Pressure Left Arm,  . CESAREAN SECTION    . CHOLECYSTECTOMY    . colonscopy    . DILATION AND CURETTAGE OF UTERUS    . HYSTEROSCOPY WITH D & C  10/27/2011   Procedure: DILATATION AND CURETTAGE /HYSTEROSCOPY;  Surgeon: Delice Lesch, MD;  Location: [facility];  Service: Gynecology;  Laterality: N/A;  . WISDOM TOOTH EXTRACTION     Allergies  Allergen Reactions  . Morphine And Related Other (See Comments)     "psychotic" reaction per pt report  . Shellfish Allergy Swelling  . Latex Rash    itching   Prior to Admission medications   Medication Sig Start Date End Date Taking? Authorizing Provider  azelastine (ASTELIN) 0.1 % nasal spray Place 1 spray into both nostrils 2 (two) times daily. Use in each nostril as directed 06/05/19  Yes Wendie Agreste, MD  fluticasone [facility]) 50 MCG/ACT nasal spray Place 1-2 sprays into both nostrils daily. 06/05/19  Yes Wendie Agreste, MD  hydrochlorothiazide (HYDRODIURIL) 25 MG tablet TAKE 1 TABLET BY MOUTH EVERY DAY 12/30/19  Yes Wendie Agreste, MD  methotrexate 2.5 MG tablet Take 2.5 mg by mouth once a week.    Yes [provider]  cholecalciferol (VITAMIN D) 1000 UNITS tablet Take 2,000 Units by mouth daily.      [provider]   Social History   Socioeconomic History  . Marital status: Married    Spouse name: Not on file  . Number of children: Not on file  . Years of education: Not on file  . Highest education level: Not on file  Occupational History  . Not on file  Tobacco Use  . Smoking status: Former Smoker    Packs/day: 0.25    Years: 2.00    Pack years: 0.50    Types: Cigarettes    Quit date: 10/30/1976    Years since quitting: 43.3  . Smokeless tobacco: Never Used  Substance and Sexual Activity  . Alcohol use: No  . Drug use: No  . Sexual activity: Not Currently    Birth control/protection: None  Other Topics Concern  . Not on file  Social History Narrative  . Not on file   Social Determinants of Health   Financial Resource Strain:   . Difficulty of Paying Living Expenses:   Food Insecurity:   . Worried About Charity fundraiser in the Last Year:   . Arboriculturist in the Last Year:   Transportation Needs:   . Film/video editor (Medical):   Marland Kitchen Lack of Transportation (Non-Medical):   Physical Activity:   . Days of Exercise per Week:   . Minutes of Exercise per Session:   Stress:   . Feeling of  Stress :   Social Connections:   . Frequency of Communication with Friends and Family:   . Frequency of Social Gatherings with Friends and Family:   . Attends Religious Services:   . Active Member of Clubs or Organizations:   . Attends Archivist Meetings:   Marland Kitchen Marital Status:   Intimate Partner Violence:   . Fear of Current or Ex-Partner:   .  Emotionally Abused:   Marland Kitchen Physically Abused:   . Sexually Abused:     Observations/Objective: Vitals:   02/09/20 1352  BP: 126/77  Weight: 194 lb (88 kg)  Height: 5\' 4"  (1.626 m)     Assessment and Plan: Prediabetes - Plan: Hemoglobin A1c  -Plan for increased exercise, lab only visit plan for A1c.  Essential hypertension - Plan: hydrochlorothiazide (HYDRODIURIL) 25 MG tablet  -Stable by outside readings, tolerating HCTZ, continue same.  Lab only visit planned.  Asked that she bring a copy of her electrolytes, blood work from dermatology  Hyperlipidemia, unspecified hyperlipidemia type - Plan: Lipid panel  -Prior elevated ASCVD risk score, will check labs at lab only visit.  Plan to increase exercise.  Follow Up Instructions: Patient Instructions   No med changes for now, but will check cholesterol and prediabetes test at upcoming lab visit.  Follow-up with me in 6 months unless there are concerns on that lab work.  Please bring a copy of your labs from dermatology appointment to the lab at visit surveys can be scanned.  Good talking to you today.  Let me know if there are questions.    If you have lab work done today you will be contacted with your lab results within the next 2 weeks.  If you have not heard from [place] then please contact us. The fastest way to get your results is to register for My Chart.   IF you received an x-ray today, you will receive an invoice from [facility]. Please contact [facility] at [phone] with questions or concerns regarding your invoice.   IF you received labwork today,  you will receive an invoice from [place]. Please contact [facility] at [phone] with questions or concerns regarding your invoice.   Our billing staff will not be able to assist you with questions regarding bills from these companies.  You will be contacted with the lab results as soon as they are available. The fastest way to get your results is to activate your My Chart account. Instructions are located on the last page of this paperwork. If you have not heard from [place] regarding the results in 2 weeks, please contact this office.         I discussed the assessment and treatment plan with the patient. The patient was provided an opportunity to ask questions and all were answered. The patient agreed with the plan and demonstrated an understanding of the instructions.   The patient was advised to call back or seek an in-person evaluation if the symptoms worsen or if the condition fails to improve as anticipated.  I provided 18 minutes of non-face-to-face time during this encounter.   Wendie Agreste, MD

## 2020-02-09 NOTE — Patient Instructions (Addendum)
No med changes for now, but will check cholesterol and prediabetes test at upcoming lab visit.  Follow-up with me in 6 months unless there are concerns on that lab work.  Please bring a copy of your labs from dermatology appointment to the lab at visit surveys can be scanned.  Good talking to you today.  Let me know if there are questions.    If you have lab work done today you will be contacted with your lab results within the next 2 weeks.  If you have not heard from [place] then please contact us. The fastest way to get your results is to register for My Chart.   IF you received an x-ray today, you will receive an invoice from [facility]. Please contact [facility]iology at [phone] with questions or concerns regarding your invoice.   IF you received labwork today, you will receive an invoice from [place]. Please contact [facility] at [phone] with questions or concerns regarding your invoice.   Our billing staff will not be able to assist you with questions regarding bills from these companies.  You will be contacted with the lab results as soon as they are available. The fastest way to get your results is to activate your My Chart account. Instructions are located on the last page of this paperwork. If you have not heard from [place] regarding the results in 2 weeks, please contact this office.

## 2020-02-10 NOTE — Progress Notes (Signed)
Called patient and Scheduled the lab work , although she stated that she would call back and schedule the 6 month follow up

## 2020-03-12 ENCOUNTER — Ambulatory Visit (INDEPENDENT_AMBULATORY_CARE_PROVIDER_SITE_OTHER): Payer: Medicare Other | Admitting: Family Medicine

## 2020-03-12 ENCOUNTER — Other Ambulatory Visit: Payer: Self-pay

## 2020-03-12 DIAGNOSIS — E785 Hyperlipidemia, unspecified: Secondary | ICD-10-CM

## 2020-03-12 DIAGNOSIS — R7303 Prediabetes: Secondary | ICD-10-CM | POA: Diagnosis not present

## 2020-03-12 NOTE — Progress Notes (Signed)
Labs only visit

## 2020-03-13 LAB — LIPID PANEL
Chol/HDL Ratio: 3.1 ratio (ref 0.0–4.4)
Cholesterol, Total: 188 mg/dL (ref 100–199)
HDL: 61 mg/dL (ref >39)
LDL Chol Calc (NIH): 113 mg/dL — ABNORMAL HIGH (ref 0–99)
Triglycerides: 79 mg/dL (ref 0–149)
VLDL Cholesterol Cal: 14 mg/dL (ref 5–40)

## 2020-03-13 LAB — HEMOGLOBIN A1C
Est. average glucose Bld gHb Est-mCnc: 143 mg/dL
Hgb A1c MFr Bld: 6.6 % — ABNORMAL HIGH (ref 4.8–5.6)

## 2020-04-30 ENCOUNTER — Telehealth: Payer: Self-pay | Admitting: Family Medicine

## 2020-04-30 ENCOUNTER — Other Ambulatory Visit: Payer: Self-pay

## 2020-04-30 ENCOUNTER — Telehealth (INDEPENDENT_AMBULATORY_CARE_PROVIDER_SITE_OTHER): Payer: Medicare Other | Admitting: Family Medicine

## 2020-04-30 VITALS — Temp 100.4°F | Ht 64.0 in | Wt 192.0 lb

## 2020-04-30 DIAGNOSIS — M549 Dorsalgia, unspecified: Secondary | ICD-10-CM | POA: Diagnosis not present

## 2020-04-30 DIAGNOSIS — R509 Fever, unspecified: Secondary | ICD-10-CM

## 2020-04-30 DIAGNOSIS — B349 Viral infection, unspecified: Secondary | ICD-10-CM | POA: Diagnosis not present

## 2020-04-30 NOTE — Telephone Encounter (Signed)
See note

## 2020-04-30 NOTE — Progress Notes (Signed)
Patient ID: Toni Williams, female    DOB: 06-Sep-1951  Age: 69 y.o. MRN: [id number]  Chief Complaint  Patient presents with  . Fever    Pt stated that she has been running a fever of 101 which started yesterday. No cough/chills just fatigue. She also said that she took tylenol to help break the fever.    Subjective:   Telephone appointment for 3 PM.  Attempted to call home and cellular phones at 3:36 PM with no response.  Will try again later.  At about 5:20 PM I held a 8 or 10-minute conversation with the patient.  She has appropriately identified herself, is in a secure area in her home.  Her husband is in the background.  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  She understands that insurance will be billed and she may be responsible for her portions.  Yesterday the patient noted that she was not feeling well and took her temperature.  It was 101.  She has taken a couple of Tylenol.  She took one about 6 hours ago.  Today she has a temperature of 102 on her last check.  She has generalized malaise.  No other specific symptoms.  No runny nose or sore throat.  She coughed a time or 2 but nothing remarkable.  Had some mid back pain.  No nausea or vomiting.  No dysuria or frequency.  No other known source of infection.  No known exposure.  She has not had any change in her taste or smell.  She has had a Covid vaccines, both of them.  She is on methotrexate for her eczema and her dermatologist follows her blood counts.  I do not have access to them at this time.  Current allergies, medications, problem list, past/family and social histories reviewed.  Objective:  Temp (!) 100.4 F (38 C)   Ht 5\' 4"  (1.626 m)   Wt 192 lb (87.1 kg)   BMI 32.96 kg/m   This was a telephone visit so was unable to examine the patient obviously.  Her husband percussed her back for me and there was no pain elicited.  Assessment & Plan:   Assessment: 1. Fever in  adult   2. Viral illness   3. Back pain without radiation       Plan: Probably a viral illness.  No orders of the defined types were placed in this encounter.   No orders of the defined types were placed in this encounter.        Patient Instructions   Drink plenty of fluids.  Take Tylenol 500 mg 2 pills 3 times daily (acetaminophen)  If more symptoms you can also take ibuprofen 200 mg 2 or 3 pills 3 times daily.  If worse go to an urgent care or emergency room to get checked to see if urine or lungs or anything else is the source of the fever.  Let us know if we can be of assistance.  Closed for the weekend.   If you have lab work done today you will be contacted with your lab results within the next 2 weeks.  If you have not heard from [place] then please contact us. The fastest way to get your results is to register for My Chart.   IF you received an x-ray today, you will receive an invoice from [facility]. Please contact [facility] Radiology at [phone] with questions or concerns regarding your  invoice.   IF you received labwork today, you will receive an invoice from [place]. Please contact [facility] at [phone] with questions or concerns regarding your invoice.   Our billing staff will not be able to assist you with questions regarding bills from these companies.  You will be contacted with the lab results as soon as they are available. The fastest way to get your results is to activate your My Chart account. Instructions are located on the last page of this paperwork. If you have not heard from [place] regarding the results in 2 weeks, please contact this office.        Return if symptoms worsen or fail to improve.   Ruben Reason, MD 04/30/2020

## 2020-04-30 NOTE — Patient Instructions (Addendum)
Drink plenty of fluids.  Take Tylenol 500 mg 2 pills 3 times daily (acetaminophen)  If more symptoms you can also take ibuprofen 200 mg 2 or 3 pills 3 times daily.  If worse go to an urgent care or emergency room to get checked to see if urine or lungs or anything else is the source of the fever.  Let us know if we can be of assistance.  Closed for the weekend.   If you have lab work done today you will be contacted with your lab results within the next 2 weeks.  If you have not heard from [place] then please contact us. The fastest way to get your results is to register for My Chart.   IF you received an x-ray today, you will receive an invoice from [facility]. Please contact [facility]iology at [phone] with questions or concerns regarding your invoice.   IF you received labwork today, you will receive an invoice from [place]. Please contact [facility] at [phone] with questions or concerns regarding your invoice.   Our billing staff will not be able to assist you with questions regarding bills from these companies.  You will be contacted with the lab results as soon as they are available. The fastest way to get your results is to activate your My Chart account. Instructions are located on the last page of this paperwork. If you have not heard from [place] regarding the results in 2 weeks, please contact this office.

## 2020-05-03 ENCOUNTER — Emergency Department (HOSPITAL_BASED_OUTPATIENT_CLINIC_OR_DEPARTMENT_OTHER)
Admission: EM | Admit: 2020-05-03 | Discharge: 2020-05-03 | Disposition: A | Discharge status: Home / Self Care | Payer: Medicare Other | Attending: Emergency Medicine | Admitting: Emergency Medicine

## 2020-05-03 ENCOUNTER — Other Ambulatory Visit: Payer: Self-pay

## 2020-05-03 ENCOUNTER — Encounter (HOSPITAL_BASED_OUTPATIENT_CLINIC_OR_DEPARTMENT_OTHER): Payer: Self-pay

## 2020-05-03 ENCOUNTER — Emergency Department (HOSPITAL_BASED_OUTPATIENT_CLINIC_OR_DEPARTMENT_OTHER): Payer: Medicare Other

## 2020-05-03 DIAGNOSIS — Z853 Personal history of malignant neoplasm of breast: Secondary | ICD-10-CM | POA: Insufficient documentation

## 2020-05-03 DIAGNOSIS — G8929 Other chronic pain: Secondary | ICD-10-CM | POA: Diagnosis not present

## 2020-05-03 DIAGNOSIS — Z859 Personal history of malignant neoplasm, unspecified: Secondary | ICD-10-CM | POA: Diagnosis not present

## 2020-05-03 DIAGNOSIS — R509 Fever, unspecified: Secondary | ICD-10-CM | POA: Diagnosis not present

## 2020-05-03 DIAGNOSIS — R531 Weakness: Secondary | ICD-10-CM | POA: Insufficient documentation

## 2020-05-03 DIAGNOSIS — M545 Low back pain: Secondary | ICD-10-CM | POA: Insufficient documentation

## 2020-05-03 DIAGNOSIS — Z87891 Personal history of nicotine dependence: Secondary | ICD-10-CM | POA: Diagnosis not present

## 2020-05-03 DIAGNOSIS — J811 Chronic pulmonary edema: Secondary | ICD-10-CM | POA: Diagnosis not present

## 2020-05-03 DIAGNOSIS — J189 Pneumonia, unspecified organism: Secondary | ICD-10-CM | POA: Diagnosis not present

## 2020-05-03 DIAGNOSIS — I10 Essential (primary) hypertension: Secondary | ICD-10-CM | POA: Insufficient documentation

## 2020-05-03 DIAGNOSIS — R05 Cough: Secondary | ICD-10-CM | POA: Diagnosis not present

## 2020-05-03 LAB — URINALYSIS, ROUTINE W REFLEX MICROSCOPIC
Bilirubin Urine: NEGATIVE
Glucose, UA: NEGATIVE mg/dL
Ketones, ur: NEGATIVE mg/dL
Nitrite: NEGATIVE
Protein, ur: 30 mg/dL — AB
Specific Gravity, Urine: 1.02 (ref 1.005–1.030)
pH: 6 (ref 5.0–8.0)

## 2020-05-03 LAB — URINALYSIS, MICROSCOPIC (REFLEX)

## 2020-05-03 LAB — CBC WITH DIFFERENTIAL/PLATELET
Abs Immature Granulocytes: 0.04 10*3/uL (ref 0.00–0.07)
Basophils Absolute: 0 10*3/uL (ref 0.0–0.1)
Basophils Relative: 0 %
Eosinophils Absolute: 0 10*3/uL (ref 0.0–0.5)
Eosinophils Relative: 0 %
HCT: 37.9 % (ref 36.0–46.0)
Hemoglobin: 12.6 g/dL (ref 12.0–15.0)
Immature Granulocytes: 0 %
Lymphocytes Relative: 23 %
Lymphs Abs: 2.2 10*3/uL (ref 0.7–4.0)
MCH: 26.5 pg (ref 26.0–34.0)
MCHC: 33.2 g/dL (ref 30.0–36.0)
MCV: 79.6 fL — ABNORMAL LOW (ref 80.0–100.0)
Monocytes Absolute: 1.8 10*3/uL — ABNORMAL HIGH (ref 0.1–1.0)
Monocytes Relative: 18 %
Neutro Abs: 5.6 10*3/uL (ref 1.7–7.7)
Neutrophils Relative %: 59 %
Platelets: 256 10*3/uL (ref 150–400)
RBC: 4.76 MIL/uL (ref 3.87–5.11)
RDW: 14.5 % (ref 11.5–15.5)
WBC: 9.6 10*3/uL (ref 4.0–10.5)
nRBC: 0 % (ref 0.0–0.2)

## 2020-05-03 LAB — BASIC METABOLIC PANEL
Anion gap: 13 (ref 5–15)
BUN: 12 mg/dL (ref 8–23)
CO2: 25 mmol/L (ref 22–32)
Calcium: 8.8 mg/dL — ABNORMAL LOW (ref 8.9–10.3)
Chloride: 95 mmol/L — ABNORMAL LOW (ref 98–111)
Creatinine, Ser: 0.87 mg/dL (ref 0.44–1.00)
GFR calc Af Amer: >60 mL/min (ref >60)
GFR calc non Af Amer: >60 mL/min (ref >60)
Glucose, Bld: 138 mg/dL — ABNORMAL HIGH (ref 70–99)
Potassium: 3.5 mmol/L (ref 3.5–5.1)
Sodium: 133 mmol/L — ABNORMAL LOW (ref 135–145)

## 2020-05-03 MED ORDER — AZITHROMYCIN 250 MG PO TABS
250.0000 mg | ORAL_TABLET | Freq: Every day | ORAL | 0 refills | Status: DC
Start: 2020-05-03 — End: 2020-06-10

## 2020-05-03 MED ORDER — AMOXICILLIN-POT CLAVULANATE 875-125 MG PO TABS
1.0000 | ORAL_TABLET | Freq: Two times a day (BID) | ORAL | 0 refills | Status: DC
Start: 2020-05-03 — End: 2020-05-03

## 2020-05-03 MED ORDER — ACETAMINOPHEN 325 MG PO TABS
650.0000 mg | ORAL_TABLET | Freq: Once | ORAL | Status: AC
  Administered 2020-05-03: 650 mg via ORAL
  Filled 2020-05-03: qty 2

## 2020-05-03 MED ORDER — DOXYCYCLINE HYCLATE 100 MG PO CAPS
100.0000 mg | ORAL_CAPSULE | Freq: Two times a day (BID) | ORAL | 0 refills | Status: DC
Start: 2020-05-03 — End: 2020-05-03

## 2020-05-03 MED ORDER — AMOXICILLIN-POT CLAVULANATE 875-125 MG PO TABS
1.0000 | ORAL_TABLET | Freq: Two times a day (BID) | ORAL | 0 refills | Status: DC
Start: 2020-05-03 — End: 2020-06-10

## 2020-05-03 MED ORDER — AZITHROMYCIN 250 MG PO TABS
250.0000 mg | ORAL_TABLET | Freq: Every day | ORAL | 0 refills | Status: DC
Start: 2020-05-03 — End: 2020-05-03

## 2020-05-03 MED FILL — AMOXICILLIN-POT CLAVULANATE: 875-125 | 7 days supply | Qty: 14 | Fill #0

## 2020-05-03 MED FILL — AZITHROMYCIN 250 MG TABLET: 250 | 5 days supply | Qty: 6 | Fill #0

## 2020-05-03 NOTE — Discharge Instructions (Addendum)
You were seen in the ED for fever and cough  X-ray reveals small pneumonia in your right lower lung  Please take antibiotics as prescribed.  Alternate ibuprofen and acetaminophen as needed for fever  Stay hydrated  Contact your primary care doctor make an appointment in the next 2 to 3 days for recheck and to make sure you are improving  Return to the ED for worsening cough, chest pain, shortness of breath, persistent fever despite medicines

## 2020-05-03 NOTE — ED Triage Notes (Signed)
Pt c/o intermittent fever sine 7/1-denies pain-cough started yesterday-PCP televisit 7/2 was advised to take OTC meds for fever and f/u prn-NAD-steady gait

## 2020-05-03 NOTE — ED Provider Notes (Signed)
[facility] EMERGENCY DEPARTMENT Provider Note   CSN: [id number] Arrival date & time: 05/03/20  1123     History Chief Complaint  Patient presents with  . Fever    Toni Williams is a 69 y.o. female with history of eczema on methotrexate presents to ED for evaluation of fever. Onset July 1st. As high as 102.7 orally.  Developed mild cough yesterday. Associated with no energy, generalized weakness.  Had telemedicine visit with PCP who recommended ibuprofen and tylenol for fever and ER visit if continued fever for possible chest x-ray and urinalysis.  Fully vaccinated for COVID. No sick contacts. No travel.  Tested negative yesterday for COVID.  Reports chronic mild low back pain bilaterally that is unchanged. No dysuria, hematuria, urinary frequency. No lower abdominal or flank pain. No nausea, vomiting, diarrhea.  No congestin, sore throat, chest pain or shortness of breath.  No tick exposure or rash.  Quit smoking 30 days ago.  HPI     Past Medical History:  Diagnosis Date  . Anxiety    no meds  . Blood transfusion 1979   with missed abortion in [facility] [place]  . Cancer ([place])   . Hypertension   . Missed abortion    x 1  . Seasonal allergies   . Termination of pregnancy    x 1    Patient Active Problem List   Diagnosis Date Noted  . Chest pain 05/17/2012  . Abnormal resting ECG findings 04/24/2012  . Vitamin D deficiency 04/24/2012  . Hypertension 02/11/2012  . Breast cancer ([place]) 02/11/2012  . Eczema 02/11/2012  . Allergic rhinitis due to allergen 02/11/2012    Past Surgical History:  Procedure Laterality Date  . BREAST SURGERY  10/03/2007, 11/06/2007   Left breast excision, re-excision Left breast  . BREAST SURGERY     No IV sticks/Blood Draws/Blood Pressure Left Arm,  . CESAREAN SECTION    . CESAREAN SECTION N/A    Phreesia 04/30/2020  . CHOLECYSTECTOMY    . colonscopy    . DILATION AND CURETTAGE OF UTERUS    . HYSTEROSCOPY WITH D & C   10/27/2011   Procedure: DILATATION AND CURETTAGE /HYSTEROSCOPY;  Surgeon: Delice Lesch, MD;  Location: [facility];  Service: Gynecology;  Laterality: N/A;  . WISDOM TOOTH EXTRACTION       OB History   No obstetric history on file.     Family History  Problem Relation Age of Onset  . Cancer Mother 8       breast (R),stomach, and uterine  . Cancer Father 50       lung from smoking  3 pks/day  . Diabetes Maternal Grandmother        amputation of (L) leg  . Stroke Maternal Grandfather 60  . Diabetes Brother     Social History   Tobacco Use  . Smoking status: Former Smoker    Packs/day: 0.25    Years: 2.00    Pack years: 0.50    Types: Cigarettes    Quit date: 10/30/1976    Years since quitting: 43.5  . Smokeless tobacco: Never Used  Substance Use Topics  . Alcohol use: No  . Drug use: No    Home Medications Prior to Admission medications   Medication Sig Start Date End Date Taking? Authorizing Provider  amoxicillin-clavulanate (AUGMENTIN) 875-125 MG tablet Take 1 tablet by mouth every 12 (twelve) hours. 05/03/20   Kinnie Feil, PA-C  azelastine (ASTELIN) 0.1 %  nasal spray Place 1 spray into both nostrils 2 (two) times daily. Use in each nostril as directed 06/05/19   Wendie Agreste, MD  azithromycin (ZITHROMAX) 250 MG tablet Take 1 tablet (250 mg total) by mouth daily. Take first 2 tablets together, then 1 every day until finished. 05/03/20   Kinnie Feil, PA-C  cholecalciferol (VITAMIN D) 1000 UNITS tablet Take 2,000 Units by mouth daily.      [provider]  fluticasone (FLONASE) 50 MCG/ACT nasal spray Place 1-2 sprays into both nostrils daily. 06/05/19   Wendie Agreste, MD  hydrochlorothiazide (HYDRODIURIL) 25 MG tablet Take 1 tablet (25 mg total) by mouth daily. 02/09/20   Wendie Agreste, MD  methotrexate 2.5 MG tablet Take 2.5 mg by mouth once a week.     [provider]    Allergies    Morphine and related, Shellfish allergy, and  Latex  Review of Systems   Review of Systems  Constitutional: Positive for chills, fatigue and fever.  Respiratory: Positive for cough.   All other systems reviewed and are negative.   Physical Exam Updated Vital Signs BP 105/71   Pulse (!) 112   Temp (!) 100.4 F (38 C)   Resp 16   Ht 5\' 4"  (1.626 m)   Wt 87.5 kg   SpO2 97%   BMI 33.09 kg/m   Physical Exam Vitals and nursing note reviewed.  Constitutional:      Appearance: She is well-developed.     Comments: Non toxic in NAD  HENT:     Head: Normocephalic and atraumatic.     Nose: Nose normal.  Eyes:     Conjunctiva/sclera: Conjunctivae normal.  Cardiovascular:     Rate and Rhythm: Normal rate and regular rhythm.  Pulmonary:     Effort: Pulmonary effort is normal.     Breath sounds: Normal breath sounds.  Abdominal:     General: Bowel sounds are normal.     Palpations: Abdomen is soft.     Tenderness: There is no abdominal tenderness.     Comments: No G/R/R. No suprapubic or CVA tenderness. Negative Murphy's and McBurney's. Active BS to lower quadrants.   Musculoskeletal:        General: Normal range of motion.     Cervical back: Normal range of motion.  Skin:    General: Skin is warm and dry.     Capillary Refill: Capillary refill takes less than 2 seconds.  Neurological:     Mental Status: She is alert.  Psychiatric:        Behavior: Behavior normal.     ED Results / Procedures / Treatments   Labs (all labs ordered are listed, but only abnormal results are displayed) Labs Reviewed  BASIC METABOLIC PANEL - Abnormal; Notable for the following components:      Result Value   Sodium 133 (*)    Chloride 95 (*)    Glucose, Bld 138 (*)    Calcium 8.8 (*)    All other components within normal limits  URINALYSIS, ROUTINE W REFLEX MICROSCOPIC - Abnormal; Notable for the following components:   APPearance HAZY (*)    Hgb urine dipstick SMALL (*)    Protein, ur 30 (*)    Leukocytes,Ua TRACE (*)    All  other components within normal limits  URINALYSIS, MICROSCOPIC (REFLEX) - Abnormal; Notable for the following components:   Bacteria, UA MANY (*)    All other components within normal limits  CBC  WITH DIFFERENTIAL/PLATELET - Abnormal; Notable for the following components:   MCV 79.6 (*)    Monocytes Absolute 1.8 (*)    All other components within normal limits  CBC WITH DIFFERENTIAL/PLATELET    EKG None  Radiology DG Chest Port 1 View  Result Date: 05/03/2020 CLINICAL DATA:  Intermittent fevers.  Cough. EXAM: PORTABLE CHEST 1 VIEW COMPARISON:  02/22/2013 FINDINGS: Heart size is normal. There is minimal patchy opacity in the RIGHT LOWER lobe, consistent infiltrate. No frank consolidation. No pulmonary edema. No pleural effusions. IMPRESSION: RIGHT LOWER lobe infiltrate. Electronically Signed   By: Nolon Nations M.D.   On: 05/03/2020 13:01    Procedures Procedures (including critical care time)  Medications Ordered in ED Medications  acetaminophen (TYLENOL) tablet 650 mg (650 mg Oral Given 05/03/20 1250)    ED Course  I have reviewed the triage vital signs and the nursing notes.  Pertinent labs & imaging results that were available during my care of the patient were reviewed by me and considered in my medical decision making (see chart for details).  Clinical Course as of May 04 1451  Mon May 03, 2020  1218 Temp(!): 100.8 F (38.2 C) [CG]  1219 Pulse Rate(!): 119 [CG]  1223 Last tylenol last night    [CG]  1356 Temp(!): 100.4 F (38 C) [CG]  1356 Pulse Rate: 95 [CG]  1357 Heart size is normal. There is minimal patchy opacity in the RIGHT LOWER lobe, consistent infiltrate. No frank consolidation. No pulmonary edema. No pleural effusions.  DG Chest Port 1 View [CG]    Clinical Course User Index [CG] Arlean Hopping   MDM Rules/Calculators/A&P                          69 year old presents with 5 days of fever.  Developed a very mild cough yesterday.  On  arrival she is mildly febrile.  Heart rate 119.  Hemodynamically stable without hypoxia.  Overall well-appearing.  Lungs clear.  No lower abdominal or suprapubic or CVA tenderness.  Does not appear septic or critically ill.  Will give tylenol and re-check vital signs.  I have ordered screening labs, urinalysis, chest x-ray.  She is fully vaccinated for COVID-19.  No recent travel.  I ordered Tylenol.  Your work-up personally visualized and interpreted, as above.   Very mild right lower infiltrate.  No leukocytosis.  Electrolytes otherwise normal.  Her fever has improved.  Tachycardia resolved. I reevaluated patient and she has no complaints.  SBP lower now in the low 100s.  SPO2 greater than 92%.  Tachycardia has resolved.  Again, does not appear critically ill.  Discussed with EDP who recommends ambulation with pulse ox.    Patient ambulated without hypoxia, mild tachycardia noted. Will discharge with combo antibiotic therapy given history of immunosuppression. Return precautions discussed with patient and husband who are in agreement with ER treatment and POC. Final Clinical Impression(s) / ED Diagnoses Final diagnoses:  Community acquired pneumonia of right lower lobe of lung    Rx / DC Orders ED Discharge Orders         Ordered    doxycycline (VIBRAMYCIN) 100 MG capsule  2 times daily,   Status:  Discontinued     Reprint     05/03/20 1402    amoxicillin-clavulanate (AUGMENTIN) 875-125 MG tablet  Every 12 hours,   Status:  Discontinued     Reprint     05/03/20 1448  azithromycin (ZITHROMAX) 250 MG tablet  Daily,   Status:  Discontinued     Reprint     05/03/20 1448    amoxicillin-clavulanate (AUGMENTIN) 875-125 MG tablet  Every 12 hours     Discontinue  Reprint     05/03/20 1450    azithromycin (ZITHROMAX) 250 MG tablet  Daily     Discontinue  Reprint     05/03/20 1450           Kinnie Feil, PA-C 05/03/20 1452    Davonna Belling, MD 05/03/20 1524

## 2020-05-05 ENCOUNTER — Encounter: Payer: Self-pay | Admitting: Family Medicine

## 2020-05-10 ENCOUNTER — Ambulatory Visit (INDEPENDENT_AMBULATORY_CARE_PROVIDER_SITE_OTHER): Payer: Medicare Other | Admitting: Family Medicine

## 2020-05-10 ENCOUNTER — Encounter: Payer: Self-pay | Admitting: Family Medicine

## 2020-05-10 ENCOUNTER — Other Ambulatory Visit: Payer: Self-pay

## 2020-05-10 VITALS — BP 123/79 | HR 97 | Temp 97.5°F | Ht 64.0 in | Wt 192.0 lb

## 2020-05-10 DIAGNOSIS — R197 Diarrhea, unspecified: Secondary | ICD-10-CM

## 2020-05-10 DIAGNOSIS — J189 Pneumonia, unspecified organism: Secondary | ICD-10-CM

## 2020-05-10 NOTE — Patient Instructions (Addendum)
  Glad to hear that your symptoms are improving.  I expect diarrhea to also improve since you are off antibiotics.  If any new fevers, shortness of breath, worsening cough or other new/worsening symptoms return for recheck.  Otherwise follow-up in 1 month for repeat x-ray.  Thank you for coming in today and take care.    If you have lab work done today you will be contacted with your lab results within the next 2 weeks.  If you have not heard from [place] then please contact us. The fastest way to get your results is to register for My Chart.   IF you received an x-ray today, you will receive an invoice from [facility]. Please contact [facility] at [phone] with questions or concerns regarding your invoice.   IF you received labwork today, you will receive an invoice from [facility]. Please contact [facility] at [phone] with questions or concerns regarding your invoice.   Our billing staff will not be able to assist you with questions regarding bills from these companies.  You will be contacted with the lab results as soon as they are available. The fastest way to get your results is to activate your My Chart account. Instructions are located on the last page of this paperwork. If you have not heard from [place] regarding the results in 2 weeks, please contact this office.

## 2020-05-10 NOTE — Progress Notes (Signed)
Subjective:  Patient ID: Toni Williams, female    DOB: Nov 23, 1950  Age: 69 y.o. MRN: [id number]  CC:  Chief Complaint  Patient presents with  . Hospitalization Follow-up    pt went to the hospital on 05/03/2020. Pt was told she had pnomonia in her R lung. Pt reports she currently feels fine, but she dose have a dry cough every now and then. Pt isn't having any SOB and is fully vaccinated for COVID-19. Pt states she took a at home covid test and the results were negative. pt states she is having some dyirreha, but she thinks it's from the antibiotics that were given to her from the hospital. pt states she took thee last dose this morning.    HPI MICHEALE SCHLACK presents for   Right lower lobe community-acquired pneumonia ER visit July 5 reviewed from [facility].  New onset fever, T-max 102.7.  Mild cough initially.  Fully vaccinated for Covid.  Negative Covid testing.  Quit smoking 30 days prior.  No tape exposure or rash.  She is on methotrexate for eczema.  Mild right lower lobe infiltrate on chest x-ray, no leukocytosis.  Electrolytes normal.  Fever and tachycardia improved/resolved.  She was discharged on combination antibiotic therapy given history of immunosuppression with her methotrexate.  Treated with Augmentin 875 twice daily for 1 week, azithromycin Z-Pak.  DG Chest Port 1 View  Result Date: 05/03/2020 CLINICAL DATA:  Intermittent fevers.  Cough. EXAM: PORTABLE CHEST 1 VIEW COMPARISON:  02/22/2013 FINDINGS: Heart size is normal. There is minimal patchy opacity in the RIGHT LOWER lobe, consistent infiltrate. No frank consolidation. No pulmonary edema. No pleural effusions. IMPRESSION: RIGHT LOWER lobe infiltrate. Electronically Signed   By: Nolon Nations M.D.   On: 05/03/2020 13:01   Feeling better. No recent fevers, no shortness of breath. Slight residual dry cough, but improving.  Took delsym on occasion, few nights initially.  Has diarrhea 2 time per day.no  n/v. No blood in stool. No abd pain.  Last dose Augmentin this morning. Eating yogurt. Eating and drinking ok. Energy level improving.  Plans to rtw in 3 days.   History Patient Active Problem List   Diagnosis Date Noted  . Chest pain 05/17/2012  . Abnormal resting ECG findings 04/24/2012  . Vitamin D deficiency 04/24/2012  . Hypertension 02/11/2012  . Breast cancer ([place]) 02/11/2012  . Eczema 02/11/2012  . Allergic rhinitis due to allergen 02/11/2012   Past Medical History:  Diagnosis Date  . Anxiety    no meds  . Blood transfusion 1979   with missed abortion in [facility] [place]  . Cancer ([place])   . Hypertension   . Missed abortion    x 1  . Seasonal allergies   . Termination of pregnancy    x 1   Past Surgical History:  Procedure Laterality Date  . BREAST SURGERY  10/03/2007, 11/06/2007   Left breast excision, re-excision Left breast  . BREAST SURGERY     No IV sticks/Blood Draws/Blood Pressure Left Arm,  . CESAREAN SECTION    . CESAREAN SECTION N/A    Phreesia 04/30/2020  . CHOLECYSTECTOMY    . colonscopy    . DILATION AND CURETTAGE OF UTERUS    . HYSTEROSCOPY WITH D & C  10/27/2011   Procedure: DILATATION AND CURETTAGE /HYSTEROSCOPY;  Surgeon: Delice Lesch, MD;  Location: [facility];  Service: Gynecology;  Laterality: N/A;  . WISDOM TOOTH EXTRACTION  Allergies  Allergen Reactions  . Morphine And Related Other (See Comments)    "psychotic" reaction per pt report  . Shellfish Allergy Swelling  . Latex Rash    itching   Prior to Admission medications   Medication Sig Start Date End Date Taking? Authorizing Provider  amoxicillin-clavulanate (AUGMENTIN) 875-125 MG tablet Take 1 tablet by mouth every 12 (twelve) hours. 05/03/20  Yes Carmon Sails J, PA-C  azelastine (ASTELIN) 0.1 % nasal spray Place 1 spray into both nostrils 2 (two) times daily. Use in each nostril as directed 06/05/19  Yes Wendie Agreste, MD  azithromycin (ZITHROMAX) 250 MG tablet Take 1  tablet (250 mg total) by mouth daily. Take first 2 tablets together, then 1 every day until finished. 05/03/20  Yes Carmon Sails J, PA-C  fluticasone (FLONASE) 50 MCG/ACT nasal spray Place 1-2 sprays into both nostrils daily. 06/05/19  Yes Wendie Agreste, MD  hydrochlorothiazide (HYDRODIURIL) 25 MG tablet Take 1 tablet (25 mg total) by mouth daily. 02/09/20  Yes Wendie Agreste, MD  methotrexate 2.5 MG tablet Take 2.5 mg by mouth once a week.    Yes [provider]  cholecalciferol (VITAMIN D) 1000 UNITS tablet Take 2,000 Units by mouth daily.      [provider]   Social History   Socioeconomic History  . Marital status: Married    Spouse name: Not on file  . Number of children: Not on file  . Years of education: Not on file  . Highest education level: Not on file  Occupational History  . Not on file  Tobacco Use  . Smoking status: Former Smoker    Packs/day: 0.25    Years: 2.00    Pack years: 0.50    Types: Cigarettes    Quit date: 10/30/1976    Years since quitting: 43.5  . Smokeless tobacco: Never Used  Substance and Sexual Activity  . Alcohol use: No  . Drug use: No  . Sexual activity: Not Currently    Birth control/protection: None  Other Topics Concern  . Not on file  Social History Narrative  . Not on file   Social Determinants of Health   Financial Resource Strain:   . Difficulty of Paying Living Expenses:   Food Insecurity:   . Worried About Charity fundraiser in the Last Year:   . Arboriculturist in the Last Year:   Transportation Needs:   . Film/video editor (Medical):   Marland Kitchen Lack of Transportation (Non-Medical):   Physical Activity:   . Days of Exercise per Week:   . Minutes of Exercise per Session:   Stress:   . Feeling of Stress :   Social Connections:   . Frequency of Communication with Friends and Family:   . Frequency of Social Gatherings with Friends and Family:   . Attends Religious Services:   . Active Member of Clubs  or Organizations:   . Attends Archivist Meetings:   Marland Kitchen Marital Status:   Intimate Partner Violence:   . Fear of Current or Ex-Partner:   . Emotionally Abused:   Marland Kitchen Physically Abused:   . Sexually Abused:     Review of Systems Per HPI  Objective:   Vitals:   05/10/20 1108  BP: 123/79  Pulse: 97  Temp: (!) 97.5 F (36.4 C)  TempSrc: Temporal  SpO2: 97%  Weight: 192 lb (87.1 kg)  Height: 5\' 4"  (1.626 m)     Physical Exam Vitals  reviewed.  Constitutional:      General: She is not in acute distress.    Appearance: Normal appearance. She is well-developed. She is not toxic-appearing or diaphoretic.  HENT:     Head: Normocephalic and atraumatic.  Eyes:     Conjunctiva/sclera: Conjunctivae normal.     Pupils: Pupils are equal, round, and reactive to light.  Neck:     Vascular: No carotid bruit.  Cardiovascular:     Rate and Rhythm: Normal rate and regular rhythm.     Heart sounds: Normal heart sounds.  Pulmonary:     Effort: Pulmonary effort is normal. No respiratory distress.     Breath sounds: Normal breath sounds. No stridor. No wheezing, rhonchi or rales.  Abdominal:     Palpations: Abdomen is soft. There is no pulsatile mass.     Tenderness: There is no abdominal tenderness.  Skin:    General: Skin is warm and dry.  Neurological:     Mental Status: She is alert and oriented to person, place, and time.  Psychiatric:        Behavior: Behavior normal.        Assessment & Plan:  LIVELY HABERMAN is a 69 y.o. female . Pneumonia of right lower lobe due to infectious organism Diarrhea, unspecified type  -Pneumonia is clinically improving, lungs clear on exam, afebrile, reassuring vital signs.  -Diarrhea likely secondary to Augmentin, that has somewhat improved and anticipate continued improvement off antibiotics.  RTC precautions given, follow-up in 3 to 4 weeks for repeat chest x-ray.  Note provided for work, RTC/ER precautions  No orders of the  defined types were placed in this encounter.  Patient Instructions    Glad to hear that your symptoms are improving.  I expect diarrhea to also improve since you are off antibiotics.  If any new fevers, shortness of breath, worsening cough or other new/worsening symptoms return for recheck.  Otherwise follow-up in 1 month for repeat x-ray.  Thank you for coming in today and take care.    If you have lab work done today you will be contacted with your lab results within the next 2 weeks.  If you have not heard from [place] then please contact us. The fastest way to get your results is to register for My Chart.   IF you received an x-ray today, you will receive an invoice from [facility]. Please contact [facility] at [phone] with questions or concerns regarding your invoice.   IF you received labwork today, you will receive an invoice from [facility]. Please contact [facility] at [phone] with questions or concerns regarding your invoice.   Our billing staff will not be able to assist you with questions regarding bills from these companies.  You will be contacted with the lab results as soon as they are available. The fastest way to get your results is to activate your My Chart account. Instructions are located on the last page of this paperwork. If you have not heard from [place] regarding the results in 2 weeks, please contact this office.         Signed, Merri Ray, MD [facility]

## 2020-05-18 DIAGNOSIS — Z1231 Encounter for screening mammogram for malignant neoplasm of breast: Secondary | ICD-10-CM | POA: Diagnosis not present

## 2020-05-18 LAB — HM MAMMOGRAPHY

## 2020-05-27 ENCOUNTER — Other Ambulatory Visit: Payer: Self-pay | Admitting: Family Medicine

## 2020-05-27 DIAGNOSIS — R0981 Nasal congestion: Secondary | ICD-10-CM

## 2020-06-07 ENCOUNTER — Ambulatory Visit (INDEPENDENT_AMBULATORY_CARE_PROVIDER_SITE_OTHER): Payer: Medicare Other | Admitting: Family Medicine

## 2020-06-07 VITALS — BP 120/76 | HR 82 | Temp 97.9°F | Resp 16 | Ht 64.5 in | Wt 191.4 lb

## 2020-06-07 DIAGNOSIS — Z1211 Encounter for screening for malignant neoplasm of colon: Secondary | ICD-10-CM

## 2020-06-07 DIAGNOSIS — Z0000 Encounter for general adult medical examination without abnormal findings: Secondary | ICD-10-CM | POA: Diagnosis not present

## 2020-06-07 NOTE — Patient Instructions (Addendum)
Advance Directive  Advance directives are legal documents that let you make choices ahead of time about your health care and medical treatment in case you become unable to communicate for yourself. Advance directives are a way for you to make known your wishes to family, friends, and health care providers. This can let others know about your end-of-life care if you become unable to communicate. Discussing and writing advance directives should happen over time rather than all at once. Advance directives can be changed depending on your situation and what you want, even after you have signed the advance directives. There are different types of advance directives, such as:  Medical power of attorney.  Living will.  Do not resuscitate (DNR) or do not attempt resuscitation (DNAR) order. Health care proxy and medical power of attorney A health care proxy is also called a health care agent. This is a person who is appointed to make medical decisions for you in cases where you are unable to make the decisions yourself. Generally, people choose someone they know well and trust to represent their preferences. Make sure to ask this person for an agreement to act as your proxy. A proxy may have to exercise judgment in the event of a medical decision for which your wishes are not known. A medical power of attorney is a legal document that names your health care proxy. Depending on the laws in your state, after the document is written, it may also need to be:  Signed.  Notarized.  Dated.  Copied.  Witnessed.  Incorporated into your medical record. You may also want to appoint someone to manage your money in a situation in which you are unable to do so. This is called a durable power of attorney for finances. It is a separate legal document from the durable power of attorney for health care. You may choose the same person or someone different from your health care proxy to act as your agent in money  matters. If you do not appoint a proxy, or if there is a concern that the proxy is not acting in your best interests, a court may appoint a guardian to act on your behalf. Living will A living will is a set of instructions that state your wishes about medical care when you cannot express them yourself. Health care providers should keep a copy of your living will in your medical record. You may want to give a copy to family members or friends. To alert caregivers in case of an emergency, you can place a card in your wallet to let them know that you have a living will and where they can find it. A living will is used if you become:  Terminally ill.  Disabled.  Unable to communicate or make decisions. Items to consider in your living will include:  To use or not to use life-support equipment, such as dialysis machines and breathing machines (ventilators).  A DNR or DNAR order. This tells health care providers not to use cardiopulmonary resuscitation (CPR) if breathing or heartbeat stops.  To use or not to use tube feeding.  To be given or not to be given food and fluids.  Comfort (palliative) care when the goal becomes comfort rather than a cure.  Donation of organs and tissues. A living will does not give instructions for distributing your money and property if you should pass away. DNR or DNAR A DNR or DNAR order is a request not to have CPR in the event that   your heart stops beating or you stop breathing. If a DNR or DNAR order has not been made and shared, a health care provider will try to help any patient whose heart has stopped or who has stopped breathing. If you plan to have surgery, talk with your health care provider about how your DNR or DNAR order will be followed if problems occur. What if I do not have an advance directive? If you do not have an advance directive, some states assign family decision makers to act on your behalf based on how closely you are related to them. Each  state has its own laws about advance directives. You may want to check with your health care provider, attorney, or state representative about the laws in your state. Summary  Advance directives are the legal documents that allow you to make choices ahead of time about your health care and medical treatment in case you become unable to tell others about your care.  The process of discussing and writing advance directives should happen over time. You can change the advance directives, even after you have signed them.  Advance directives include DNR or DNAR orders, living wills, and designating an agent as your medical power of attorney. This information is not intended to replace advice given to you by your health care provider. Make sure you discuss any questions you have with your health care provider. Document Revised: 05/15/2019 Document Reviewed: 05/15/2019 Elsevier Patient Education  2020 Elsevier Inc. Preventive Care 65 Years and Older, Female Preventive care refers to lifestyle choices and visits with your health care provider that can promote health and wellness. This includes:  A yearly physical exam. This is also called an annual well check.  Regular dental and eye exams.  Immunizations.  Screening for certain conditions.  Healthy lifestyle choices, such as diet and exercise. What can I expect for my preventive care visit? Physical exam Your health care provider will check:  Height and weight. These may be used to calculate body mass index (BMI), which is a measurement that tells if you are at a healthy weight.  Heart rate and blood pressure.  Your skin for abnormal spots. Counseling Your health care provider may ask you questions about:  Alcohol, tobacco, and drug use.  Emotional well-being.  Home and relationship well-being.  Sexual activity.  Eating habits.  History of falls.  Memory and ability to understand (cognition).  Work and work environment.   Pregnancy and menstrual history. What immunizations do I need?  Influenza (flu) vaccine  This is recommended every year. Tetanus, diphtheria, and pertussis (Tdap) vaccine  You may need a Td booster every 10 years. Varicella (chickenpox) vaccine  You may need this vaccine if you have not already been vaccinated. Zoster (shingles) vaccine  You may need this after age 60. Pneumococcal conjugate (PCV13) vaccine  One dose is recommended after age 65. Pneumococcal polysaccharide (PPSV23) vaccine  One dose is recommended after age 65. Measles, mumps, and rubella (MMR) vaccine  You may need at least one dose of MMR if you were born in 1957 or later. You may also need a second dose. Meningococcal conjugate (MenACWY) vaccine  You may need this if you have certain conditions. Hepatitis A vaccine  You may need this if you have certain conditions or if you travel or work in places where you may be exposed to hepatitis A. Hepatitis B vaccine  You may need this if you have certain conditions or if you travel or work in   places where you may be exposed to hepatitis B. Haemophilus influenzae type b (Hib) vaccine  You may need this if you have certain conditions. You may receive vaccines as individual doses or as more than one vaccine together in one shot (combination vaccines). Talk with your health care provider about the risks and benefits of combination vaccines. What tests do I need? Blood tests  Lipid and cholesterol levels. These may be checked every 5 years, or more frequently depending on your overall health.  Hepatitis C test.  Hepatitis B test. Screening  Lung cancer screening. You may have this screening every year starting at age 55 if you have a 30-pack-year history of smoking and currently smoke or have quit within the past 15 years.  Colorectal cancer screening. All adults should have this screening starting at age 50 and continuing until age 75. Your health care  provider may recommend screening at age 45 if you are at increased risk. You will have tests every 1-10 years, depending on your results and the type of screening test.  Diabetes screening. This is done by checking your blood sugar (glucose) after you have not eaten for a while (fasting). You may have this done every 1-3 years.  Mammogram. This may be done every 1-2 years. Talk with your health care provider about how often you should have regular mammograms.  BRCA-related cancer screening. This may be done if you have a family history of breast, ovarian, tubal, or peritoneal cancers. Other tests  Sexually transmitted disease (STD) testing.  Bone density scan. This is done to screen for osteoporosis. You may have this done starting at age 65. Follow these instructions at home: Eating and drinking  Eat a diet that includes fresh fruits and vegetables, whole grains, lean protein, and low-fat dairy products. Limit your intake of foods with high amounts of sugar, saturated fats, and salt.  Take vitamin and mineral supplements as recommended by your health care provider.  Do not drink alcohol if your health care provider tells you not to drink.  If you drink alcohol: ? Limit how much you have to 0-1 drink a day. ? Be aware of how much alcohol is in your drink. In the [place] one drink equals one 12 oz bottle of beer (355 mL), one 5 oz glass of wine (148 mL), or one 1 oz glass of hard liquor (44 mL). Lifestyle  Take daily care of your teeth and gums.  Stay active. Exercise for at least 30 minutes on 5 or more days each week.  Do not use any products that contain nicotine or tobacco, such as cigarettes, e-cigarettes, and chewing tobacco. If you need help quitting, ask your health care provider.  If you are sexually active, practice safe sex. Use a condom or other form of protection in order to prevent STIs (sexually transmitted infections).  Talk with your health care provider about taking  a low-dose aspirin or statin. What's next?  Go to your health care provider once a year for a well check visit.  Ask your health care provider how often you should have your eyes and teeth checked.  Stay up to date on all vaccines. This information is not intended to replace advice given to you by your health care provider. Make sure you discuss any questions you have with your health care provider. Document Revised: 10/10/2018 Document Reviewed: 10/10/2018 Elsevier Patient Education  2020 Elsevier Inc.  

## 2020-06-07 NOTE — Progress Notes (Signed)
Presents today for [employer] Visit   Date of last exam:   Interpreter used for this visit? No  I connected with  Toni Williams on 06/07/20 in person at [facility]ified that I am speaking with the correct person using two identifiers.     Patient location: Clinic  Provider location: in office  I provided 30 minutes  face - to - face time during this encounter.  Patient Care Team: Wendie Agreste, MD as PCP - General (Family Medicine) Sueanne Margarita, MD as PCP - Cardiology (Cardiology) Kennith Center, RD as Dietitian (Family Medicine) Jari Pigg, MD as Consulting Physician (Dermatology) Everett Graff, MD as Consulting Physician (Obstetrics and Gynecology)   Other items to address today:  Discussed immunizations Discussed Eye/Dental Follow up scheduled Dr. Carlota Raspberry 8-12 @ 10:20 for Pneumonia     Other Screening: Last screening for diabetes: 03-12-2020 Last lipid screening:03-12-2020   ADVANCE DIRECTIVES: Discussed: yes On File: no Materials Provided: yes  Immunization status:  Immunization History  Administered Date(s) Administered   PFIZER SARS-COV-2 Vaccination 11/17/2018, 12/09/2019   Td 01/18/2005     Health Maintenance Due  Topic Date Due   COLONOSCOPY  Never done   TETANUS/TDAP  01/19/2015   PNA vac Low Risk Adult (1 of 2 - PCV13) Never done   INFLUENZA VACCINE  05/30/2020     Functional Status Survey: Is the patient deaf or have difficulty hearing?: No Does the patient have difficulty seeing, even when wearing glasses/contacts?: No Does the patient have difficulty concentrating, remembering, or making decisions?: No Does the patient have difficulty walking or climbing stairs?: No Does the patient have difficulty dressing or bathing?: No Does the patient have difficulty doing errands alone such as visiting a doctor's office or shopping?: No   6CIT Screen 06/07/2020 06/05/2019  What Year? 0 points 0 points  What  month? 0 points 0 points  What time? 0 points 0 points  Count back from 20 0 points 0 points  Months in reverse 0 points 0 points  Repeat phrase 0 points 0 points  Total Score 0 0        Clinical Support from 06/07/2020 in [facility] at [facility]  AUDIT-C Score 3       Home Environment   No trouble climbing stairs Live in two story home No scattered rugs No grab bars Adequate lighting / no clutter  Timed warm up 30  Seconds  Breakfast: eggs/chicken sausage Lunch : Salad   Dinner:   No pork/ chicken air fried/baked   [place] (gound)    Fish  Caffeine: alternate days with caffine and without      Patient Active Problem List   Diagnosis Date Noted   Chest pain 05/17/2012   Abnormal resting ECG findings 04/24/2012   Vitamin D deficiency 04/24/2012   Hypertension 02/11/2012   Breast cancer ([place]) 02/11/2012   Eczema 02/11/2012   Allergic rhinitis due to allergen 02/11/2012     Past Medical History:  Diagnosis Date   Anxiety    no meds   Blood transfusion 1979   with missed abortion in [place] -Dr Heber Steele   Cancer [facility])    Hypertension    Missed abortion    x 1   Seasonal allergies    Termination of pregnancy    x 1     Past Surgical History:  Procedure Laterality Date   BREAST SURGERY  10/03/2007, 11/06/2007   Left breast excision, re-excision Left  breast   BREAST SURGERY     No IV sticks/Blood Draws/Blood Pressure Left Arm,   CESAREAN SECTION     CESAREAN SECTION N/A    [employer] 04/30/2020   CHOLECYSTECTOMY     colonscopy     DILATION AND CURETTAGE OF UTERUS     HYSTEROSCOPY WITH D & C  10/27/2011   Procedure: DILATATION AND CURETTAGE /HYSTEROSCOPY;  Surgeon: Delice Lesch, MD;  Location: [facility];  Service: Gynecology;  Laterality: N/A;   WISDOM TOOTH EXTRACTION       Family History  Problem Relation Age of Onset   Cancer Mother 76       breast (R),stomach, and uterine   Cancer Father 3       lung from smoking   3 pks/day   Diabetes Maternal Grandmother        amputation of (L) leg   Stroke Maternal Grandfather 92   Diabetes Brother      Social History   Socioeconomic History   Marital status: Married    Spouse name: Not on file   Number of children: Not on file   Years of education: Not on file   Highest education level: Not on file  Occupational History   Not on file  Tobacco Use   Smoking status: Former Smoker    Packs/day: 0.25    Years: 2.00    Pack years: 0.50    Types: Cigarettes    Quit date: 10/30/1976    Years since quitting: 43.6   Smokeless tobacco: Never Used  Substance and Sexual Activity   Alcohol use: No   Drug use: No   Sexual activity: Not Currently    Birth control/protection: None  Other Topics Concern   Not on file  Social History Narrative   Not on file   Social Determinants of Health   Financial Resource Strain:    Difficulty of Paying Living Expenses:   Food Insecurity:    Worried About Charity fundraiser in the Last Year:    Arboriculturist in the Last Year:   Transportation Needs:    Film/video editor (Medical):    Lack of Transportation (Non-Medical):   Physical Activity:    Days of Exercise per Week:    Minutes of Exercise per Session:   Stress:    Feeling of Stress :   Social Connections:    Frequency of Communication with Friends and Family:    Frequency of Social Gatherings with Friends and Family:    Attends Religious Services:    Active Member of Clubs or Organizations:    Attends Archivist Meetings:    Marital Status:   Intimate Partner Violence:    Fear of Current or Ex-Partner:    Emotionally Abused:    Physically Abused:    Sexually Abused:      Allergies  Allergen Reactions   Morphine And Related Other (See Comments)    "psychotic" reaction per pt report   Shellfish Allergy Swelling   Latex Rash    itching     Prior to Admission medications   Medication Sig  Start Date End Date Taking? Authorizing Provider  azelastine (ASTELIN) 0.1 % nasal spray Place 1 spray into both nostrils 2 (two) times daily. Use in each nostril as directed 06/05/19  Yes Wendie Agreste, MD  fluticasone (FLONASE) 50 MCG/ACT nasal spray PLACE 1-2 SPRAYS INTO BOTH NOSTRILS DAILY. 05/27/20  Yes Wendie Agreste, MD  hydrochlorothiazide (HYDRODIURIL) 25  MG tablet Take 1 tablet (25 mg total) by mouth daily. 02/09/20  Yes Wendie Agreste, MD  amoxicillin-clavulanate (AUGMENTIN) 875-125 MG tablet Take 1 tablet by mouth every 12 (twelve) hours. 05/03/20   Kinnie Feil, PA-C  azithromycin (ZITHROMAX) 250 MG tablet Take 1 tablet (250 mg total) by mouth daily. Take first 2 tablets together, then 1 every day until finished. 05/03/20   Kinnie Feil, PA-C     Depression screen [facility] 2/9 06/07/2020 05/10/2020 04/30/2020 02/09/2020 06/05/2019  Decreased Interest 0 0 0 0 0  Down, Depressed, Hopeless 0 0 0 0 0  PHQ - 2 Score 0 0 0 0 0     Fall Risk  06/07/2020 05/10/2020 04/30/2020 02/09/2020 06/05/2019  Falls in the past year? 0 0 0 0 0  Comment - - - - -  Number falls in past yr: 0 - 0 - 0  Injury with Fall? 0 - 0 - 0  Follow up Falls evaluation completed;Education provided Falls evaluation completed Falls evaluation completed Falls evaluation completed Falls evaluation completed      PHYSICAL EXAM: BP 120/76 (BP Location: Right Arm, Patient Position: Sitting, Cuff Size: Normal)    Pulse 82    Temp 97.9 F (36.6 C) (Temporal)    Resp 16    Ht 5' 4.5" (1.638 m)    Wt 191 lb 6.4 oz (86.8 kg)    SpO2 95%    BMI 32.35 kg/m    Wt Readings from Last 3 Encounters:  06/07/20 191 lb 6.4 oz (86.8 kg)  05/10/20 192 lb (87.1 kg)  05/03/20 192 lb 12.8 oz (87.5 kg)      Education/Counseling provided regarding diet and exercise, prevention of chronic diseases, smoking/tobacco cessation, if applicable, and reviewed "Covered Medicare Preventive Services."

## 2020-06-10 ENCOUNTER — Ambulatory Visit (INDEPENDENT_AMBULATORY_CARE_PROVIDER_SITE_OTHER): Payer: Medicare Other | Admitting: Family Medicine

## 2020-06-10 ENCOUNTER — Ambulatory Visit (INDEPENDENT_AMBULATORY_CARE_PROVIDER_SITE_OTHER): Payer: Medicare Other

## 2020-06-10 ENCOUNTER — Other Ambulatory Visit: Payer: Self-pay

## 2020-06-10 ENCOUNTER — Encounter: Payer: Self-pay | Admitting: Family Medicine

## 2020-06-10 VITALS — BP 111/73 | HR 78 | Temp 97.8°F | Resp 15 | Ht 64.5 in | Wt 192.4 lb

## 2020-06-10 DIAGNOSIS — J189 Pneumonia, unspecified organism: Secondary | ICD-10-CM

## 2020-06-10 DIAGNOSIS — M549 Dorsalgia, unspecified: Secondary | ICD-10-CM | POA: Diagnosis not present

## 2020-06-10 DIAGNOSIS — Z1211 Encounter for screening for malignant neoplasm of colon: Secondary | ICD-10-CM

## 2020-06-10 NOTE — Progress Notes (Signed)
Subjective:  Patient ID: Toni Williams, female    DOB: 1951-05-29  Age: 69 y.o. MRN: [id number]  CC:  Chief Complaint  Patient presents with  . Pneumonia    pt had pneumonia of lower Rt lobe, pt notes she is not having coughing or any chest pain feels well at this time, [place] maintenance says she needs imm for pneumonia would like to discuss this     HPI Toni Williams presents for   Right lower lobe community-acquired pneumonia Evaluated in ER July 5.  Right lower lobe infiltrate noted on chest x-ray with no leukocytosis on CBC, electrolytes are normal.  Fever and tachycardia improved/resolved with IV fluids.  Discharged on Augmentin, azithromycin.  Some diarrhea last visit but was on her last day of Augmentin, symptomatically improved.  Feels well. Diarrhea resolved, no further fever, cough or dyspnea.  Energy level doing well.   Requested Cologuard, not sure it has been ordered.  Had wellness exam August 9.  R low back pain: Past month. No fall/injury. No prior back issue/surgery. No night sweats, fever, weight loss.  No bowel or bladder incontinence, no saddle anesthesia, no lower extremity weakness.  Pain radiates to thigh at times. Better with motrin - taken twice, massage with tennis ball on lower back also helped.    Question about jury duty: Scheduled for Madaline Savage duty August 30th.  Concerned about anxiety and wearing mask for long period of time. No current meds for anxiety - takes otc "Rescue Remedy" once every 2 months when anxious.   Has been vaccinated against Covid.   History Patient Active Problem List   Diagnosis Date Noted  . Chest pain 05/17/2012  . Abnormal resting ECG findings 04/24/2012  . Vitamin D deficiency 04/24/2012  . Hypertension 02/11/2012  . Breast cancer ([place]) 02/11/2012  . Eczema 02/11/2012  . Allergic rhinitis due to allergen 02/11/2012   Past Medical History:  Diagnosis Date  . Anxiety    no meds  . Blood transfusion 1979     with missed abortion in [facility] -Dr Heber Franklin  . Cancer ([place])   . Hypertension   . Missed abortion    x 1  . Seasonal allergies   . Termination of pregnancy    x 1   Past Surgical History:  Procedure Laterality Date  . BREAST SURGERY  10/03/2007, 11/06/2007   Left breast excision, re-excision Left breast  . BREAST SURGERY     No IV sticks/Blood Draws/Blood Pressure Left Arm,  . CESAREAN SECTION    . CESAREAN SECTION N/A    Phreesia 04/30/2020  . CHOLECYSTECTOMY    . colonscopy    . DILATION AND CURETTAGE OF UTERUS    . HYSTEROSCOPY WITH D & C  10/27/2011   Procedure: DILATATION AND CURETTAGE /HYSTEROSCOPY;  Surgeon: Delice Lesch, MD;  Location: [facility];  Service: Gynecology;  Laterality: N/A;  . WISDOM TOOTH EXTRACTION     Allergies  Allergen Reactions  . Morphine And Related Other (See Comments)    "psychotic" reaction per pt report  . Shellfish Allergy Swelling  . Latex Rash    itching   Prior to Admission medications   Medication Sig Start Date End Date Taking? Authorizing Provider  azelastine (ASTELIN) 0.1 % nasal spray Place 1 spray into both nostrils 2 (two) times daily. Use in each nostril as directed 06/05/19   Wendie Agreste, MD  fluticasone (FLONASE) 50 MCG/ACT nasal spray PLACE 1-2 SPRAYS INTO BOTH NOSTRILS DAILY. 05/27/20  Wendie Agreste, MD  hydrochlorothiazide (HYDRODIURIL) 25 MG tablet Take 1 tablet (25 mg total) by mouth daily. 02/09/20   Wendie Agreste, MD   Social History   Socioeconomic History  . Marital status: Married    Spouse name: Not on file  . Number of children: Not on file  . Years of education: Not on file  . Highest education level: Not on file  Occupational History  . Not on file  Tobacco Use  . Smoking status: Former Smoker    Packs/day: 0.25    Years: 2.00    Pack years: 0.50    Types: Cigarettes    Quit date: 10/30/1976    Years since quitting: 43.6  . Smokeless tobacco: Never Used  Substance and Sexual Activity  .  Alcohol use: No  . Drug use: No  . Sexual activity: Not Currently    Birth control/protection: None  Other Topics Concern  . Not on file  Social History Narrative  . Not on file   Social Determinants of Health   Financial Resource Strain:   . Difficulty of Paying Living Expenses:   Food Insecurity:   . Worried About Charity fundraiser in the Last Year:   . Arboriculturist in the Last Year:   Transportation Needs:   . Film/video editor (Medical):   Marland Kitchen Lack of Transportation (Non-Medical):   Physical Activity:   . Days of Exercise per Week:   . Minutes of Exercise per Session:   Stress:   . Feeling of Stress :   Social Connections:   . Frequency of Communication with Friends and Family:   . Frequency of Social Gatherings with Friends and Family:   . Attends Religious Services:   . Active Member of Clubs or Organizations:   . Attends Archivist Meetings:   Marland Kitchen Marital Status:   Intimate Partner Violence:   . Fear of Current or Ex-Partner:   . Emotionally Abused:   Marland Kitchen Physically Abused:   . Sexually Abused:     Review of Systems   Objective:   Vitals:   06/10/20 1041  BP: 111/73  Pulse: 78  Resp: 15  Temp: 97.8 F (36.6 C)  TempSrc: Temporal  SpO2: 97%  Weight: 192 lb 6.4 oz (87.3 kg)  Height: 5' 4.5" (1.638 m)     Physical Exam Vitals reviewed.  Constitutional:      Appearance: She is well-developed.  HENT:     Head: Normocephalic and atraumatic.  Eyes:     Conjunctiva/sclera: Conjunctivae normal.     Pupils: Pupils are equal, round, and reactive to light.  Neck:     Vascular: No carotid bruit.  Cardiovascular:     Rate and Rhythm: Normal rate and regular rhythm.     Heart sounds: Normal heart sounds.  Pulmonary:     Effort: Pulmonary effort is normal.     Breath sounds: Normal breath sounds.  Abdominal:     Palpations: Abdomen is soft. There is no pulsatile mass.     Tenderness: There is no abdominal tenderness.  Musculoskeletal:       Comments: No focal bony tenderness or paraspinal tenderness of lumbar spine.  Negative seated straight leg raise, pain-free hip range of motion, thigh nontender.  Reflexes 2+ at patella and Achilles bilaterally.   Skin:    General: Skin is warm and dry.  Neurological:     Mental Status: She is alert and oriented to person, place, and  time.  Psychiatric:        Behavior: Behavior normal.   40 minutes spent during visit, greater than 50% counseling and assimilation of information, chart review, and discussion of plan.    Assessment & Plan:  Toni Williams is a 69 y.o. female . Pneumonia of right lower lobe due to infectious organism - Plan: DG Chest 2 View  -Clinically, radiographically improved.  Pneumonia vaccine recommended, can start with Prevnar, followed by Pneumovax minimum of 8 weeks later.  If she decides only one pneumonia vaccine, then would give Pneumovax.  2-month waiting period discussed after recent pneumonia.  - Discussed concerns with jury duty.  Letter was provided, but if she does have to attend, mask adherence discussed and has been vaccinated.  Also discussed use of max prior to become more used to wearing it for more prolonged times.  Follow-up if persistent or worsening anxiety.   Special screening for malignant neoplasms, colon - Plan: Cologuard  - Screening options with colonoscopy versus Cologuard discussed. Discussed timing of repeat testing intervals if normal, as well as potential need for diagnostic Colonoscopy if positive Cologuard. Understanding expressed, and chose Cologuard.   Back pain without radiation  -Reassuring exam, possible flare of back pain with decreased activity with recent pneumonia.  Symptomatic care with RTC precautions, no apparent red flags on exam or history, will defer imaging at this time  No orders of the defined types were placed in this encounter.  Patient Instructions    I am glad to hear that you are feeling better.  I do  recommend pneumonia vaccination, but that should be delayed for 1 more month since you recently had pneumonia. I would recommend Prenvnar first, then Pneumovax, minimum of 8 weeks apart. If you decide to only get one of these vaccines, then you should get Pneumovax.   Back pain may have been related to less activity with recent pneumonia.  See information below.  Tylenol over-the-counter, massage is ok,  range of motion and stretches are okay for now.  If those symptoms are not improving in the next few weeks or any worsening symptoms, return for recheck and possible x-ray.  I did complete a letter for jury duty, but if that does not excuse, you, I am ok with you serving if you can continue to wear a mask. Try different masks for comfort and try to use more frequently to get used to wearing it.   Return to the clinic or go to the nearest emergency room if any of your symptoms worsen or new symptoms occur.   Acute Back Pain, Adult Acute back pain is sudden and usually short-lived. It is often caused by an injury to the muscles and tissues in the back. The injury may result from:  A muscle or ligament getting overstretched or torn (strained). Ligaments are tissues that connect bones to each other. Lifting something improperly can cause a back strain.  Wear and tear (degeneration) of the spinal disks. Spinal disks are circular tissue that provides cushioning between the bones of the spine (vertebrae).  Twisting motions, such as while playing sports or doing yard work.  A hit to the back.  Arthritis. You may have a physical exam, lab tests, and imaging tests to find the cause of your pain. Acute back pain usually goes away with rest and home care. Follow these instructions at home: Managing pain, stiffness, and swelling  Take over-the-counter and prescription medicines only as told by your health care provider.  Your health care provider may recommend applying ice during the first 24-48 hours  after your pain starts. To do this: ? Put ice in a plastic bag. ? Place a towel between your skin and the bag. ? Leave the ice on for 20 minutes, 2-3 times a day.  If directed, apply heat to the affected area as often as told by your health care provider. Use the heat source that your health care provider recommends, such as a moist heat pack or a heating pad. ? Place a towel between your skin and the heat source. ? Leave the heat on for 20-30 minutes. ? Remove the heat if your skin turns bright red. This is especially important if you are unable to feel pain, heat, or cold. You have a greater risk of getting burned. Activity   Do not stay in bed. Staying in bed for more than 1-2 days can delay your recovery.  Sit up and stand up straight. Avoid leaning forward when you sit, or hunching over when you stand. ? If you work at a desk, sit close to it so you do not need to lean over. Keep your chin tucked in. Keep your neck drawn back, and keep your elbows bent at a right angle. Your arms should look like the letter "L." ? Sit high and close to the steering wheel when you drive. Add lower back (lumbar) support to your car seat, if needed.  Take short walks on even surfaces as soon as you are able. Try to increase the length of time you walk each day.  Do not sit, drive, or stand in one place for more than 30 minutes at a time. Sitting or standing for long periods of time can put stress on your back.  Do not drive or use heavy machinery while taking prescription pain medicine.  Use proper lifting techniques. When you bend and lift, use positions that put less stress on your back: ? [place] your knees. ? Keep the load close to your body. ? Avoid twisting.  Exercise regularly as told by your health care provider. Exercising helps your back heal faster and helps prevent back injuries by keeping muscles strong and flexible.  Work with a physical therapist to make a safe exercise program, as  recommended by your health care provider. Do any exercises as told by your physical therapist. Lifestyle  Maintain a healthy weight. Extra weight puts stress on your back and makes it difficult to have good posture.  Avoid activities or situations that make you feel anxious or stressed. Stress and anxiety increase muscle tension and can make back pain worse. Learn ways to manage anxiety and stress, such as through exercise. General instructions  Sleep on a firm mattress in a comfortable position. Try lying on your side with your knees slightly bent. If you lie on your back, put a pillow under your knees.  Follow your treatment plan as told by your health care provider. This may include: ? Cognitive or behavioral therapy. ? Acupuncture or massage therapy. ? Meditation or yoga. Contact a health care provider if:  You have pain that is not relieved with rest or medicine.  You have increasing pain going down into your legs or buttocks.  Your pain does not improve after 2 weeks.  You have pain at night.  You lose weight without trying.  You have a fever or chills. Get help right away if:  You develop new bowel or bladder control problems.  You have  unusual weakness or numbness in your arms or legs.  You develop nausea or vomiting.  You develop abdominal pain.  You feel faint. Summary  Acute back pain is sudden and usually short-lived.  Use proper lifting techniques. When you bend and lift, use positions that put less stress on your back.  Take over-the-counter and prescription medicines and apply heat or ice as directed by your health care provider. This information is not intended to replace advice given to you by your health care provider. Make sure you discuss any questions you have with your health care provider. Document Revised: 02/04/2019 Document Reviewed: 05/30/2017 Elsevier Patient Education  [employer].   If you have lab work done today you will be  contacted with your lab results within the next 2 weeks.  If you have not heard from [place] then please contact us. The fastest way to get your results is to register for My Chart.   IF you received an x-ray today, you will receive an invoice from [facility]. Please contact [facility] at [phone] with questions or concerns regarding your invoice.   IF you received labwork today, you will receive an invoice from [facility]. Please contact [facility] at [phone] with questions or concerns regarding your invoice.   Our billing staff will not be able to assist you with questions regarding bills from these companies.  You will be contacted with the lab results as soon as they are available. The fastest way to get your results is to activate your My Chart account. Instructions are located on the last page of this paperwork. If you have not heard from [place] regarding the results in 2 weeks, please contact this office.         Signed, Merri Ray, MD [facility]

## 2020-06-10 NOTE — Patient Instructions (Addendum)
I am glad to hear that you are feeling better.  I do recommend pneumonia vaccination, but that should be delayed for 1 more month since you recently had pneumonia. I would recommend Prenvnar first, then Pneumovax, minimum of 8 weeks apart. If you decide to only get one of these vaccines, then you should get Pneumovax.   Back pain may have been related to less activity with recent pneumonia.  See information below.  Tylenol over-the-counter, massage is ok,  range of motion and stretches are okay for now.  If those symptoms are not improving in the next few weeks or any worsening symptoms, return for recheck and possible x-ray.  I did complete a letter for jury duty, but if that does not excuse, you, I am ok with you serving if you can continue to wear a mask. Try different masks for comfort and try to use more frequently to get used to wearing it.   Return to the clinic or go to the nearest emergency room if any of your symptoms worsen or new symptoms occur.   Acute Back Pain, Adult Acute back pain is sudden and usually short-lived. It is often caused by an injury to the muscles and tissues in the back. The injury may result from:  A muscle or ligament getting overstretched or torn (strained). Ligaments are tissues that connect bones to each other. Lifting something improperly can cause a back strain.  Wear and tear (degeneration) of the spinal disks. Spinal disks are circular tissue that provides cushioning between the bones of the spine (vertebrae).  Twisting motions, such as while playing sports or doing yard work.  A hit to the back.  Arthritis. You may have a physical exam, lab tests, and imaging tests to find the cause of your pain. Acute back pain usually goes away with rest and home care. Follow these instructions at home: Managing pain, stiffness, and swelling  Take over-the-counter and prescription medicines only as told by your health care provider.  Your health care provider  may recommend applying ice during the first 24-48 hours after your pain starts. To do this: ? Put ice in a plastic bag. ? Place a towel between your skin and the bag. ? Leave the ice on for 20 minutes, 2-3 times a day.  If directed, apply heat to the affected area as often as told by your health care provider. Use the heat source that your health care provider recommends, such as a moist heat pack or a heating pad. ? Place a towel between your skin and the heat source. ? Leave the heat on for 20-30 minutes. ? Remove the heat if your skin turns bright red. This is especially important if you are unable to feel pain, heat, or cold. You have a greater risk of getting burned. Activity   Do not stay in bed. Staying in bed for more than 1-2 days can delay your recovery.  Sit up and stand up straight. Avoid leaning forward when you sit, or hunching over when you stand. ? If you work at a desk, sit close to it so you do not need to lean over. Keep your chin tucked in. Keep your neck drawn back, and keep your elbows bent at a right angle. Your arms should look like the letter "L." ? Sit high and close to the steering wheel when you drive. Add lower back (lumbar) support to your car seat, if needed.  Take short walks on even surfaces as soon as you  are able. Try to increase the length of time you walk each day.  Do not sit, drive, or stand in one place for more than 30 minutes at a time. Sitting or standing for long periods of time can put stress on your back.  Do not drive or use heavy machinery while taking prescription pain medicine.  Use proper lifting techniques. When you bend and lift, use positions that put less stress on your back: ? [place] your knees. ? Keep the load close to your body. ? Avoid twisting.  Exercise regularly as told by your health care provider. Exercising helps your back heal faster and helps prevent back injuries by keeping muscles strong and flexible.  Work with a  physical therapist to make a safe exercise program, as recommended by your health care provider. Do any exercises as told by your physical therapist. Lifestyle  Maintain a healthy weight. Extra weight puts stress on your back and makes it difficult to have good posture.  Avoid activities or situations that make you feel anxious or stressed. Stress and anxiety increase muscle tension and can make back pain worse. Learn ways to manage anxiety and stress, such as through exercise. General instructions  Sleep on a firm mattress in a comfortable position. Try lying on your side with your knees slightly bent. If you lie on your back, put a pillow under your knees.  Follow your treatment plan as told by your health care provider. This may include: ? Cognitive or behavioral therapy. ? Acupuncture or massage therapy. ? Meditation or yoga. Contact a health care provider if:  You have pain that is not relieved with rest or medicine.  You have increasing pain going down into your legs or buttocks.  Your pain does not improve after 2 weeks.  You have pain at night.  You lose weight without trying.  You have a fever or chills. Get help right away if:  You develop new bowel or bladder control problems.  You have unusual weakness or numbness in your arms or legs.  You develop nausea or vomiting.  You develop abdominal pain.  You feel faint. Summary  Acute back pain is sudden and usually short-lived.  Use proper lifting techniques. When you bend and lift, use positions that put less stress on your back.  Take over-the-counter and prescription medicines and apply heat or ice as directed by your health care provider. This information is not intended to replace advice given to you by your health care provider. Make sure you discuss any questions you have with your health care provider. Document Revised: 02/04/2019 Document Reviewed: 05/30/2017 Elsevier Patient Education  [employer].   If you have lab work done today you will be contacted with your lab results within the next 2 weeks.  If you have not heard from [place] then please contact us. The fastest way to get your results is to register for My Chart.   IF you received an x-ray today, you will receive an invoice from [facility]. Please contact [facility] at [phone] with questions or concerns regarding your invoice.   IF you received labwork today, you will receive an invoice from [place]. Please contact [facility] at [phone] with questions or concerns regarding your invoice.   Our billing staff will not be able to assist you with questions regarding bills from these companies.  You will be contacted with the lab results as soon as they are available. The fastest way to get your results is  to activate your My Chart account. Instructions are located on the last page of this paperwork. If you have not heard from [place] regarding the results in 2 weeks, please contact this office.

## 2020-06-23 DIAGNOSIS — Z1211 Encounter for screening for malignant neoplasm of colon: Secondary | ICD-10-CM | POA: Diagnosis not present

## 2020-06-29 LAB — COLOGUARD: COLOGUARD: NEGATIVE

## 2020-06-30 LAB — COLOGUARD: Cologuard: NEGATIVE

## 2020-07-09 LAB — COLOGUARD: Cologuard: NEGATIVE

## 2020-07-13 ENCOUNTER — Other Ambulatory Visit: Payer: Self-pay

## 2020-07-13 ENCOUNTER — Ambulatory Visit (INDEPENDENT_AMBULATORY_CARE_PROVIDER_SITE_OTHER): Payer: Medicare Other | Admitting: Family Medicine

## 2020-07-13 DIAGNOSIS — Z23 Encounter for immunization: Secondary | ICD-10-CM

## 2020-07-22 DIAGNOSIS — L2084 Intrinsic (allergic) eczema: Secondary | ICD-10-CM | POA: Diagnosis not present

## 2020-07-22 DIAGNOSIS — L810 Postinflammatory hyperpigmentation: Secondary | ICD-10-CM | POA: Diagnosis not present

## 2020-11-11 ENCOUNTER — Other Ambulatory Visit: Payer: Self-pay | Admitting: Family Medicine

## 2020-11-11 DIAGNOSIS — I10 Essential (primary) hypertension: Secondary | ICD-10-CM

## 2020-12-09 DIAGNOSIS — H5213 Myopia, bilateral: Secondary | ICD-10-CM | POA: Diagnosis not present

## 2020-12-30 DIAGNOSIS — H43813 Vitreous degeneration, bilateral: Secondary | ICD-10-CM | POA: Diagnosis not present

## 2021-01-10 DIAGNOSIS — H2512 Age-related nuclear cataract, left eye: Secondary | ICD-10-CM | POA: Diagnosis not present

## 2021-01-10 DIAGNOSIS — H40052 Ocular hypertension, left eye: Secondary | ICD-10-CM | POA: Diagnosis not present

## 2021-01-10 DIAGNOSIS — H25812 Combined forms of age-related cataract, left eye: Secondary | ICD-10-CM | POA: Diagnosis not present

## 2021-02-01 IMAGING — DX DG CHEST 2V
2 series · 2 of 2 positions shown · non-contrast
Comparison: 05/03/2020.  02/22/2013.

CLINICAL DATA: Right lower lobe pneumonia.

EXAM:
CHEST - 2 VIEW

[chest pa]
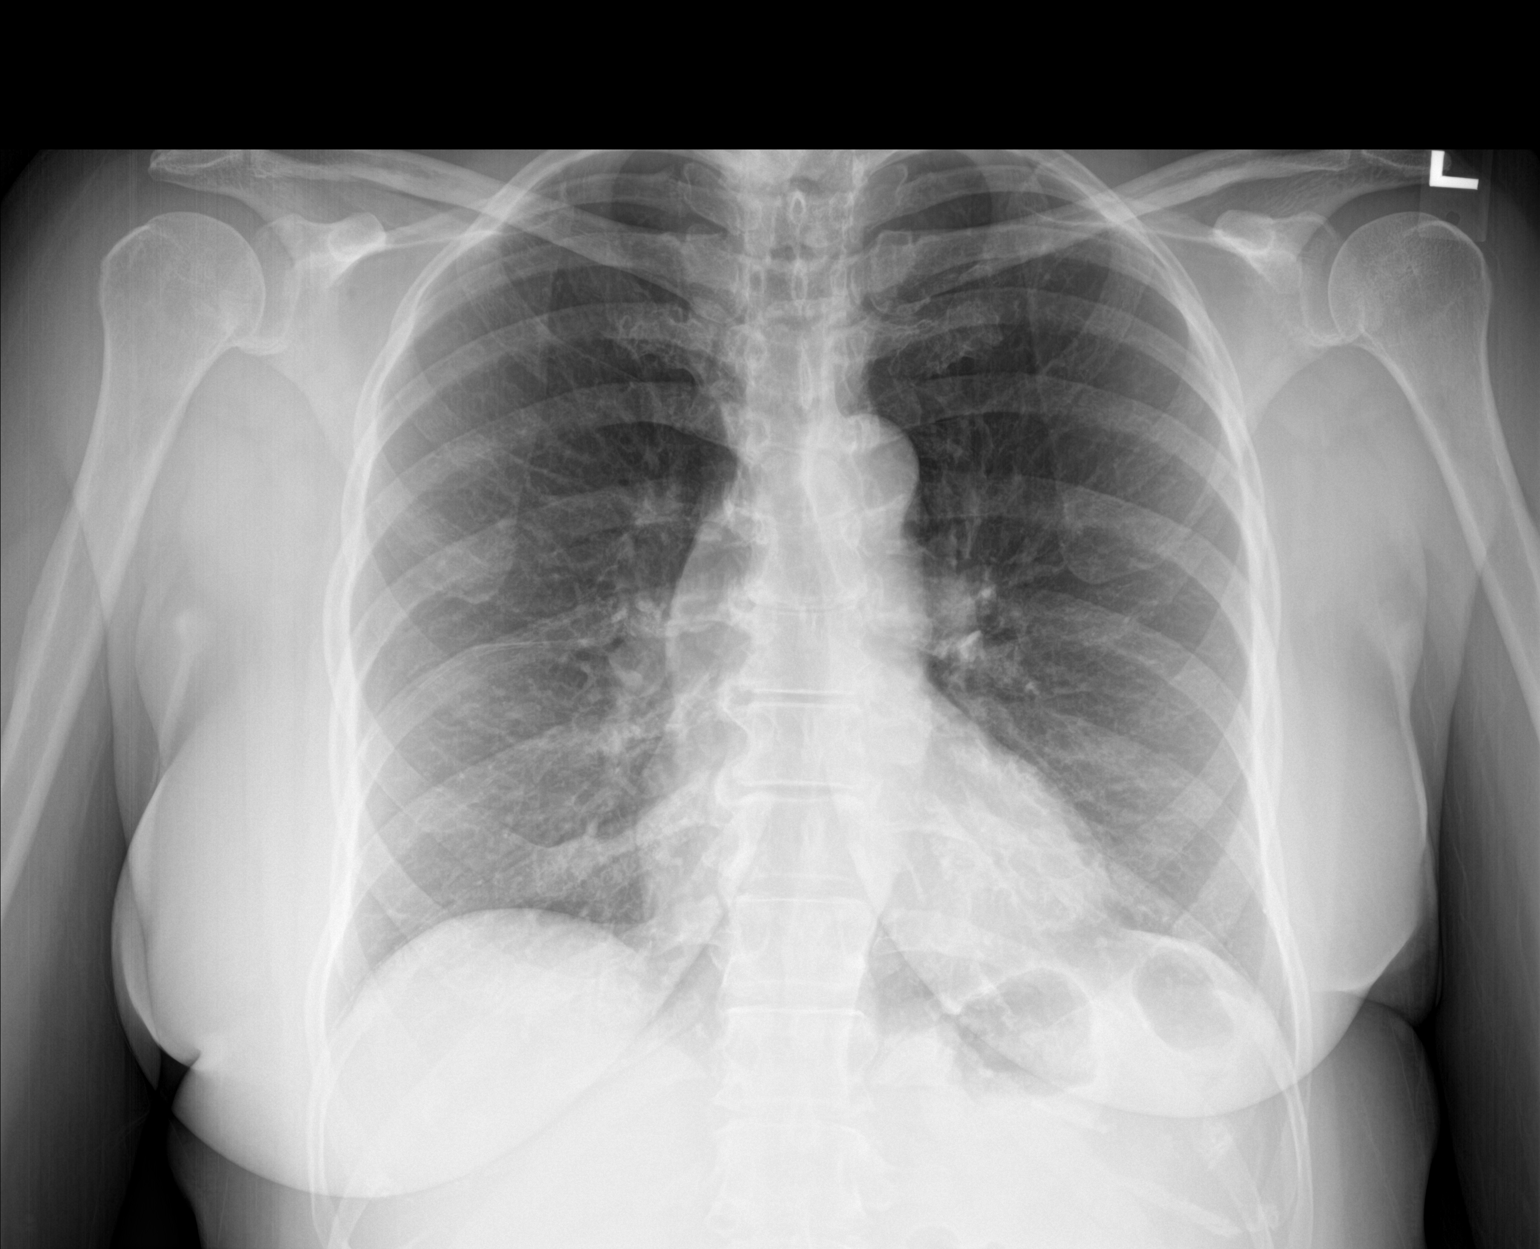

[chest lat]
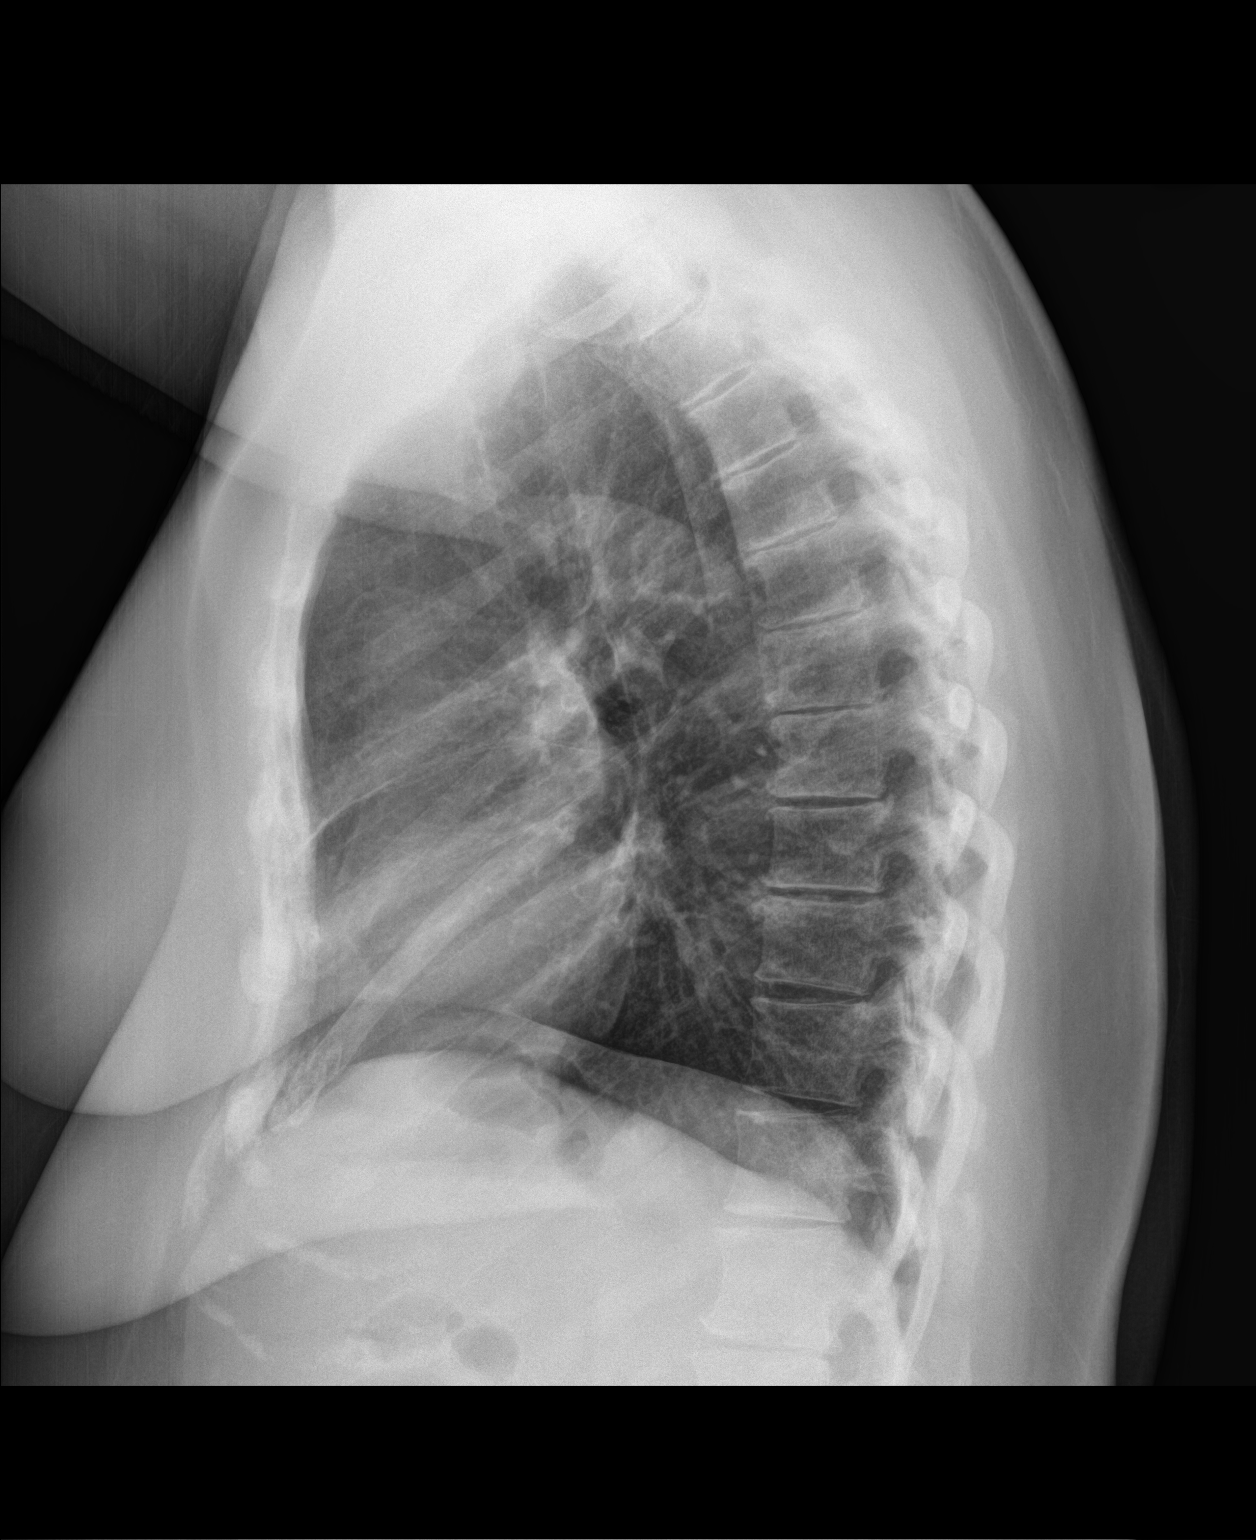

[2 of 2 positions shown; findings below may reference images not displayed]

FINDINGS: Heart size is normal. Mild aortic atherosclerotic calcification. The
lungs are clear. No infiltrate, collapse or effusion. Ordinary mild
degenerative changes affect the spine.
IMPRESSION: No active disease.

## 2021-02-03 ENCOUNTER — Other Ambulatory Visit: Payer: Self-pay | Admitting: Family Medicine

## 2021-02-03 DIAGNOSIS — R0981 Nasal congestion: Secondary | ICD-10-CM

## 2021-02-03 DIAGNOSIS — I10 Essential (primary) hypertension: Secondary | ICD-10-CM

## 2021-02-03 NOTE — Telephone Encounter (Signed)
   Notes to clinic not a provider we approve rx for.

## 2021-02-03 NOTE — Telephone Encounter (Signed)
   Notes to clinic Not a provider we approve rx for.  

## 2021-02-07 DIAGNOSIS — H25011 Cortical age-related cataract, right eye: Secondary | ICD-10-CM | POA: Diagnosis not present

## 2021-02-07 DIAGNOSIS — H40051 Ocular hypertension, right eye: Secondary | ICD-10-CM | POA: Diagnosis not present

## 2021-02-07 DIAGNOSIS — H40053 Ocular hypertension, bilateral: Secondary | ICD-10-CM | POA: Diagnosis not present

## 2021-02-07 DIAGNOSIS — H2511 Age-related nuclear cataract, right eye: Secondary | ICD-10-CM | POA: Diagnosis not present

## 2021-02-07 DIAGNOSIS — H25811 Combined forms of age-related cataract, right eye: Secondary | ICD-10-CM | POA: Diagnosis not present

## 2021-02-22 ENCOUNTER — Other Ambulatory Visit: Payer: Self-pay

## 2021-02-22 ENCOUNTER — Telehealth: Payer: Self-pay | Admitting: Family Medicine

## 2021-02-22 NOTE — Telephone Encounter (Signed)
Patient states Dr. Lorelei Pont told her husband Dannya Pitkin she would taker her as a new pt

## 2021-02-22 NOTE — Telephone Encounter (Signed)
Ok to schedule as new patient   

## 2021-02-24 MED ORDER — HYDROCHLOROTHIAZIDE 25 MG PO TABS: 1.0000 | ORAL_TABLET | Freq: Every day | ORAL | 0 refills | Status: DC

## 2021-02-24 MED ORDER — FLUTICASONE PROPIONATE 50 MCG/ACT NA SUSP: 1.0000 | Freq: Every day | NASAL | 0 refills | Status: DC

## 2021-02-24 NOTE — Addendum Note (Signed)
Addended by: Merri Ray R on: 02/24/2021 07:14 PM   Modules accepted: Orders

## 2021-02-24 NOTE — Telephone Encounter (Signed)
Ppt schedule with pt

## 2021-03-08 NOTE — Progress Notes (Addendum)
[place] at [facility] [address], [place], [place] [address] [phone]  Date:  03/14/2021   Name:  Toni Williams   DOB:  06-01-1951   MRN:  [id number]  PCP:  Darreld Mclean, MD    Chief Complaint: New Patient (Initial Visit) (No concerns-new pcp)   History of Present Illness:  Toni Williams is a 70 y.o. very pleasant female patient who presents with the following:  New patient her today to establish care I have seen her previously in 2016.  History of HTN and breast cancer, eczema. 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  retired in December - so far she is having a good time and looking forward to visiting the beach this summer She is married to Toni Williams- also my patient Her mammo is UTD Needs bone density BP under good control   BP Readings from Last 3 Encounters:  03/14/21 122/82  06/10/20 111/73  06/07/20 120/76   Discussed shingrix-  Encouraged  She did a covid booster- needs 4th dose - encouraged  Patient Active Problem List   Diagnosis Date Noted  . Chest pain 05/17/2012  . Abnormal resting ECG findings 04/24/2012  . Vitamin D deficiency 04/24/2012  . Hypertension 02/11/2012  . Breast cancer ([place]) 02/11/2012  . Eczema 02/11/2012  . Allergic rhinitis due to allergen 02/11/2012    Past Medical History:  Diagnosis Date  . Anxiety    no meds  . Blood transfusion 1979   with missed abortion in [facility] [place]  . Cancer ([place])   . Hypertension   . Missed abortion    x 1  . Seasonal allergies   . Termination of pregnancy    x 1    Past Surgical History:  Procedure Laterality Date  . BREAST SURGERY  10/03/2007, 11/06/2007   Left breast excision, re-excision Left breast  . BREAST SURGERY     No IV sticks/Blood Draws/Blood Pressure Left Arm,  . CESAREAN SECTION    . CESAREAN SECTION  N/A    Phreesia 04/30/2020  . CHOLECYSTECTOMY    . colonscopy    . DILATION AND CURETTAGE OF UTERUS    . HYSTEROSCOPY WITH D & C  10/27/2011   Procedure: DILATATION AND CURETTAGE /HYSTEROSCOPY;  Surgeon: Delice Lesch, MD;  Location: [facility];  Service: Gynecology;  Laterality: N/A;  . WISDOM TOOTH EXTRACTION      Social History   Tobacco Use  . Smoking status: Former Smoker    Packs/day: 0.25    Years: 2.00    Pack years: 0.50    Types: Cigarettes    Quit date: 10/30/1976    Years since quitting: 44.4  . Smokeless tobacco: Never Used  Substance Use Topics  . Alcohol use: Yes    Comment: occasionally  . Drug use: No    Family History  Problem Relation Age of Onset  . Cancer Mother 39       breast (R),stomach, and uterine  . Cancer Father 50       lung from smoking  3 pks/day  . Diabetes Maternal Grandmother        amputation of (L) leg  . Stroke Maternal Grandfather 60  . Diabetes Brother     Allergies  Allergen Reactions  . Morphine And Related Other (See Comments)    "  psychotic" reaction per pt report  . Shellfish Allergy Swelling  . Latex Rash    itching    Medication list has been reviewed and updated.  No current outpatient medications on file prior to visit.   No current facility-administered medications on file prior to visit.     Review of Systems:  As per HPI- otherwise negative.   Physical Examination: Vitals:   03/14/21 1015  BP: 122/82  Pulse: 95  Resp: 17  SpO2: 99%   Vitals:   03/14/21 1015  Weight: 193 lb (87.5 kg)  Height: 5' 3.5" (1.613 m)   Body mass index is 33.65 kg/m. Ideal Body Weight: Weight in (lb) to have BMI = 25: 143.1  GEN: no acute distress.  Obese, looks well  HEENT: Atraumatic, Normocephalic.  Ears and Nose: No external deformity. CV: RRR, No M/G/R. No JVD. No thrill. No extra heart sounds. PULM: CTA B, no wheezes, crackles, rhonchi. No retractions. No resp. distress. No accessory muscle use. ABD: S, NT,  ND. No rebound. No HSM. EXTR: No c/c/e PSYCH: Normally interactive. Conversant.    Assessment and Plan: Estrogen deficiency - Plan: DG Bone Density  Essential hypertension - Plan: CBC, Comprehensive metabolic panel, hydrochlorothiazide (HYDRODIURIL) 25 MG tablet  Nasal congestion - Plan: fluticasone (FLONASE) 50 MCG/ACT nasal spray  Screening for diabetes mellitus - Plan: Comprehensive metabolic panel, Hemoglobin A1c  Screening for hyperlipidemia - Plan: Lipid panel  Fatigue, unspecified type - Plan: TSH, VITAMIN D 25 Hydroxy (Vit-D Deficiency, Fractures)  Eczema, unspecified type - Plan: betamethasone dipropionate (DIPROLENE) 0.05 % ointment  Establishing care today BP under good control Labs pending Encouraged needed immun Ordered bone density Will plan further follow- up pending labs. Pt has generally followed up annually   This visit occurred during the SARS-CoV-2 public health emergency.  Safety protocols were in place, including screening questions prior to the visit, additional usage of staff PPE, and extensive cleaning of exam room while observing appropriate contact time as indicated for disinfecting solutions.    Signed Lamar Blinks, MD  Addendum 5/17, received labs as below Message to patient This is her second A1c greater than 6.5%, diagnosis of diabetes Results for orders placed or performed in visit on 03/14/21  CBC  Result Value Ref Range   WBC 7.2 4.0 - 10.5 K/uL   RBC 5.17 (H) 3.87 - 5.11 Mil/uL   Platelets 312.0 150.0 - 400.0 K/uL   Hemoglobin 13.5 12.0 - 15.0 g/dL   HCT 41.3 36.0 - 46.0 %   MCV 79.9 78.0 - 100.0 fl   MCHC 32.8 30.0 - 36.0 g/dL   RDW 14.6 11.5 - 15.5 %  Comprehensive metabolic panel  Result Value Ref Range   Sodium 139 135 - 145 mEq/L   Potassium 3.7 3.5 - 5.1 mEq/L   Chloride 101 96 - 112 mEq/L   CO2 29 19 - 32 mEq/L   Glucose, Bld 125 (H) 70 - 99 mg/dL   BUN 17 6 - 23 mg/dL   Creatinine, Ser 0.71 0.40 - 1.20 mg/dL    Total Bilirubin 0.6 0.2 - 1.2 mg/dL   Alkaline Phosphatase 71 39 - 117 U/L   AST 19 0 - 37 U/L   ALT 22 0 - 35 U/L   Total Protein 7.6 6.0 - 8.3 g/dL   Albumin 4.2 3.5 - 5.2 g/dL   GFR 86.01 >60.00 mL/min   Calcium 9.8 8.4 - 10.5 mg/dL  Hemoglobin A1c  Result Value Ref Range   Hgb A1c  MFr Bld 6.8 (H) 4.6 - 6.5 %  Lipid panel  Result Value Ref Range   Cholesterol 207 (H) 0 - 200 mg/dL   Triglycerides 128.0 0.0 - 149.0 mg/dL   HDL 56.50 >39.00 mg/dL   VLDL 25.6 0.0 - 40.0 mg/dL   LDL Cholesterol 125 (H) 0 - 99 mg/dL   Total CHOL/HDL Ratio 4    NonHDL 150.72   TSH  Result Value Ref Range   TSH 2.13 0.35 - 4.50 uIU/mL  VITAMIN D 25 Hydroxy (Vit-D Deficiency, Fractures)  Result Value Ref Range   VITD 35.37 30.00 - 100.00 ng/mL

## 2021-03-14 ENCOUNTER — Ambulatory Visit (INDEPENDENT_AMBULATORY_CARE_PROVIDER_SITE_OTHER): Payer: Medicare Other | Admitting: Family Medicine

## 2021-03-14 ENCOUNTER — Encounter: Payer: Self-pay | Admitting: Family Medicine

## 2021-03-14 ENCOUNTER — Other Ambulatory Visit: Payer: Self-pay

## 2021-03-14 VITALS — BP 122/82 | HR 95 | Resp 17 | Ht 63.5 in | Wt 193.0 lb

## 2021-03-14 DIAGNOSIS — R5383 Other fatigue: Secondary | ICD-10-CM | POA: Diagnosis not present

## 2021-03-14 DIAGNOSIS — L309 Dermatitis, unspecified: Secondary | ICD-10-CM

## 2021-03-14 DIAGNOSIS — E2839 Other primary ovarian failure: Secondary | ICD-10-CM | POA: Diagnosis not present

## 2021-03-14 DIAGNOSIS — I10 Essential (primary) hypertension: Secondary | ICD-10-CM

## 2021-03-14 DIAGNOSIS — Z131 Encounter for screening for diabetes mellitus: Secondary | ICD-10-CM | POA: Diagnosis not present

## 2021-03-14 DIAGNOSIS — Z23 Encounter for immunization: Secondary | ICD-10-CM

## 2021-03-14 DIAGNOSIS — Z13220 Encounter for screening for lipoid disorders: Secondary | ICD-10-CM | POA: Diagnosis not present

## 2021-03-14 DIAGNOSIS — E119 Type 2 diabetes mellitus without complications: Secondary | ICD-10-CM

## 2021-03-14 DIAGNOSIS — R0981 Nasal congestion: Secondary | ICD-10-CM

## 2021-03-14 LAB — CBC
HCT: 41.3 % (ref 36.0–46.0)
Hemoglobin: 13.5 g/dL (ref 12.0–15.0)
MCHC: 32.8 g/dL (ref 30.0–36.0)
MCV: 79.9 fl (ref 78.0–100.0)
Platelets: 312 10*3/uL (ref 150.0–400.0)
RBC: 5.17 Mil/uL — ABNORMAL HIGH (ref 3.87–5.11)
RDW: 14.6 % (ref 11.5–15.5)
WBC: 7.2 10*3/uL (ref 4.0–10.5)

## 2021-03-14 LAB — COMPREHENSIVE METABOLIC PANEL
ALT: 22 U/L (ref 0–35)
AST: 19 U/L (ref 0–37)
Albumin: 4.2 g/dL (ref 3.5–5.2)
Alkaline Phosphatase: 71 U/L (ref 39–117)
BUN: 17 mg/dL (ref 6–23)
CO2: 29 mEq/L (ref 19–32)
Calcium: 9.8 mg/dL (ref 8.4–10.5)
Chloride: 101 mEq/L (ref 96–112)
Creatinine, Ser: 0.71 mg/dL (ref 0.40–1.20)
GFR: 86.01 mL/min (ref >60.00)
Glucose, Bld: 125 mg/dL — ABNORMAL HIGH (ref 70–99)
Potassium: 3.7 mEq/L (ref 3.5–5.1)
Sodium: 139 mEq/L (ref 135–145)
Total Bilirubin: 0.6 mg/dL (ref 0.2–1.2)
Total Protein: 7.6 g/dL (ref 6.0–8.3)

## 2021-03-14 LAB — VITAMIN D 25 HYDROXY (VIT D DEFICIENCY, FRACTURES): VITD: 35.37 ng/mL (ref 30.00–100.00)

## 2021-03-14 LAB — HEMOGLOBIN A1C: Hgb A1c MFr Bld: 6.8 % — ABNORMAL HIGH (ref 4.6–6.5)

## 2021-03-14 LAB — LIPID PANEL
Cholesterol: 207 mg/dL — ABNORMAL HIGH (ref 0–200)
HDL: 56.5 mg/dL (ref >39.00)
LDL Cholesterol: 125 mg/dL — ABNORMAL HIGH (ref 0–99)
NonHDL: 150.72
Total CHOL/HDL Ratio: 4
Triglycerides: 128 mg/dL (ref 0.0–149.0)
VLDL: 25.6 mg/dL (ref 0.0–40.0)

## 2021-03-14 LAB — TSH: TSH: 2.13 u[IU]/mL (ref 0.35–4.50)

## 2021-03-14 MED ORDER — BETAMETHASONE DIPROPIONATE 0.05 % EX OINT: TOPICAL_OINTMENT | Freq: Two times a day (BID) | CUTANEOUS | 3 refills | Status: DC

## 2021-03-14 MED ORDER — HYDROCHLOROTHIAZIDE 25 MG PO TABS: 1.0000 | ORAL_TABLET | Freq: Every day | ORAL | 3 refills | Status: DC

## 2021-03-14 MED ORDER — FLUTICASONE PROPIONATE 50 MCG/ACT NA SUSP: 1.0000 | Freq: Every day | NASAL | 3 refills | Status: DC

## 2021-03-14 NOTE — Patient Instructions (Signed)
It was great to see you again today- please give my best to Toni Williams I will be in touch with your labs asap  Please consider getting a 4th dose of covid and your shingles vaccine series at your pharmacy I ordered a bone density scan for you- this can be done with your next mammogram

## 2021-03-15 ENCOUNTER — Encounter: Payer: Self-pay | Admitting: Family Medicine

## 2021-03-15 DIAGNOSIS — E119 Type 2 diabetes mellitus without complications: Secondary | ICD-10-CM | POA: Insufficient documentation

## 2021-04-06 ENCOUNTER — Encounter: Payer: Self-pay | Admitting: Family Medicine

## 2021-04-06 NOTE — Addendum Note (Signed)
Addended by: Kem Boroughs D on: 04/06/2021 01:42 PM   Modules accepted: Orders

## 2021-05-31 ENCOUNTER — Telehealth: Payer: Self-pay | Admitting: Family Medicine

## 2021-05-31 NOTE — Telephone Encounter (Signed)
Copied from [place] [phone]. Topic: Medicare AWV >> May 31, 2021 10:50 AM Harris-Coley, Hannah Beat wrote: Reason for CRM: Left message for patient to schedule Annual Wellness Visit.  Please schedule with Health Nurse Advisor Augustine Radar. at [facility].

## 2021-07-09 NOTE — Patient Instructions (Addendum)
Great to see you again today- please see me in about 6 months assuming all is well If not done already please consider getting the shingles vaccine at your drugstore   You can also get the new covid booster at your convenience  Tetanus vaccine today  Take care!

## 2021-07-09 NOTE — Progress Notes (Signed)
[place] at [facility] [address] [place], [place] [address] [phone]  Date:  07/18/2021   Name:  Toni Williams   DOB:  1951/09/25   MRN:  [id number]  PCP:  Darreld Mclean, MD    Chief Complaint: folow up (Concerns/ questions: none/Flu shot: will wait until october/)   History of Present Illness:  Toni Williams is a 70 y.o. very pleasant female patient who presents with the following:  Pt seen today for a follow-up visit and labs  History of DM, HTN and breast cancer, eczema. She is no longer followed by oncology - just does annual mammo Last seen by myself in May.  At that time she had recently retired and was enjoying her free time; she is still enjoying retirement  Married to [employer] exam-we will do today Eye exam- seen by Dr Nicki Reaper every 6 months  Urine micro due - will do today  Shingrix-encouraged to have done at pharmacy Tetanus- give today  Covid booster- she had covid in August, she does plan to do this fall  Flu - later in the fall  Mammo one year ago; she had to reschedule due to covid   Wt Readings from Last 3 Encounters:  07/18/21 183 lb 6.4 oz (83.2 kg)  03/14/21 193 lb (87.5 kg)  06/10/20 192 lb 6.4 oz (87.3 kg)   She has lost about 10 lbs- working on her diet and trying to lose   Lab Results  Component Value Date   HGBA1C 6.8 (H) 03/14/2021    Patient Active Problem List   Diagnosis Date Noted   Controlled type 2 diabetes mellitus without complication, without long-term current use of insulin ([place]) 03/15/2021   Chest pain 05/17/2012   Abnormal resting ECG findings 04/24/2012   Vitamin D deficiency 04/24/2012   Hypertension 02/11/2012   Breast cancer ([place]) 02/11/2012   Eczema 02/11/2012   Allergic rhinitis due to allergen 02/11/2012    Past Medical History:  Diagnosis Date   Anxiety    no meds   Blood transfusion 1979   with missed abortion in [place] -Dr  He[place]   Cancer [facility])    Hypertension    Missed abortion    x 1   Seasonal allergies    Termination of pregnancy    x 1    Past Surgical History:  Procedure Laterality Date   BREAST SURGERY  10/03/2007, 11/06/2007   Left breast excision, re-excision Left breast   BREAST SURGERY     No IV sticks/Blood Draws/Blood Pressure Left Arm,   CESAREAN SECTION     CESAREAN SECTION N/A    Phreesia 04/30/2020   CHOLECYSTECTOMY     colonscopy     DILATION AND CURETTAGE OF UTERUS     HYSTEROSCOPY WITH D & C  10/27/2011   Procedure: DILATATION AND CURETTAGE /HYSTEROSCOPY;  Surgeon: Delice Lesch, MD;  Location: [facility];  Service: Gynecology;  Laterality: N/A;   WISDOM TOOTH EXTRACTION      Social History   Tobacco Use   Smoking status: Former    Packs/day: 0.25    Years: 2.00    Pack years: 0.50    Types: Cigarettes    Quit date: 10/30/1976    Years since quitting: 44.7   Smokeless tobacco: Never  Substance Use Topics   Alcohol use: Yes    Comment: occasionally   Drug use: No  Family History  Problem Relation Age of Onset   Cancer Mother 85       breast (R),stomach, and uterine   Cancer Father 20       lung from smoking  3 pks/day   Diabetes Maternal Grandmother        amputation of (L) leg   Stroke Maternal Grandfather 60   Diabetes Brother     Allergies  Allergen Reactions   Morphine And Related Other (See Comments)    "psychotic" reaction per pt report   Shellfish Allergy Swelling   Latex Rash    itching    Medication list has been reviewed and updated.  Current Outpatient Medications on File Prior to Visit  Medication Sig Dispense Refill   hydrochlorothiazide (HYDRODIURIL) 25 MG tablet Take 1 tablet (25 mg total) by mouth daily. 90 tablet 3   No current facility-administered medications on file prior to visit.    Review of Systems:  As per HPI- otherwise negative.   Physical Examination: Vitals:   07/18/21 1040  BP: 131/80  Pulse: 74  Resp: 18   Temp: 97.9 F (36.6 C)  SpO2: 99%   Vitals:   07/18/21 1040  Weight: 183 lb 6.4 oz (83.2 kg)  Height: '5\' 4"'$  (1.626 m)   Body mass index is 31.48 kg/m. Ideal Body Weight: Weight in (lb) to have BMI = 25: 145.3  GEN: no acute distress.  Overweight, looks well HEENT: Atraumatic, Normocephalic.  Ears and Nose: No external deformity. CV: RRR, No M/G/R. No JVD. No thrill. No extra heart sounds. PULM: CTA B, no wheezes, crackles, rhonchi. No retractions. No resp. distress. No accessory muscle use. ABD: S, NT, ND, +BS. No rebound. No HSM. EXTR: No c/c/e PSYCH: Normally interactive. Conversant.  Foot exam: - normal  Eczema on her arms, eczema related skin crack right index finger.  No further wound care is necessary, but will update tetanus shot today Assessment and Plan: Essential hypertension  Controlled type 2 diabetes mellitus without complication, without long-term current use of insulin (HCC) - Plan: Hemoglobin A1c, Microalbumin / creatinine urine ratio  Malignant neoplasm of female breast, unspecified estrogen receptor status, unspecified laterality, unspecified site of breast (HCC)  Eczema, unspecified type - Plan: betamethasone dipropionate (DIPROLENE) 0.05 % ointment, hydrocortisone 2.5 % cream  Immunization due - Plan: Tdap vaccine greater than or equal to 7yo IM  Estrogen deficiency - Plan: DG Bone Density  Nasal congestion - Plan: fluticasone (FLONASE) 50 MCG/ACT nasal spray  Open wound of finger of right hand, initial encounter - Plan: Tdap vaccine greater than or equal to 7yo IM  Patient seen today for follow-up.  Blood pressure controlled on current regimen She is working on weight loss, congratulated her on her progress Refill medications for eczema Update tetanus Will plan further follow- up pending labs.   This visit occurred during the SARS-CoV-2 public health emergency.  Safety protocols were in place, including screening questions prior to the visit,  additional usage of staff PPE, and extensive cleaning of exam room while observing appropriate contact time as indicated for disinfecting solutions.   Signed Lamar Blinks, MD  Received her labs as below, message to patient  Results for orders placed or performed in visit on 07/18/21  Hemoglobin A1c  Result Value Ref Range   Hgb A1c MFr Bld 6.5 4.6 - 6.5 %  Microalbumin / creatinine urine ratio  Result Value Ref Range   Microalb, Ur <0.7 0.0 - 1.9 mg/dL   Creatinine,U 135.8 mg/dL  Microalb Creat Ratio 0.5 0.0 - 30.0 mg/g

## 2021-07-18 ENCOUNTER — Other Ambulatory Visit: Payer: Self-pay

## 2021-07-18 ENCOUNTER — Encounter: Payer: Self-pay | Admitting: Family Medicine

## 2021-07-18 ENCOUNTER — Ambulatory Visit (INDEPENDENT_AMBULATORY_CARE_PROVIDER_SITE_OTHER): Payer: Medicare Other | Admitting: Family Medicine

## 2021-07-18 VITALS — BP 131/80 | HR 74 | Temp 97.9°F | Resp 18 | Ht 64.0 in | Wt 183.4 lb

## 2021-07-18 DIAGNOSIS — S61209A Unspecified open wound of unspecified finger without damage to nail, initial encounter: Secondary | ICD-10-CM

## 2021-07-18 DIAGNOSIS — E2839 Other primary ovarian failure: Secondary | ICD-10-CM

## 2021-07-18 DIAGNOSIS — E119 Type 2 diabetes mellitus without complications: Secondary | ICD-10-CM | POA: Diagnosis not present

## 2021-07-18 DIAGNOSIS — L309 Dermatitis, unspecified: Secondary | ICD-10-CM | POA: Diagnosis not present

## 2021-07-18 DIAGNOSIS — Z23 Encounter for immunization: Secondary | ICD-10-CM | POA: Diagnosis not present

## 2021-07-18 DIAGNOSIS — C50919 Malignant neoplasm of unspecified site of unspecified female breast: Secondary | ICD-10-CM

## 2021-07-18 DIAGNOSIS — R0981 Nasal congestion: Secondary | ICD-10-CM

## 2021-07-18 DIAGNOSIS — I10 Essential (primary) hypertension: Secondary | ICD-10-CM

## 2021-07-18 LAB — HEMOGLOBIN A1C: Hgb A1c MFr Bld: 6.5 % (ref 4.6–6.5)

## 2021-07-18 LAB — MICROALBUMIN / CREATININE URINE RATIO
Creatinine,U: 135.8 mg/dL
Microalb Creat Ratio: 0.5 mg/g (ref 0.0–30.0)
Microalb, Ur: <0.7 mg/dL (ref 0.0–1.9)

## 2021-07-18 MED ORDER — HYDROCORTISONE 2.5 % EX CREA: TOPICAL_CREAM | Freq: Two times a day (BID) | CUTANEOUS | 3 refills | Status: AC

## 2021-07-18 MED ORDER — FLUTICASONE PROPIONATE 50 MCG/ACT NA SUSP: 1.0000 | Freq: Every day | NASAL | 3 refills | Status: DC

## 2021-07-18 MED ORDER — BETAMETHASONE DIPROPIONATE 0.05 % EX OINT: TOPICAL_OINTMENT | Freq: Two times a day (BID) | CUTANEOUS | 3 refills | Status: DC

## 2021-07-22 LAB — HM MAMMOGRAPHY

## 2021-08-04 ENCOUNTER — Encounter: Payer: Self-pay | Admitting: Family Medicine

## 2021-08-04 LAB — HM DEXA SCAN: HM Dexa Scan: NORMAL

## 2021-08-08 ENCOUNTER — Encounter: Payer: Self-pay | Admitting: Family Medicine

## 2021-08-23 DIAGNOSIS — H40053 Ocular hypertension, bilateral: Secondary | ICD-10-CM | POA: Diagnosis not present

## 2021-09-29 ENCOUNTER — Ambulatory Visit (INDEPENDENT_AMBULATORY_CARE_PROVIDER_SITE_OTHER): Payer: Medicare Other

## 2021-09-29 VITALS — Ht 64.0 in | Wt 183.0 lb

## 2021-09-29 DIAGNOSIS — Z0000 Encounter for general adult medical examination without abnormal findings: Secondary | ICD-10-CM

## 2021-09-29 NOTE — Patient Instructions (Signed)
Toni Williams , Thank you for taking time to complete your Medicare Wellness Visit. I appreciate your ongoing commitment to your health goals. Please review the following plan we discussed and let me know if I can assist you in the future.   Screening recommendations/referrals: Colonoscopy: Cologuard completed 06/24/2020-Due 06/25/2023 Mammogram: Completed 08/04/2021-Due 08/04/2022 Bone Density: Completed 08/04/2021-Due 08/05/2023 Recommended yearly ophthalmology/optometry visit for glaucoma screening and checkup Recommended yearly dental visit for hygiene and checkup  Vaccinations: Influenza vaccine: Declined Pneumococcal vaccine: Due-May obtain vaccine at our office or your local pharmacy. Tdap vaccine: Up to date Shingles vaccine: Discuss with pharmacy   Covid-19:Booster available at the pharmacy  Advanced directives: Declined information today  Conditions/risks identified: See problem list  Next appointment: Follow up in one year for your annual wellness visit    Preventive Care 71 Years and Older, Female Preventive care refers to lifestyle choices and visits with your health care provider that can promote health and wellness. What does preventive care include? A yearly physical exam. This is also called an annual well check. Dental exams once or twice a year. Routine eye exams. Ask your health care provider how often you should have your eyes checked. Personal lifestyle choices, including: Daily care of your teeth and gums. Regular physical activity. Eating a healthy diet. Avoiding tobacco and drug use. Limiting alcohol use. Practicing safe sex. Taking low-dose aspirin every day. Taking vitamin and mineral supplements as recommended by your health care provider. What happens during an annual well check? The services and screenings done by your health care provider during your annual well check will depend on your age, overall health, lifestyle risk factors, and family history of  disease. Counseling  Your health care provider may ask you questions about your: Alcohol use. Tobacco use. Drug use. Emotional well-being. Home and relationship well-being. Sexual activity. Eating habits. History of falls. Memory and ability to understand (cognition). Work and work Statistician. Reproductive health. Screening  You may have the following tests or measurements: Height, weight, and BMI. Blood pressure. Lipid and cholesterol levels. These may be checked every 5 years, or more frequently if you are over 1 years old. Skin check. Lung cancer screening. You may have this screening every year starting at age 19 if you have a 30-pack-year history of smoking and currently smoke or have quit within the past 15 years. Fecal occult blood test (FOBT) of the stool. You may have this test every year starting at age 76. Flexible sigmoidoscopy or colonoscopy. You may have a sigmoidoscopy every 5 years or a colonoscopy every 10 years starting at age 84. Hepatitis C blood test. Hepatitis B blood test. Sexually transmitted disease (STD) testing. Diabetes screening. This is done by checking your blood sugar (glucose) after you have not eaten for a while (fasting). You may have this done every 1-3 years. Bone density scan. This is done to screen for osteoporosis. You may have this done starting at age 78. Mammogram. This may be done every 1-2 years. Talk to your health care provider about how often you should have regular mammograms. Talk with your health care provider about your test results, treatment options, and if necessary, the need for more tests. Vaccines  Your health care provider may recommend certain vaccines, such as: Influenza vaccine. This is recommended every year. Tetanus, diphtheria, and acellular pertussis (Tdap, Td) vaccine. You may need a Td booster every 10 years. Zoster vaccine. You may need this after age 24. Pneumococcal 13-valent conjugate (PCV13) vaccine. One  dose  is recommended after age 96. Pneumococcal polysaccharide (PPSV23) vaccine. One dose is recommended after age 12. Talk to your health care provider about which screenings and vaccines you need and how often you need them. This information is not intended to replace advice given to you by your health care provider. Make sure you discuss any questions you have with your health care provider. Document Released: 11/12/2015 Document Revised: 07/05/2016 Document Reviewed: 08/17/2015 Elsevier Interactive Patient Education  2017 [employer] Prevention in the Home Falls can cause injuries. They can happen to people of all ages. There are many things you can do to make your home safe and to help prevent falls. What can I do on the outside of my home? Regularly fix the edges of walkways and driveways and fix any cracks. Remove anything that might make you trip as you walk through a door, such as a raised step or threshold. Trim any bushes or trees on the path to your home. Use bright outdoor lighting. Clear any walking paths of anything that might make someone trip, such as rocks or tools. Regularly check to see if handrails are loose or broken. Make sure that both sides of any steps have handrails. Any raised decks and porches should have guardrails on the edges. Have any leaves, snow, or ice cleared regularly. Use sand or salt on walking paths during winter. Clean up any spills in your garage right away. This includes oil or grease spills. What can I do in the bathroom? Use night lights. Install grab bars by the toilet and in the tub and shower. Do not use towel bars as grab bars. Use non-skid mats or decals in the tub or shower. If you need to sit down in the shower, use a plastic, non-slip stool. Keep the floor dry. Clean up any water that spills on the floor as soon as it happens. Remove soap buildup in the tub or shower regularly. Attach bath mats securely with double-sided  non-slip rug tape. Do not have throw rugs and other things on the floor that can make you trip. What can I do in the bedroom? Use night lights. Make sure that you have a light by your bed that is easy to reach. Do not use any sheets or blankets that are too big for your bed. They should not hang down onto the floor. Have a firm chair that has side arms. You can use this for support while you get dressed. Do not have throw rugs and other things on the floor that can make you trip. What can I do in the kitchen? Clean up any spills right away. Avoid walking on wet floors. Keep items that you use a lot in easy-to-reach places. If you need to reach something above you, use a strong step stool that has a grab bar. Keep electrical cords out of the way. Do not use floor polish or wax that makes floors slippery. If you must use wax, use non-skid floor wax. Do not have throw rugs and other things on the floor that can make you trip. What can I do with my stairs? Do not leave any items on the stairs. Make sure that there are handrails on both sides of the stairs and use them. Fix handrails that are broken or loose. Make sure that handrails are as long as the stairways. Check any carpeting to make sure that it is firmly attached to the stairs. Fix any carpet that is loose or worn. Avoid having  throw rugs at the top or bottom of the stairs. If you do have throw rugs, attach them to the floor with carpet tape. Make sure that you have a light switch at the top of the stairs and the bottom of the stairs. If you do not have them, ask someone to add them for you. What else can I do to help prevent falls? Wear shoes that: Do not have high heels. Have rubber bottoms. Are comfortable and fit you well. Are closed at the toe. Do not wear sandals. If you use a stepladder: Make sure that it is fully opened. Do not climb a closed stepladder. Make sure that both sides of the stepladder are locked into place. Ask  someone to hold it for you, if possible. Clearly mark and make sure that you can see: Any grab bars or handrails. First and last steps. Where the edge of each step is. Use tools that help you move around (mobility aids) if they are needed. These include: Canes. Walkers. Scooters. Crutches. Turn on the lights when you go into a dark area. Replace any light bulbs as soon as they burn out. Set up your furniture so you have a clear path. Avoid moving your furniture around. If any of your floors are uneven, fix them. If there are any pets around you, be aware of where they are. Review your medicines with your doctor. Some medicines can make you feel dizzy. This can increase your chance of falling. Ask your doctor what other things that you can do to help prevent falls. This information is not intended to replace advice given to you by your health care provider. Make sure you discuss any questions you have with your health care provider. Document Released: 08/12/2009 Document Revised: 03/23/2016 Document Reviewed: 11/20/2014 Elsevier Interactive Patient Education  2017 [employer].

## 2021-09-29 NOTE — Progress Notes (Signed)
Subjective:   Toni Williams is a 70 y.o. female who presents for Medicare Annual (Subsequent) preventive examination.  I connected with Jeri today by telephone and verified that I am speaking with the correct person using two identifiers. Location patient: home Location provider: work Persons participating in the virtual visit: patient, Marine scientist.    I discussed the limitations, risks, security and privacy concerns of performing an evaluation and management service by telephone and the availability of in person appointments. I also discussed with the patient that there may be a patient responsible charge related to this service. The patient expressed understanding and verbally consented to this telephonic visit.    Interactive audio and video telecommunications were attempted between this provider and patient, however failed, due to patient having technical difficulties OR patient did not have access to video capability.  We continued and completed visit with audio only.  Some vital signs may be absent or patient reported.   Time Spent with patient on telephone encounter: 20 minutes   Review of Systems     Cardiac Risk Factors include: advanced age (>62men, >22 women);diabetes mellitus;hypertension;obesity (BMI >30kg/m2)     Objective:    Today's Vitals   09/29/21 1421  Weight: 183 lb (83 kg)  Height: 5\' 4"  (1.626 m)   Body mass index is 31.41 kg/m.  Advanced Directives 09/29/2021 06/07/2020 05/03/2020 06/05/2019 02/12/2015 10/18/2011  Does Patient Have a Medical Advance Directive? No No No No No Patient does not have advance directive  Would patient like information on creating a medical advance directive? No - Patient declined Yes (ED - Information included in AVS) - Yes (MAU/Ambulatory/Procedural Areas - Information given) No - patient declined information -    Current Medications (verified) Outpatient Encounter Medications as of 09/29/2021  Medication Sig    betamethasone dipropionate (DIPROLENE) 0.05 % ointment Apply topically 2 (two) times daily. Use as needed for eczema   fluticasone (FLONASE) 50 MCG/ACT nasal spray Place 1-2 sprays into both nostrils daily.   hydrochlorothiazide (HYDRODIURIL) 25 MG tablet Take 1 tablet (25 mg total) by mouth daily.   hydrocortisone 2.5 % cream Apply topically 2 (two) times daily. Apply lightly on face if needed   No facility-administered encounter medications on file as of 09/29/2021.    Allergies (verified) Morphine and related, Shellfish allergy, and Latex   History: Past Medical History:  Diagnosis Date   Anxiety    no meds   Blood transfusion 1979   with missed abortion in [place] -Dr Heber [facility]   Cancer [facility])    Hypertension    Missed abortion    x 1   Seasonal allergies    Termination of pregnancy    x 1   Past Surgical History:  Procedure Laterality Date   BREAST SURGERY  10/03/2007, 11/06/2007   Left breast excision, re-excision Left breast   BREAST SURGERY     No IV sticks/Blood Draws/Blood Pressure Left Arm,   CESAREAN SECTION     CESAREAN SECTION N/A    Phreesia 04/30/2020   CHOLECYSTECTOMY     colonscopy     DILATION AND CURETTAGE OF UTERUS     HYSTEROSCOPY WITH D & C  10/27/2011   Procedure: DILATATION AND CURETTAGE /HYSTEROSCOPY;  Surgeon: Delice Lesch, MD;  Location: [facility];  Service: Gynecology;  Laterality: N/A;   WISDOM TOOTH EXTRACTION     Family History  Problem Relation Age of Onset   Cancer Mother 75       breast (R),stomach,  and uterine   Cancer Father 52       lung from smoking  3 pks/day   Diabetes Maternal Grandmother        amputation of (L) leg   Stroke Maternal Grandfather 63   Diabetes Brother    Social History   Socioeconomic History   Marital status: Married    Spouse name: Not on file   Number of children: Not on file   Years of education: Not on file   Highest education level: Not on file  Occupational History   Not on file  Tobacco Use    Smoking status: Former    Packs/day: 0.25    Years: 2.00    Pack years: 0.50    Types: Cigarettes    Quit date: 10/30/1976    Years since quitting: 44.9   Smokeless tobacco: Never  Substance and Sexual Activity   Alcohol use: Yes    Comment: occasionally   Drug use: No   Sexual activity: Not Currently    Birth control/protection: None  Other Topics Concern   Not on file  Social History Narrative   Not on file   Social Determinants of Health   Financial Resource Strain: Low Risk    Difficulty of Paying Living Expenses: Not hard at all  Food Insecurity: No Food Insecurity   Worried About Charity fundraiser in the Last Year: Never true   [place] in the Last Year: Never true  Transportation Needs: No Transportation Needs   Lack of Transportation (Medical): No   Lack of Transportation (Non-Medical): No  Physical Activity: Insufficiently Active   Days of Exercise per Week: 2 days   Minutes of Exercise per Session: 20 min  Stress: No Stress Concern Present   Feeling of Stress : Not at all  Social Connections: Socially Integrated   Frequency of Communication with Friends and Family: More than three times a week   Frequency of Social Gatherings with Friends and Family: More than three times a week   Attends Religious Services: More than 4 times per year   Active Member of [employer] or Organizations: Yes   Attends Music therapist: More than 4 times per year   Marital Status: Married    Tobacco Counseling Counseling given: Not Answered   Clinical Intake:  Pre-visit preparation completed: Yes        BMI - recorded: 31.41 Nutritional Status: BMI > 30  Obese Nutritional Risks: None Diabetes: Yes CBG done?: No Did pt. bring in CBG monitor from home?: No (phone visit)  How often do you need to have someone help you when you read instructions, pamphlets, or other written materials from your doctor or pharmacy?: 1 - Never  Diabetes:  Is the patient  diabetic?  Yes  If diabetic, was a CBG obtained today?  No  Did the patient bring in their glucometer from home?  No phone visit How often do you monitor your CBG's? never.   Financial Strains and Diabetes Management:  Are you having any financial strains with the device, your supplies or your medication? No .  Does the patient want to be seen by Chronic Care Management for management of their diabetes?  No  Would the patient like to be referred to a Nutritionist or for Diabetic Management?  No   Diabetic Exams:  Diabetic Eye Exam: Completed 07/2021 per patient.   Diabetic Foot Exam: Completed 07/18/2021.    Interpreter Needed?: No  Information entered by ::  Caroleen Hamman LPN   Activities of Daily Living In your present state of health, do you have any difficulty performing the following activities: 09/29/2021  Hearing? N  Vision? N  Difficulty concentrating or making decisions? N  Walking or climbing stairs? N  Dressing or bathing? N  Doing errands, shopping? N  Preparing Food and eating ? N  Using the Toilet? N  In the past six months, have you accidently leaked urine? N  Do you have problems with loss of bowel control? N  Managing your Medications? N  Managing your Finances? N  Housekeeping or managing your Housekeeping? N  Some recent data might be hidden    Patient Care Team: Copland, Gay Filler, MD as PCP - General (Family Medicine) Sueanne Margarita, MD as PCP - Cardiology (Cardiology) Kennith Center, RD as Dietitian (Family Medicine) Jari Pigg, MD as Consulting Physician (Dermatology) Everett Graff, MD as Consulting Physician (Obstetrics and Gynecology)  Indicate any recent Medical Services you may have received from other than [facility] providers in the past year (date may be approximate).     Assessment:   This is a routine wellness examination for [place].  Hearing/Vision screen Hearing Screening - Comments:: No issues Vision Screening - Comments::  Last eye exam-07/2021-Dr. Nicki Reaper  Dietary issues and exercise activities discussed: Current Exercise Habits: Home exercise routine, Type of exercise: treadmill, Time (Minutes): 15, Frequency (Times/Week): 2, Weekly Exercise (Minutes/Week): 30, Intensity: Mild, Exercise limited by: None identified   Goals Addressed             This Visit's Progress    Patient Stated       Drink more water, eat healthier & increase exercise       Depression Screen PHQ 2/9 Scores 09/29/2021 07/18/2021 06/10/2020 06/07/2020 05/10/2020 04/30/2020 02/09/2020  PHQ - 2 Score 0 0 0 0 0 0 0  Exception Documentation - Medical reason - - - - -    Fall Risk Fall Risk  09/29/2021 07/18/2021 06/10/2020 06/07/2020 05/10/2020  Falls in the past year? 0 0 0 0 0  Comment - - - - -  Number falls in past yr: 0 0 0 0 -  Injury with Fall? 0 0 0 0 -  Risk for fall due to : - - No Fall Risks - -  Follow up Falls prevention discussed - Falls evaluation completed Falls evaluation completed;Education provided Falls evaluation completed    [place]:  Any stairs in or around the home? Yes  If so, are there any without handrails? No  Home free of loose throw rugs in walkways, pet beds, electrical cords, etc? Yes  Adequate lighting in your home to reduce risk of falls? Yes   ASSISTIVE DEVICES UTILIZED TO PREVENT FALLS:  Life alert? No  Use of a cane, walker or w/c? No  Grab bars in the bathroom? No  Shower chair or bench in shower? No  Elevated toilet seat or a handicapped toilet? No   TIMED UP AND GO:  Was the test performed? No . Phone visit   Cognitive Function:Normal cognitive status assessed by this Nurse Health Advisor. No abnormalities found.       6CIT Screen 06/07/2020 06/05/2019  What Year? 0 points 0 points  What month? 0 points 0 points  What time? 0 points 0 points  Count back from 20 0 points 0 points  Months in reverse 0 points 0 points  Repeat phrase 0 points 0 points  Total Score 0 0    Immunizations Immunization History  Administered Date(s) Administered   PFIZER(Purple Top)SARS-COV-2 Vaccination 11/17/2018, 12/09/2019, 08/06/2020   Pneumococcal Conjugate-13 07/13/2020   Td 01/18/2005   Tdap 07/18/2021    TDAP status: Up to date  Flu Vaccine status: Declined, Education has been provided regarding the importance of this vaccine but patient still declined. Advised may receive this vaccine at local pharmacy or Health Dept. Aware to provide a copy of the vaccination record if obtained from local pharmacy or Health Dept. Verbalized acceptance and understanding.  Pneumococcal vaccine status: Due, Education has been provided regarding the importance of this vaccine. Advised may receive this vaccine at local pharmacy or Health Dept. Aware to provide a copy of the vaccination record if obtained from local pharmacy or Health Dept. Verbalized acceptance and understanding.  Covid-19 vaccine status: Information provided on how to obtain vaccines.   Qualifies for Shingles Vaccine? Yes   Zostavax completed No   Shingrix Completed?: No.    Education has been provided regarding the importance of this vaccine. Patient has been advised to call insurance company to determine out of pocket expense if they have not yet received this vaccine. Advised may also receive vaccine at local pharmacy or Health Dept. Verbalized acceptance and understanding.  Screening Tests Health Maintenance  Topic Date Due   OPHTHALMOLOGY EXAM  Never done   Zoster Vaccines- Shingrix (1 of 2) Never done   COVID-19 Vaccine (4 - Booster for Pfizer series) 10/01/2020   INFLUENZA VACCINE  Never done   Pneumonia Vaccine 51+ Years old (2 - PPSV23 if available, else PCV20) 07/13/2021   HEMOGLOBIN A1C  01/15/2022   FOOT EXAM  07/18/2022   URINE MICROALBUMIN  07/18/2022   Fecal DNA (Cologuard)  06/25/2023   MAMMOGRAM  08/05/2023   TETANUS/TDAP  07/19/2031   DEXA SCAN  Completed   Hepatitis C  Screening  Completed   HPV VACCINES  Aged Out    Health Maintenance  Health Maintenance Due  Topic Date Due   OPHTHALMOLOGY EXAM  Never done   Zoster Vaccines- Shingrix (1 of 2) Never done   COVID-19 Vaccine (4 - Booster for Pfizer series) 10/01/2020   INFLUENZA VACCINE  Never done   Pneumonia Vaccine 75+ Years old (2 - PPSV23 if available, else PCV20) 07/13/2021    Colorectal cancer screening: Type of screening: Cologuard. Completed 06/24/2020. Repeat every 3 years  Mammogram status: Completed bilateral 08/04/2021. Repeat every year  Bone Density status: Completed 08/04/2021. Results reflect: Bone density results: NORMAL. Repeat every 2 years.  Lung Cancer Screening: (Low Dose CT Chest recommended if Age 69-80 years, 30 pack-year currently smoking OR have quit w/in 15years.) does not qualify.     Additional Screening:  Hepatitis C Screening: Completed 10/10/2014  Vision Screening: Recommended annual ophthalmology exams for early detection of glaucoma and other disorders of the eye. Is the patient up to date with their annual eye exam?  Yes  Who is the provider or what is the name of the office in which the patient attends annual eye exams? Dr. Nicki Reaper   Dental Screening: Recommended annual dental exams for proper oral hygiene  Community Resource Referral / Chronic Care Management: CRR required this visit?  No   CCM required this visit?  No      Plan:     I have personally reviewed and noted the following in the patient's chart:   Medical and social history Use of alcohol, tobacco or illicit drugs  Current  medications and supplements including opioid prescriptions.  Functional ability and status Nutritional status Physical activity Advanced directives List of other physicians Hospitalizations, surgeries, and ER visits in previous 12 months Vitals Screenings to include cognitive, depression, and falls Referrals and appointments  In addition, I have reviewed and  discussed with patient certain preventive protocols, quality metrics, and best practice recommendations. A written personalized care plan for preventive services as well as general preventive health recommendations were provided to patient.   Due to this being a telephonic visit, the after visit summary with patients personalized plan was offered to patient via mail or my-chart. Patient would like to access on my-chart.   Marta Antu, LPN   30/05/5693  Nurse Health Advisor  Nurse Notes: None

## 2021-11-04 DIAGNOSIS — L2084 Intrinsic (allergic) eczema: Secondary | ICD-10-CM | POA: Diagnosis not present

## 2021-11-04 DIAGNOSIS — Z7952 Long term (current) use of systemic steroids: Secondary | ICD-10-CM | POA: Diagnosis not present

## 2021-11-04 DIAGNOSIS — L209 Atopic dermatitis, unspecified: Secondary | ICD-10-CM | POA: Diagnosis not present

## 2021-11-04 DIAGNOSIS — Z5181 Encounter for therapeutic drug level monitoring: Secondary | ICD-10-CM | POA: Diagnosis not present

## 2021-12-01 DIAGNOSIS — Z5181 Encounter for therapeutic drug level monitoring: Secondary | ICD-10-CM | POA: Diagnosis not present

## 2021-12-01 DIAGNOSIS — L2084 Intrinsic (allergic) eczema: Secondary | ICD-10-CM | POA: Diagnosis not present

## 2021-12-29 DIAGNOSIS — H2513 Age-related nuclear cataract, bilateral: Secondary | ICD-10-CM | POA: Diagnosis not present

## 2022-02-17 DIAGNOSIS — Z5181 Encounter for therapeutic drug level monitoring: Secondary | ICD-10-CM | POA: Diagnosis not present

## 2022-02-17 DIAGNOSIS — L2084 Intrinsic (allergic) eczema: Secondary | ICD-10-CM | POA: Diagnosis not present

## 2022-05-10 DIAGNOSIS — H00022 Hordeolum internum right lower eyelid: Secondary | ICD-10-CM | POA: Diagnosis not present

## 2022-06-09 ENCOUNTER — Other Ambulatory Visit: Payer: Self-pay | Admitting: Family Medicine

## 2022-06-09 DIAGNOSIS — I10 Essential (primary) hypertension: Secondary | ICD-10-CM

## 2022-08-04 ENCOUNTER — Other Ambulatory Visit: Payer: Self-pay | Admitting: Family Medicine

## 2022-08-04 DIAGNOSIS — R0981 Nasal congestion: Secondary | ICD-10-CM

## 2022-08-10 DIAGNOSIS — Z1231 Encounter for screening mammogram for malignant neoplasm of breast: Secondary | ICD-10-CM | POA: Diagnosis not present

## 2022-08-10 LAB — HM MAMMOGRAPHY

## 2022-08-14 ENCOUNTER — Encounter: Payer: Self-pay | Admitting: Family Medicine

## 2022-08-24 DIAGNOSIS — Z5181 Encounter for therapeutic drug level monitoring: Secondary | ICD-10-CM | POA: Diagnosis not present

## 2022-08-24 DIAGNOSIS — Q809 Congenital ichthyosis, unspecified: Secondary | ICD-10-CM | POA: Diagnosis not present

## 2022-08-24 DIAGNOSIS — L2084 Intrinsic (allergic) eczema: Secondary | ICD-10-CM | POA: Diagnosis not present

## 2022-08-26 ENCOUNTER — Other Ambulatory Visit: Payer: Self-pay | Admitting: Family Medicine

## 2022-08-26 DIAGNOSIS — R0981 Nasal congestion: Secondary | ICD-10-CM

## 2022-08-28 ENCOUNTER — Encounter: Payer: Self-pay | Admitting: *Deleted

## 2022-09-09 NOTE — Patient Instructions (Incomplete)
It was great to see again today, I will be in touch with your labs soon as possible Recommend the latest COVID-19 booster, dose of RSV this fall

## 2022-09-09 NOTE — Progress Notes (Unsigned)
[place] at [facility] [address] [place], [place] [address]  [phone]  Date:  09/13/2022   Name:  KHAMARI YOUSUF   DOB:  1950/12/14   MRN:  [id number]  PCP:  Darreld Mclean, MD    Chief Complaint: No chief complaint on file.   History of Present Illness:  EVALEIGH MCCAMY is a 71 y.o. very pleasant female patient who presents with the following:  Patient seen today for physical exam Most recent visit with myself about 1 year ago History of DM, HTN and breast cancer, eczema. She is no longer followed by oncology - just does annual mammo  Married to Crystal, she is retired  [employer] COVID-19 booster recommended, RSV recommended Can give dose of Pneumovax A1c, urine micro due Foot exam due Cologuard completed 2021 Mammogram done last month Patient Active Problem List   Diagnosis Date Noted   Controlled type 2 diabetes mellitus without complication, without long-term current use of insulin ([place]) 03/15/2021   Chest pain 05/17/2012   Abnormal resting ECG findings 04/24/2012   Vitamin D deficiency 04/24/2012   Hypertension 02/11/2012   Breast cancer ([place]) 02/11/2012   Eczema 02/11/2012   Allergic rhinitis due to allergen 02/11/2012    Past Medical History:  Diagnosis Date   Anxiety    no meds   Blood transfusion 1979   with missed abortion in [place] -Dr Heber [place]   Cancer [facility])    Hypertension    Missed abortion    x 1   Seasonal allergies    Termination of pregnancy    x 1    Past Surgical History:  Procedure Laterality Date   BREAST SURGERY  10/03/2007, 11/06/2007   Left breast excision, re-excision Left breast   BREAST SURGERY     No IV sticks/Blood Draws/Blood Pressure Left Arm,   CESAREAN SECTION     CESAREAN SECTION N/A    Phreesia 04/30/2020   CHOLECYSTECTOMY     colonscopy     DILATION AND CURETTAGE OF UTERUS     HYSTEROSCOPY WITH D & C  10/27/2011   Procedure:  DILATATION AND CURETTAGE /HYSTEROSCOPY;  Surgeon: Delice Lesch, MD;  Location: [facility];  Service: Gynecology;  Laterality: N/A;   WISDOM TOOTH EXTRACTION      Social History   Tobacco Use   Smoking status: Former    Packs/day: 0.25    Years: 2.00    Total pack years: 0.50    Types: Cigarettes    Quit date: 10/30/1976    Years since quitting: 45.8   Smokeless tobacco: Never  Substance Use Topics   Alcohol use: Yes    Comment: occasionally   Drug use: No    Family History  Problem Relation Age of Onset   Cancer Mother 16       breast (R),stomach, and uterine   Cancer Father 78       lung from smoking  3 pks/day   Diabetes Maternal Grandmother        amputation of (L) leg   Stroke Maternal Grandfather 60   Diabetes Brother     Allergies  Allergen Reactions   Morphine And Related Other (See Comments)    "psychotic" reaction per pt report   Shellfish Allergy Swelling   Latex Rash    itching    Medication list has been reviewed and updated.  Current Outpatient Medications on File Prior to Visit  Medication Sig Dispense Refill   betamethasone dipropionate (DIPROLENE) 0.05 % ointment Apply topically 2 (two) times daily. Use as needed for eczema 60 g 3   fluticasone (FLONASE) 50 MCG/ACT nasal spray Place 1-2 sprays into both nostrils daily. 16 g 0   hydrochlorothiazide (HYDRODIURIL) 25 MG tablet TAKE 1 TABLET (25 MG TOTAL) BY MOUTH DAILY. 90 tablet 3   No current facility-administered medications on file prior to visit.    Review of Systems:  As per HPI- otherwise negative.   Physical Examination: There were no vitals filed for this visit. There were no vitals filed for this visit. There is no height or weight on file to calculate BMI. Ideal Body Weight:    GEN: no acute distress. HEENT: Atraumatic, Normocephalic.  Ears and Nose: No external deformity. CV: RRR, No M/G/R. No JVD. No thrill. No extra heart sounds. PULM: CTA B, no wheezes, crackles, rhonchi.  No retractions. No resp. distress. No accessory muscle use. ABD: S, NT, ND, +BS. No rebound. No HSM. EXTR: No c/c/e PSYCH: Normally interactive. Conversant.    Assessment and Plan: ***  Signed Lamar Blinks, MD

## 2022-09-13 ENCOUNTER — Encounter: Payer: Self-pay | Admitting: Family Medicine

## 2022-09-13 ENCOUNTER — Ambulatory Visit (INDEPENDENT_AMBULATORY_CARE_PROVIDER_SITE_OTHER): Payer: Medicare Other | Admitting: Family Medicine

## 2022-09-13 VITALS — BP 128/80 | HR 79 | Temp 97.6°F | Resp 18 | Ht 64.0 in | Wt 190.2 lb

## 2022-09-13 DIAGNOSIS — I10 Essential (primary) hypertension: Secondary | ICD-10-CM

## 2022-09-13 DIAGNOSIS — Z13220 Encounter for screening for lipoid disorders: Secondary | ICD-10-CM

## 2022-09-13 DIAGNOSIS — R5383 Other fatigue: Secondary | ICD-10-CM

## 2022-09-13 DIAGNOSIS — E119 Type 2 diabetes mellitus without complications: Secondary | ICD-10-CM

## 2022-09-13 DIAGNOSIS — Z0000 Encounter for general adult medical examination without abnormal findings: Secondary | ICD-10-CM

## 2022-09-13 LAB — CBC
HCT: 41.9 % (ref 36.0–46.0)
Hemoglobin: 13.5 g/dL (ref 12.0–15.0)
MCHC: 32.2 g/dL (ref 30.0–36.0)
MCV: 82.9 fl (ref 78.0–100.0)
Platelets: 319 10*3/uL (ref 150.0–400.0)
RBC: 5.05 Mil/uL (ref 3.87–5.11)
RDW: 15 % (ref 11.5–15.5)
WBC: 6.3 10*3/uL (ref 4.0–10.5)

## 2022-09-13 LAB — LIPID PANEL
Cholesterol: 195 mg/dL (ref 0–200)
HDL: 59.7 mg/dL (ref >39.00)
LDL Cholesterol: 118 mg/dL — ABNORMAL HIGH (ref 0–99)
NonHDL: 135.78
Total CHOL/HDL Ratio: 3
Triglycerides: 90 mg/dL (ref 0.0–149.0)
VLDL: 18 mg/dL (ref 0.0–40.0)

## 2022-09-13 LAB — VITAMIN D 25 HYDROXY (VIT D DEFICIENCY, FRACTURES): VITD: 35.67 ng/mL (ref 30.00–100.00)

## 2022-09-13 LAB — COMPREHENSIVE METABOLIC PANEL
ALT: 18 U/L (ref 0–35)
AST: 19 U/L (ref 0–37)
Albumin: 4.1 g/dL (ref 3.5–5.2)
Alkaline Phosphatase: 72 U/L (ref 39–117)
BUN: 12 mg/dL (ref 6–23)
CO2: 33 mEq/L — ABNORMAL HIGH (ref 19–32)
Calcium: 9.8 mg/dL (ref 8.4–10.5)
Chloride: 100 mEq/L (ref 96–112)
Creatinine, Ser: 0.76 mg/dL (ref 0.40–1.20)
GFR: 78.44 mL/min (ref >60.00)
Glucose, Bld: 108 mg/dL — ABNORMAL HIGH (ref 70–99)
Potassium: 3.7 mEq/L (ref 3.5–5.1)
Sodium: 139 mEq/L (ref 135–145)
Total Bilirubin: 0.6 mg/dL (ref 0.2–1.2)
Total Protein: 7.4 g/dL (ref 6.0–8.3)

## 2022-09-13 LAB — MICROALBUMIN / CREATININE URINE RATIO
Creatinine,U: 169.5 mg/dL
Microalb Creat Ratio: 0.6 mg/g (ref 0.0–30.0)
Microalb, Ur: 1 mg/dL (ref 0.0–1.9)

## 2022-09-13 LAB — TSH: TSH: 2.01 u[IU]/mL (ref 0.35–5.50)

## 2022-09-13 LAB — HEMOGLOBIN A1C: Hgb A1c MFr Bld: 6.8 % — ABNORMAL HIGH (ref 4.6–6.5)

## 2022-09-25 ENCOUNTER — Telehealth: Payer: Self-pay | Admitting: Family Medicine

## 2022-09-25 NOTE — Telephone Encounter (Signed)
Left message for patient to call back and schedule Medicare Annual Wellness Visit (AWV) either virtually or phone . Left  my Toni Williams number [phone]   Last AWV  09/29/21 please schedule with Nurse Health Adviser 2   45 min for awv-i and in office appointments 30 min for awv-s  phone/virtual appointments

## 2022-09-26 NOTE — Telephone Encounter (Signed)
Returned patients call   Patient returned my call 11/28

## 2022-10-10 ENCOUNTER — Telehealth: Payer: Medicare Other | Admitting: Family Medicine

## 2022-10-10 DIAGNOSIS — Z91199 Patient's noncompliance with other medical treatment and regimen due to unspecified reason: Secondary | ICD-10-CM

## 2022-10-10 NOTE — Progress Notes (Signed)
My assistant and I both tried to contact this patient multiple times for her virtual visit and left a number of messages. Also stayed in virtual space until 10 minutes after appt time. LM to please call our office to reschedule since we were unable to reach her.

## 2022-11-29 ENCOUNTER — Encounter: Payer: Self-pay | Admitting: *Deleted

## 2022-12-15 ENCOUNTER — Ambulatory Visit (INDEPENDENT_AMBULATORY_CARE_PROVIDER_SITE_OTHER): Payer: Medicare Other | Admitting: *Deleted

## 2022-12-15 DIAGNOSIS — Z0000 Encounter for general adult medical examination without abnormal findings: Secondary | ICD-10-CM

## 2022-12-15 NOTE — Patient Instructions (Signed)
Toni Williams , Thank you for taking time to come for your Medicare Wellness Visit. I appreciate your ongoing commitment to your health goals. Please review the following plan we discussed and let me know if I can assist you in the future.   These are the goals we discussed:  Goals      Patient Stated     Drink more water, eat healthier & increase exercise     Weight (lb) < 200 lb (90.7 kg)        This is a list of the screening recommended for you and due dates:  Health Maintenance  Topic Date Due   Pneumonia Vaccine (2 of 2 - PPSV23 or PCV20) 07/13/2021   COVID-19 Vaccine (4 - 2023-24 season) 04/14/2023*   Flu Shot  01/28/2028*   Hemoglobin A1C  03/14/2023   Cologuard (Stool DNA test)  06/25/2023   Eye exam for diabetics  07/30/2023   Yearly kidney function blood test for diabetes  09/14/2023   Yearly kidney health urinalysis for diabetes  09/14/2023   Complete foot exam   09/14/2023   Medicare Annual Wellness Visit  12/16/2023   Mammogram  08/10/2024   DTaP/Tdap/Td vaccine (3 - Td or Tdap) 07/19/2031   DEXA scan (bone density measurement)  Completed   Hepatitis C Screening: USPSTF Recommendation to screen - Ages 17-79 yo.  Completed   HPV Vaccine  Aged Out   Zoster (Shingles) Vaccine  Discontinued  *Topic was postponed. The date shown is not the original due date.     Next appointment: Follow up in one year for your annual wellness visit.   Preventive Care 75 Years and Older, Female Preventive care refers to lifestyle choices and visits with your health care provider that can promote health and wellness. What does preventive care include? A yearly physical exam. This is also called an annual well check. Dental exams once or twice a year. Routine eye exams. Ask your health care provider how often you should have your eyes checked. Personal lifestyle choices, including: Daily care of your teeth and gums. Regular physical activity. Eating a healthy diet. Avoiding  tobacco and drug use. Limiting alcohol use. Practicing safe sex. Taking low-dose aspirin every day. Taking vitamin and mineral supplements as recommended by your health care provider. What happens during an annual well check? The services and screenings done by your health care provider during your annual well check will depend on your age, overall health, lifestyle risk factors, and family history of disease. Counseling  Your health care provider may ask you questions about your: Alcohol use. Tobacco use. Drug use. Emotional well-being. Home and relationship well-being. Sexual activity. Eating habits. History of falls. Memory and ability to understand (cognition). Work and work Statistician. Reproductive health. Screening  You may have the following tests or measurements: Height, weight, and BMI. Blood pressure. Lipid and cholesterol levels. These may be checked every 5 years, or more frequently if you are over 50 years old. Skin check. Lung cancer screening. You may have this screening every year starting at age 27 if you have a 30-pack-year history of smoking and currently smoke or have quit within the past 15 years. Fecal occult blood test (FOBT) of the stool. You may have this test every year starting at age 91. Flexible sigmoidoscopy or colonoscopy. You may have a sigmoidoscopy every 5 years or a colonoscopy every 10 years starting at age 29. Hepatitis C blood test. Hepatitis B blood test. Sexually transmitted disease (STD) testing. Diabetes  screening. This is done by checking your blood sugar (glucose) after you have not eaten for a while (fasting). You may have this done every 1-3 years. Bone density scan. This is done to screen for osteoporosis. You may have this done starting at age 69. Mammogram. This may be done every 1-2 years. Talk to your health care provider about how often you should have regular mammograms. Talk with your health care provider about your test  results, treatment options, and if necessary, the need for more tests. Vaccines  Your health care provider may recommend certain vaccines, such as: Influenza vaccine. This is recommended every year. Tetanus, diphtheria, and acellular pertussis (Tdap, Td) vaccine. You may need a Td booster every 10 years. Zoster vaccine. You may need this after age 69. Pneumococcal 13-valent conjugate (PCV13) vaccine. One dose is recommended after age 48. Pneumococcal polysaccharide (PPSV23) vaccine. One dose is recommended after age 50. Talk to your health care provider about which screenings and vaccines you need and how often you need them. This information is not intended to replace advice given to you by your health care provider. Make sure you discuss any questions you have with your health care provider. Document Released: 11/12/2015 Document Revised: 07/05/2016 Document Reviewed: 08/17/2015 Elsevier Interactive Patient Education  2017 [place] Prevention in the Home Falls can cause injuries. They can happen to people of all ages. There are many things you can do to make your home safe and to help prevent falls. What can I do on the outside of my home? Regularly fix the edges of walkways and driveways and fix any cracks. Remove anything that might make you trip as you walk through a door, such as a raised step or threshold. Trim any bushes or trees on the path to your home. Use bright outdoor lighting. Clear any walking paths of anything that might make someone trip, such as rocks or tools. Regularly check to see if handrails are loose or broken. Make sure that both sides of any steps have handrails. Any raised decks and porches should have guardrails on the edges. Have any leaves, snow, or ice cleared regularly. Use sand or salt on walking paths during winter. Clean up any spills in your garage right away. This includes oil or grease spills. What can I do in the bathroom? Use night  lights. Install grab bars by the toilet and in the tub and shower. Do not use towel bars as grab bars. Use non-skid mats or decals in the tub or shower. If you need to sit down in the shower, use a plastic, non-slip stool. Keep the floor dry. Clean up any water that spills on the floor as soon as it happens. Remove soap buildup in the tub or shower regularly. Attach bath mats securely with double-sided non-slip rug tape. Do not have throw rugs and other things on the floor that can make you trip. What can I do in the bedroom? Use night lights. Make sure that you have a light by your bed that is easy to reach. Do not use any sheets or blankets that are too big for your bed. They should not hang down onto the floor. Have a firm chair that has side arms. You can use this for support while you get dressed. Do not have throw rugs and other things on the floor that can make you trip. What can I do in the kitchen? Clean up any spills right away. Avoid walking on wet floors. Keep  items that you use a lot in easy-to-reach places. If you need to reach something above you, use a strong step stool that has a grab bar. Keep electrical cords out of the way. Do not use floor polish or wax that makes floors slippery. If you must use wax, use non-skid floor wax. Do not have throw rugs and other things on the floor that can make you trip. What can I do with my stairs? Do not leave any items on the stairs. Make sure that there are handrails on both sides of the stairs and use them. Fix handrails that are broken or loose. Make sure that handrails are as long as the stairways. Check any carpeting to make sure that it is firmly attached to the stairs. Fix any carpet that is loose or worn. Avoid having throw rugs at the top or bottom of the stairs. If you do have throw rugs, attach them to the floor with carpet tape. Make sure that you have a light switch at the top of the stairs and the bottom of the stairs. If  you do not have them, ask someone to add them for you. What else can I do to help prevent falls? Wear shoes that: Do not have high heels. Have rubber bottoms. Are comfortable and fit you well. Are closed at the toe. Do not wear sandals. If you use a stepladder: Make sure that it is fully opened. Do not climb a closed stepladder. Make sure that both sides of the stepladder are locked into place. Ask someone to hold it for you, if possible. Clearly mark and make sure that you can see: Any grab bars or handrails. First and last steps. Where the edge of each step is. Use tools that help you move around (mobility aids) if they are needed. These include: Canes. Walkers. Scooters. Crutches. Turn on the lights when you go into a dark area. Replace any light bulbs as soon as they burn out. Set up your furniture so you have a clear path. Avoid moving your furniture around. If any of your floors are uneven, fix them. If there are any pets around you, be aware of where they are. Review your medicines with your doctor. Some medicines can make you feel dizzy. This can increase your chance of falling. Ask your doctor what other things that you can do to help prevent falls. This information is not intended to replace advice given to you by your health care provider. Make sure you discuss any questions you have with your health care provider. Document Released: 08/12/2009 Document Revised: 03/23/2016 Document Reviewed: 11/20/2014 Elsevier Interactive Patient Education  2017 [employer].

## 2022-12-15 NOTE — Progress Notes (Signed)
Subjective:  Pt completed ADLs, Fall risk, & SDOH during e-check in on 12/14/22. Answers verified with pt.    Toni Williams is a 72 y.o. female who presents for Medicare Annual (Subsequent) preventive examination.  I connected with  Toni Williams on 12/15/22 by a audio enabled telemedicine application and verified that I am speaking with the correct person using two identifiers.  Patient Location: Home  Provider Location: Office/Clinic  I discussed the limitations of evaluation and management by telemedicine. The patient expressed understanding and agreed to proceed.   Review of Systems    Defer to PCP Cardiac Risk Factors include: advanced age (>24mn, >>50women);diabetes mellitus;hypertension     Objective:    There were no vitals filed for this visit. There is no height or weight on file to calculate BMI.     12/15/2022    9:02 AM 09/29/2021    2:25 PM 06/07/2020    8:19 AM 05/03/2020   11:31 AM 06/05/2019    3:50 PM 02/12/2015    9:25 AM 10/18/2011   11:52 AM  Advanced Directives  Does Patient Have a Medical Advance Directive? No No No No No No Patient does not have advance directive  Would patient like information on creating a medical advance directive? No - Patient declined No - Patient declined Yes (ED - Information included in AVS)  Yes (MAU/Ambulatory/Procedural Areas - Information given) No - patient declined information     Current Medications (verified) Outpatient Encounter Medications as of 12/15/2022  Medication Sig   betamethasone dipropionate (DIPROLENE) 0.05 % ointment Apply topically 2 (two) times daily. Use as needed for eczema   fluticasone (FLONASE) 50 MCG/ACT nasal spray Place 1-2 sprays into both nostrils daily.   hydrochlorothiazide (HYDRODIURIL) 25 MG tablet TAKE 1 TABLET (25 MG TOTAL) BY MOUTH DAILY.   No facility-administered encounter medications on file as of 12/15/2022.    Allergies (verified) Morphine and related, Shellfish  allergy, and Latex   History: Past Medical History:  Diagnosis Date   Anxiety    no meds   Blood transfusion 1979   with missed abortion in [place] -Dr HHeber Temple  Cancer ([facility]    Hypertension    Missed abortion    x 1   Seasonal allergies    Termination of pregnancy    x 1   Past Surgical History:  Procedure Laterality Date   BREAST SURGERY  10/03/2007, 11/06/2007   Left breast excision, re-excision Left breast   BREAST SURGERY     No IV sticks/Blood Draws/Blood Pressure Left Arm,   CESAREAN SECTION     CESAREAN SECTION N/A    [employer] 04/30/2020   CHOLECYSTECTOMY     colonscopy     DILATION AND CURETTAGE OF UTERUS     HYSTEROSCOPY WITH D & C  10/27/2011   Procedure: DILATATION AND CURETTAGE /HYSTEROSCOPY;  Surgeon: ADelice Lesch MD;  Location: [facility];  Service: Gynecology;  Laterality: N/A;   WISDOM TOOTH EXTRACTION     Family History  Problem Relation Age of Onset   Cancer Mother 733      breast (R),stomach, and uterine   Cancer Father 572      lung from smoking  3 pks/day   Diabetes Maternal Grandmother        amputation of (L) leg   Stroke Maternal Grandfather 632  Diabetes Brother    Social History   Socioeconomic History   Marital status: Married  Spouse name: Not on file   Number of children: Not on file   Years of education: Not on file   Highest education level: Not on file  Occupational History   Not on file  Tobacco Use   Smoking status: Former    Packs/day: 0.25    Years: 2.00    Total pack years: 0.50    Types: Cigarettes    Quit date: 10/30/1976    Years since quitting: 46.1   Smokeless tobacco: Never  Substance and Sexual Activity   Alcohol use: Yes    Comment: occasionally   Drug use: No   Sexual activity: Not Currently    Birth control/protection: None  Other Topics Concern   Not on file  Social History Narrative   Not on file   Social Determinants of Health   Financial Resource Strain: Low Risk  (12/14/2022)   Overall  Financial Resource Strain (CARDIA)    Difficulty of Paying Living Expenses: Not hard at all  Food Insecurity: No Food Insecurity (12/14/2022)   Hunger Vital Sign    Worried About Running Out of Food in the Last Year: Never true    Ran Out of Food in the Last Year: Never true  Transportation Needs: No Transportation Needs (12/14/2022)   PRAPARE - Hydrologist (Medical): No    Lack of Transportation (Non-Medical): No  Physical Activity: Insufficiently Active (12/14/2022)   Exercise Vital Sign    Days of Exercise per Week: 3 days    Minutes of Exercise per Session: 40 min  Stress: No Stress Concern Present (12/14/2022)   [place]    Feeling of Stress : Not at all  Social Connections: [place] (12/14/2022)   Social Connection and Isolation Panel [NHANES]    Frequency of Communication with Friends and Family: More than three times a week    Frequency of Social Gatherings with Friends and Family: Once a week    Attends Religious Services: More than 4 times per year    Active Member of [employer] or Organizations: Yes    Attends Music therapist: More than 4 times per year    Marital Status: Married    Tobacco Counseling Counseling given: Not Answered   Clinical Intake:  Pre-visit preparation completed: Yes  Pain : No/denies pain  Nutritional Risks: None Diabetes: Yes CBG done?: No Did pt. bring in CBG monitor from home?: No (audio visit)  How often do you need to have someone help you when you read instructions, pamphlets, or other written materials from your doctor or pharmacy?: 1 - Never   Activities of Daily Living    12/14/2022    1:32 PM  In your present state of health, do you have any difficulty performing the following activities:  Hearing? 0  Vision? 0  Difficulty concentrating or making decisions? 0  Walking or climbing stairs? 0  Dressing or bathing?  0  Doing errands, shopping? 0  Preparing Food and eating ? N  Using the Toilet? N  In the past six months, have you accidently leaked urine? N  Do you have problems with loss of bowel control? N  Managing your Medications? N  Managing your Finances? N  Housekeeping or managing your Housekeeping? N    Patient Care Team: Copland, Gay Filler, MD as PCP - General (Family Medicine) Sueanne Margarita, MD as PCP - Cardiology (Cardiology) Kennith Center, RD as Dietitian [facility])  Jari Pigg, MD as Consulting Physician (Dermatology) Everett Graff, MD as Consulting Physician (Obstetrics and Gynecology)  Indicate any recent Medical Services you may have received from other than [facility] providers in the past year (date may be approximate).     Assessment:   This is a routine wellness examination for [place].  Hearing/Vision screen No results found.  Dietary issues and exercise activities discussed: Current Exercise Habits: Home exercise routine, Type of exercise: walking;treadmill, Time (Minutes): 40, Frequency (Times/Week): 3, Weekly Exercise (Minutes/Week): 120, Intensity: Moderate, Exercise limited by: None identified   Goals Addressed   None    Depression Screen    12/15/2022    9:03 AM 09/13/2022   11:07 AM 09/29/2021    2:28 PM 07/18/2021   10:43 AM 06/10/2020   10:43 AM 06/07/2020    8:25 AM 05/10/2020   11:09 AM  PHQ 2/9 Scores  PHQ - 2 Score 0 0 0 0 0 0 0  Exception Documentation    Medical reason       Fall Risk    12/14/2022    1:32 PM 09/13/2022   11:07 AM 09/29/2021    2:26 PM 07/18/2021   10:43 AM 06/10/2020   10:42 AM  Fall Risk   Falls in the past year? 0 0 0 0 0  Number falls in past yr: 0 0 0 0 0  Injury with Fall? 0  0 0 0  Risk for fall due to : No Fall Risks    No Fall Risks  Follow up Falls evaluation completed  Falls prevention discussed  Falls evaluation completed    [place]:  Any stairs in or around the  home? Yes  If so, are there any without handrails? No  Home free of loose throw rugs in walkways, pet beds, electrical cords, etc? Yes  Adequate lighting in your home to reduce risk of falls? Yes   ASSISTIVE DEVICES UTILIZED TO PREVENT FALLS:  Life alert? No  Use of a cane, walker or w/c? No  Grab bars in the bathroom? No  Shower chair or bench in shower? No  Elevated toilet seat or a handicapped toilet? No   TIMED UP AND GO:  Was the test performed?  No, audio visit .    Cognitive Function:        12/15/2022    9:06 AM 06/07/2020    8:19 AM 06/05/2019    3:49 PM  6CIT Screen  What Year? 0 points 0 points 0 points  What month? 0 points 0 points 0 points  What time? 0 points 0 points 0 points  Count back from 20 0 points 0 points 0 points  Months in reverse 0 points 0 points 0 points  Repeat phrase 0 points 0 points 0 points  Total Score 0 points 0 points 0 points    Immunizations Immunization History  Administered Date(s) Administered   PFIZER(Purple Top)SARS-COV-2 Vaccination 11/17/2018, 12/09/2019, 08/06/2020   Pneumococcal Conjugate-13 07/13/2020   Td 01/18/2005   Tdap 07/18/2021    TDAP status: Up to date  Flu Vaccine status: Declined, Education has been provided regarding the importance of this vaccine but patient still declined. Advised may receive this vaccine at local pharmacy or Health Dept. Aware to provide a copy of the vaccination record if obtained from local pharmacy or Health Dept. Verbalized acceptance and understanding.  Pneumococcal vaccine status: Due, Education has been provided regarding the importance of this vaccine. Advised may receive this vaccine  at local pharmacy or Health Dept. Aware to provide a copy of the vaccination record if obtained from local pharmacy or Health Dept. Verbalized acceptance and understanding.  Covid-19 vaccine status: Declined, Education has been provided regarding the importance of this vaccine but patient still  declined. Advised may receive this vaccine at local pharmacy or Health Dept.or vaccine clinic. Aware to provide a copy of the vaccination record if obtained from local pharmacy or Health Dept. Verbalized acceptance and understanding.  Qualifies for Shingles Vaccine? Yes   Zostavax completed No   Shingrix Completed?: No.    Education has been provided regarding the importance of this vaccine. Patient has been advised to call insurance company to determine out of pocket expense if they have not yet received this vaccine. Advised may also receive vaccine at local pharmacy or Health Dept. Verbalized acceptance and understanding.  Screening Tests Health Maintenance  Topic Date Due   Pneumonia Vaccine 48+ Years old (2 of 2 - PPSV23 or PCV20) 07/13/2021   Medicare Annual Wellness (AWV)  09/29/2022   COVID-19 Vaccine (4 - 2023-24 season) 04/14/2023 (Originally 06/30/2022)   INFLUENZA VACCINE  01/28/2028 (Originally 05/30/2022)   HEMOGLOBIN A1C  03/14/2023   Fecal DNA (Cologuard)  06/25/2023   OPHTHALMOLOGY EXAM  07/30/2023   Diabetic kidney evaluation - eGFR measurement  09/14/2023   Diabetic kidney evaluation - Urine ACR  09/14/2023   FOOT EXAM  09/14/2023   MAMMOGRAM  08/10/2024   DTaP/Tdap/Td (3 - Td or Tdap) 07/19/2031   DEXA SCAN  Completed   Hepatitis C Screening  Completed   HPV VACCINES  Aged Out   Zoster Vaccines- Shingrix  Discontinued    Health Maintenance  Health Maintenance Due  Topic Date Due   Pneumonia Vaccine 3+ Years old (2 of 2 - PPSV23 or PCV20) 07/13/2021   Medicare Annual Wellness (AWV)  09/29/2022    Colorectal cancer screening: Type of screening: Cologuard. Completed 06/24/20. Repeat every 3 years  Mammogram status: Completed 08/10/22. Repeat every year  Bone Density status: Completed 08/04/21. Results reflect: Bone density results: NORMAL. Repeat every 2 years.  Lung Cancer Screening: (Low Dose CT Chest recommended if Age 23-80 years, 30 pack-year currently  smoking OR have quit w/in 15years.) does not qualify.  Additional Screening:  Hepatitis C Screening: does qualify; Completed 10/10/14  Vision Screening: Recommended annual ophthalmology exams for early detection of glaucoma and other disorders of the eye. Is the patient up to date with their annual eye exam?  Yes  Who is the provider or what is the name of the office in which the patient attends annual eye exams? Dr. Luretha Rued If pt is not established with a provider, would they like to be referred to a provider to establish care? No .   Dental Screening: Recommended annual dental exams for proper oral hygiene  Community Resource Referral / Chronic Care Management: CRR required this visit?  No   CCM required this visit?  No      Plan:     I have personally reviewed and noted the following in the patient's chart:   Medical and social history Use of alcohol, tobacco or illicit drugs  Current medications and supplements including opioid prescriptions. Patient is not currently taking opioid prescriptions. Functional ability and status Nutritional status Physical activity Advanced directives List of other physicians Hospitalizations, surgeries, and ER visits in previous 12 months Vitals Screenings to include cognitive, depression, and falls Referrals and appointments  In addition, I have reviewed and discussed  with patient certain preventive protocols, quality metrics, and best practice recommendations. A written personalized care plan for preventive services as well as general preventive health recommendations were provided to patient.   Due to this being a telephonic visit, the after visit summary with patients personalized plan was offered to patient via mail or my-chart. Patient would like to access on my-chart.  Beatris Ship, [place]   12/15/2022   Nurse Notes: None

## 2022-12-27 ENCOUNTER — Encounter: Payer: Self-pay | Admitting: Family Medicine

## 2022-12-28 ENCOUNTER — Encounter: Payer: Self-pay | Admitting: Family Medicine

## 2022-12-28 ENCOUNTER — Ambulatory Visit (INDEPENDENT_AMBULATORY_CARE_PROVIDER_SITE_OTHER): Payer: Medicare Other | Admitting: Family Medicine

## 2022-12-28 VITALS — BP 127/80 | HR 96 | Temp 98.2°F | Resp 18 | Ht 64.0 in | Wt 189.0 lb

## 2022-12-28 DIAGNOSIS — R238 Other skin changes: Secondary | ICD-10-CM | POA: Diagnosis not present

## 2022-12-28 NOTE — Progress Notes (Signed)
   Acute Office Visit  Subjective:     Patient ID: Toni Williams, female    DOB: Mar 15, 1951, 72 y.o.   MRN: [id number]  Chief Complaint  Patient presents with   Rash    Inner thigh x 1-2 weeks.      Patient is in today for skin irritation.    Patient reports that about 2 weeks ago she was wearing tights/stockings that were too tight for too long. States it has felt itchy a few times, but otherwise just uncomfortable. She denies any pain, spreading rashes, open lesions, warmth, inflammation. States she has very sensitive skin at baseline. History of eczema, but never to this area.       ROS:  A comprehensive ROS was completed and negative except as noted per HPI         Objective:    BP 127/80 (BP Location: Left Arm, Patient Position: Sitting, Cuff Size: Large)   Pulse 96   Temp 98.2 F (36.8 C) (Oral)   Resp 18   Ht 5' 4"$  (1.626 m)   Wt 189 lb (85.7 kg)   SpO2 99%   BMI 32.44 kg/m    Physical Exam Vitals reviewed.  Constitutional:      Appearance: Normal appearance.  Skin:    General: Skin is warm and dry.     Comments: Bilateral upper inner thighs with dry skin and hyperpigmentation, no raised or open lesions, streaking, warmth, inflammation  Neurological:     General: No focal deficit present.     Mental Status: She is alert and oriented to person, place, and time. Mental status is at baseline.  Psychiatric:        Mood and Affect: Mood normal.        Behavior: Behavior normal.        Thought Content: Thought content normal.        Judgment: Judgment normal.     No results found for any visits on 12/28/22.      Assessment & Plan:   Problem List Items Addressed This Visit   None Visit Diagnoses     Skin irritation    -  Primary Likely friction related from tight clothing. If itchy, can try occasional hydrocortisone cream (no more than twice daily for 10 days max). Keep clean and pat dry. No signs of allergic reaction or yeast/bacterial  infection. Recommend wearing loose clothing and have skin open to air as much as possible. Patient aware of signs/symptoms requiring further/urgent evaluation.        No orders of the defined types were placed in this encounter.   Return if symptoms worsen or fail to improve.  Terrilyn Saver, NP

## 2023-02-14 ENCOUNTER — Other Ambulatory Visit: Payer: Self-pay | Admitting: Family Medicine

## 2023-02-14 DIAGNOSIS — R0981 Nasal congestion: Secondary | ICD-10-CM

## 2023-04-24 ENCOUNTER — Other Ambulatory Visit: Payer: Self-pay | Admitting: Family Medicine

## 2023-05-13 ENCOUNTER — Other Ambulatory Visit: Payer: Self-pay | Admitting: Family Medicine

## 2023-05-13 DIAGNOSIS — R0981 Nasal congestion: Secondary | ICD-10-CM

## 2023-06-16 ENCOUNTER — Other Ambulatory Visit: Payer: Self-pay | Admitting: Family Medicine

## 2023-06-16 DIAGNOSIS — I10 Essential (primary) hypertension: Secondary | ICD-10-CM

## 2023-07-04 LAB — COLOGUARD: COLOGUARD: NEGATIVE

## 2023-07-04 LAB — EXTERNAL GENERIC LAB PROCEDURE: COLOGUARD: NEGATIVE

## 2023-07-12 NOTE — Patient Instructions (Addendum)
It was good to see you again today -I will be in touch with your lab work as soon as possible  Recommend flu shot, COVID booster this fall.  You got your pneumonia booster today  I would encourage you to think about doing a CT coronary calcium scan, let me know if you are interested  I sent in a refill of your steroid cream, betamethasone to try for the skin lesion on your left leg.  However, please also do set up an appointment with your dermatologist for follow-up

## 2023-07-12 NOTE — Progress Notes (Signed)
[facility] at [facility] [address], [address] [place], [place] [address] [phone]  Date:  07/16/2023   Name:  Toni Williams   DOB:  20-Feb-1951   MRN:  [id number]  PCP:  Pearline Cables, MD    Chief Complaint: 6 month follow up (Concerns/ questions: issue on the L leg /Pna vaccine: none/A1C due)   History of Present Illness:  Toni Williams is a 72 y.o. very pleasant female patient who presents with the following:  Pt seen today for 6 month recheck Last seen by myself in November  History of diet controlled DM, HTN and breast cancer, eczema. 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 Value Date   HGBA1C 6.8 (H) 09/13/2022    Recommend covid booster and flu shot this fall  Recommend pneumonia booster- give today  Cologuard negative 8/24 Mammo due next month at [facility]   She has noted a hyperpigemented lesion on her left posterior calf which has been present for about 2 months- she treated with OTC anti-bacterial ointment     Patient Active Problem List   Diagnosis Date Noted   Controlled type 2 diabetes mellitus without complication, without long-term current use of insulin (HCC) 03/15/2021   Chest pain 05/17/2012   Abnormal resting ECG findings 04/24/2012   Vitamin D deficiency 04/24/2012   Hypertension 02/11/2012   Breast cancer (HCC) 02/11/2012   Eczema 02/11/2012   Allergic rhinitis due to allergen 02/11/2012    Past Medical History:  Diagnosis Date   Anxiety    no meds   Blood transfusion 1979   with missed abortion in [place] -Dr Mikey Bussing   Cancer [facility])    Hypertension    Missed abortion    x 1   Seasonal allergies    Termination  of pregnancy    x 1    Past Surgical History:  Procedure Laterality Date   BREAST SURGERY  10/03/2007, 11/06/2007   Left breast excision, re-excision Left breast   BREAST SURGERY     No IV sticks/Blood Draws/Blood Pressure Left Arm,   CESAREAN SECTION     CESAREAN SECTION N/A    Phreesia 04/30/2020   CHOLECYSTECTOMY     colonscopy     DILATION AND CURETTAGE OF UTERUS     HYSTEROSCOPY WITH D & C  10/27/2011   Procedure: DILATATION AND CURETTAGE /HYSTEROSCOPY;  Surgeon: Purcell Nails, MD;  Location: [facility];  Service: Gynecology;  Laterality: N/A;   WISDOM TOOTH EXTRACTION      Social History   Tobacco Use   Smoking status: Former    Current packs/day: 0.00    Average packs/day: 0.3 packs/day for 2.0 years (0.5 ttl pk-yrs)    Types: Cigarettes    Start date: 10/30/1974    Quit date: 10/30/1976    Years since quitting: 46.7   Smokeless tobacco: Never  Substance Use Topics   Alcohol use: Yes    Comment: occasionally   Drug use: No    Family History  Problem Relation Age of Onset   Cancer Mother 45       breast (R),stomach, and uterine  Cancer Father 68       lung from smoking  3 pks/day   Diabetes Maternal Grandmother        amputation of (L) leg   Stroke Maternal Grandfather 60   Diabetes Brother     Allergies  Allergen Reactions   Morphine And Codeine Other (See Comments)    "psychotic" reaction per pt report   Shellfish Allergy Swelling   Latex Rash    itching    Medication list has been reviewed and updated.  Current Outpatient Medications on File Prior to Visit  Medication Sig Dispense Refill   betamethasone dipropionate (DIPROLENE) 0.05 % ointment Apply topically 2 (two) times daily. Use as needed for eczema 60 g 3   fluticasone (FLONASE) 50 MCG/ACT nasal spray PLACE 1-2 SPRAYS INTO BOTH NOSTRILS DAILY. 48 mL 0   hydrochlorothiazide (HYDRODIURIL) 25 MG tablet TAKE 1 TABLET (25 MG TOTAL) BY MOUTH DAILY. 90 tablet 1   hydrocortisone 2.5 % cream APPLY  TOPICALLY 2 TIMES DAILY. APPLY LIGHTLY ON FACE IF NEEDED 28 g 3   No current facility-administered medications on file prior to visit.    Review of Systems:  As per HPI- otherwise negative.   Physical Examination: Vitals:   07/16/23 1015  BP: 122/80  Pulse: 79  Resp: 18  Temp: 98 F (36.7 C)  SpO2: 98%   Vitals:   07/16/23 1015  Weight: 188 lb (85.3 kg)  Height: 5\' 4"  (1.626 m)   Body mass index is 32.27 kg/m. Ideal Body Weight: Weight in (lb) to have BMI = 25: 145.3  GEN: no acute distress. Obese, looks well  HEENT: Atraumatic, Normocephalic.  PEERL Ears and Nose: No external deformity. CV: RRR, No M/G/R. No JVD. No thrill. No extra heart sounds. PULM: CTA B, no wheezes, crackles, rhonchi. No retractions. No resp. distress. No accessory muscle use. ABD: S, NT, ND, +BS. No rebound. No HSM. EXTR: No c/c/e PSYCH: Normally interactive. Conversant.  Eczema is present on her skin, especially left hand.  The patient points out two well-demarcated flat hyperpigmented macules on her left calf.  1 in the size of a nickel, the other slightly larger  Assessment and Plan: Essential hypertension - Plan: CBC, Comprehensive metabolic panel  Controlled type 2 diabetes mellitus without complication, without long-term current use of insulin (HCC) - Plan: Comprehensive metabolic panel, Hemoglobin A1c  Screening for hyperlipidemia - Plan: Lipid panel  Thyroid disorder screening - Plan: TSH  Eczema, unspecified type - Plan: betamethasone dipropionate (DIPROLENE) 0.05 % ointment  Immunization due - Plan: Pneumococcal conjugate vaccine 20-valent (Prevnar 20)  Seen today for follow-up.  Blood pressure under good control, continue current medication Follow-up on A1c Check lipids, thyroid Update pneumonia, recommend COVID and flu shots this fall Refilled betamethasone ointment, asked her to apply a small amount to lesions on her left leg twice a day.  However, would also like her to see  dermatology for their opinion and she will schedule an appointment Will plan further follow- up pending labs. Inappropriate work Signed Abbe Amsterdam, MD  Received labs as below Results for orders placed or performed in visit on 07/16/23  CBC  Result Value Ref Range   WBC 6.8 4.0 - 10.5 K/uL   RBC 5.19 (H) 3.87 - 5.11 Mil/uL   Platelets 314.0 150.0 - 400.0 K/uL   Hemoglobin 13.6 12.0 - 15.0 g/dL   HCT 13.0 86.5 - 78.4 %   MCV 81.4 78.0 - 100.0 fl   MCHC 32.1 30.0 -  36.0 g/dL   RDW 16.1 09.6 - 04.5 %  Comprehensive metabolic panel  Result Value Ref Range   Sodium 139 135 - 145 mEq/L   Potassium 3.4 (L) 3.5 - 5.1 mEq/L   Chloride 100 96 - 112 mEq/L   CO2 30 19 - 32 mEq/L   Glucose, Bld 99 70 - 99 mg/dL   BUN 16 6 - 23 mg/dL   Creatinine, Ser 4.09 0.40 - 1.20 mg/dL   Total Bilirubin 0.6 0.2 - 1.2 mg/dL   Alkaline Phosphatase 69 39 - 117 U/L   AST 19 0 - 37 U/L   ALT 18 0 - 35 U/L   Total Protein 7.4 6.0 - 8.3 g/dL   Albumin 3.9 3.5 - 5.2 g/dL   GFR 81.19 >14.78 mL/min   Calcium 9.4 8.4 - 10.5 mg/dL  Hemoglobin G9F  Result Value Ref Range   Hgb A1c MFr Bld 6.7 (H) 4.6 - 6.5 %  Lipid panel  Result Value Ref Range   Cholesterol 196 0 - 200 mg/dL   Triglycerides 62.1 0.0 - 149.0 mg/dL   HDL 30.86 >57.84 mg/dL   VLDL 69.6 0.0 - 29.5 mg/dL   LDL Cholesterol 284 (H) 0 - 99 mg/dL   Total CHOL/HDL Ratio 3    NonHDL 137.53   TSH  Result Value Ref Range   TSH 1.97 0.35 - 5.50 uIU/mL

## 2023-07-14 ENCOUNTER — Other Ambulatory Visit: Payer: Self-pay | Admitting: Family Medicine

## 2023-07-14 DIAGNOSIS — I10 Essential (primary) hypertension: Secondary | ICD-10-CM

## 2023-07-16 ENCOUNTER — Ambulatory Visit (INDEPENDENT_AMBULATORY_CARE_PROVIDER_SITE_OTHER): Payer: Medicare Other | Admitting: Family Medicine

## 2023-07-16 ENCOUNTER — Encounter: Payer: Self-pay | Admitting: Family Medicine

## 2023-07-16 VITALS — BP 122/80 | HR 79 | Temp 98.0°F | Resp 18 | Ht 64.0 in | Wt 188.0 lb

## 2023-07-16 DIAGNOSIS — E119 Type 2 diabetes mellitus without complications: Secondary | ICD-10-CM | POA: Diagnosis not present

## 2023-07-16 DIAGNOSIS — L309 Dermatitis, unspecified: Secondary | ICD-10-CM

## 2023-07-16 DIAGNOSIS — Z13220 Encounter for screening for lipoid disorders: Secondary | ICD-10-CM | POA: Diagnosis not present

## 2023-07-16 DIAGNOSIS — I10 Essential (primary) hypertension: Secondary | ICD-10-CM

## 2023-07-16 DIAGNOSIS — Z1329 Encounter for screening for other suspected endocrine disorder: Secondary | ICD-10-CM

## 2023-07-16 DIAGNOSIS — Z23 Encounter for immunization: Secondary | ICD-10-CM

## 2023-07-16 LAB — CBC
HCT: 42.3 % (ref 36.0–46.0)
Hemoglobin: 13.6 g/dL (ref 12.0–15.0)
MCHC: 32.1 g/dL (ref 30.0–36.0)
MCV: 81.4 fl (ref 78.0–100.0)
Platelets: 314 10*3/uL (ref 150.0–400.0)
RBC: 5.19 Mil/uL — ABNORMAL HIGH (ref 3.87–5.11)
RDW: 14.6 % (ref 11.5–15.5)
WBC: 6.8 10*3/uL (ref 4.0–10.5)

## 2023-07-16 LAB — LIPID PANEL
Cholesterol: 196 mg/dL (ref 0–200)
HDL: 58.7 mg/dL (ref >39.00)
LDL Cholesterol: 120 mg/dL — ABNORMAL HIGH (ref 0–99)
NonHDL: 137.53
Total CHOL/HDL Ratio: 3
Triglycerides: 88 mg/dL (ref 0.0–149.0)
VLDL: 17.6 mg/dL (ref 0.0–40.0)

## 2023-07-16 LAB — TSH: TSH: 1.97 u[IU]/mL (ref 0.35–5.50)

## 2023-07-16 LAB — COMPREHENSIVE METABOLIC PANEL
ALT: 18 U/L (ref 0–35)
AST: 19 U/L (ref 0–37)
Albumin: 3.9 g/dL (ref 3.5–5.2)
Alkaline Phosphatase: 69 U/L (ref 39–117)
BUN: 16 mg/dL (ref 6–23)
CO2: 30 meq/L (ref 19–32)
Calcium: 9.4 mg/dL (ref 8.4–10.5)
Chloride: 100 mEq/L (ref 96–112)
Creatinine, Ser: 0.8 mg/dL (ref 0.40–1.20)
GFR: 73.32 mL/min (ref >60.00)
Glucose, Bld: 99 mg/dL (ref 70–99)
Potassium: 3.4 meq/L — ABNORMAL LOW (ref 3.5–5.1)
Sodium: 139 meq/L (ref 135–145)
Total Bilirubin: 0.6 mg/dL (ref 0.2–1.2)
Total Protein: 7.4 g/dL (ref 6.0–8.3)

## 2023-07-16 LAB — HEMOGLOBIN A1C: Hgb A1c MFr Bld: 6.7 % — ABNORMAL HIGH (ref 4.6–6.5)

## 2023-07-16 MED ORDER — BETAMETHASONE DIPROPIONATE 0.05 % EX OINT: TOPICAL_OINTMENT | Freq: Two times a day (BID) | CUTANEOUS | 3 refills | Status: AC

## 2023-08-16 LAB — HM MAMMOGRAPHY

## 2023-08-21 ENCOUNTER — Other Ambulatory Visit: Payer: Self-pay | Admitting: Family Medicine

## 2023-08-21 DIAGNOSIS — R0981 Nasal congestion: Secondary | ICD-10-CM

## 2023-12-01 NOTE — Progress Notes (Signed)
 Toni Williams at [facility] [address] [place], [place] [address] [phone]  Date:  12/05/2023   Name:  Toni Williams   DOB:  1951/05/24   MRN:  [id number]  PCP:  Watt Harlene BROCKS, MD    Chief Complaint: Leg Pain (R leg come and go x 6 months. Started in the foot and radiates up into the hip. Some better this week. No tx tried. )   History of Present Illness:  Toni Williams is a 73 y.o. very pleasant female patient who presents with the following:  Patient seen today with concern of pain in her leg-she thinks possibly sciatica Most recent visit with myself was in September  History of diet controlled pre-diabetes vs mild DM, HTN and breast cancer, eczema. 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  Eye exam- this is due  Foot exam- update today Flu shot- recommended   She has noticed pain in her right leg over the last couple of weeks- started in the ankle and then made it's way up to the back of her leg to the posterior thigh  No injury Left side is ok  No back pain She has had this in the past and it cleared up on its own She tried some shoe inserts which did help   She has not tried any medication as of yet for her pain  Would like to update labs today as well  Lab Results  Component Value Date   HGBA1C 6.8 (H) 12/05/2023     Patient Active Problem List   Diagnosis Date Noted   Controlled type 2 diabetes mellitus without complication, without long-term current use of insulin (HCC) 03/15/2021   Chest pain 05/17/2012   Abnormal resting ECG findings 04/24/2012   Vitamin D  deficiency 04/24/2012   Hypertension 02/11/2012   Breast cancer (HCC) 02/11/2012   Eczema  02/11/2012   Allergic rhinitis due to allergen 02/11/2012    Past Medical History:  Diagnosis Date   Anxiety    no meds   Blood transfusion 1979   with missed abortion in [place] -Dr Rosan   Cancer [facility])    Hypertension    Missed abortion    x 1   Seasonal allergies    Termination of pregnancy    x 1    Past Surgical History:  Procedure Laterality Date   BREAST SURGERY  10/03/2007, 11/06/2007   Left breast excision, re-excision Left breast   BREAST SURGERY     No IV sticks/Blood Draws/Blood Pressure Left Arm,   CESAREAN SECTION     CESAREAN SECTION N/A    Phreesia 04/30/2020   CHOLECYSTECTOMY     colonscopy     DILATION AND CURETTAGE OF UTERUS     HYSTEROSCOPY WITH D & C  10/27/2011   Procedure: DILATATION AND CURETTAGE /HYSTEROSCOPY;  Surgeon: Jon CINDERELLA Rummer, MD;  Location: [facility];  Service: Gynecology;  Laterality: N/A;   WISDOM TOOTH EXTRACTION      Social History   Tobacco Use   Smoking status: Former    Current packs/day: 0.00    Average packs/day: 0.3 packs/day for 2.0 years (0.5 ttl pk-yrs)  Types: Cigarettes    Start date: 10/30/1974    Quit date: 10/30/1976    Years since quitting: 47.1   Smokeless tobacco: Never  Substance Use Topics   Alcohol use: Yes    Comment: occasionally   Drug use: No    Family History  Problem Relation Age of Onset   Cancer Mother 8       breast (R),stomach, and uterine   Cancer Father 28       lung from smoking  3 pks/day   Diabetes Maternal Grandmother        amputation of (L) leg   Stroke Maternal Grandfather 60   Diabetes Brother     Allergies  Allergen Reactions   Morphine And Codeine Other (See Comments)    psychotic reaction per pt report   Shellfish Allergy Swelling   Latex Rash    itching    Medication list has been reviewed and updated.  Current Outpatient Medications on File Prior to Visit  Medication Sig Dispense Refill   betamethasone  dipropionate (DIPROLENE ) 0.05 % ointment Apply  topically 2 (two) times daily. Use as needed for eczema 60 g 3   fluticasone  (FLONASE ) 50 MCG/ACT nasal spray Place 1-2 sprays into both nostrils daily. 48 mL 1   hydrochlorothiazide  (HYDRODIURIL ) 25 MG tablet TAKE 1 TABLET (25 MG TOTAL) BY MOUTH DAILY. 90 tablet 1   hydrocortisone  2.5 % cream APPLY TOPICALLY 2 TIMES DAILY. APPLY LIGHTLY ON FACE IF NEEDED 28 g 3   No current facility-administered medications on file prior to visit.    Review of Systems:  As per HPI- otherwise negative.   Physical Examination: Vitals:   12/05/23 0939  BP: 124/78  Pulse: 75  Resp: 18  Temp: 98.2 F (36.8 C)  SpO2: 98%   Vitals:   12/05/23 0939  Weight: 189 lb 9.6 oz (86 kg)  Height: 5' 4 (1.626 m)   Body mass index is 32.54 kg/m. Ideal Body Weight: Weight in (lb) to have BMI = 25: 145.3  GEN: no acute distress.  Mild obesity, looks well  HEENT: Atraumatic, Normocephalic.  Ears and Nose: No external deformity. CV: RRR, No M/G/R. No JVD. No thrill. No extra heart sounds. PULM: CTA B, no wheezes, crackles, rhonchi. No retractions. No resp. distress. No accessory muscle use. ABD: S, NT, ND EXTR: No c/c/e PSYCH: Normally interactive. Conversant.  Thoracolumbar flexion extension is normal.  No tenderness of her paraspinous muscles or in the right buttock over the sciatic notch.  Normal right leg strength and sensation, negative straight leg raise.  No swelling or tenderness of the thigh or calf noted  Assessment and Plan: Right leg pain - Plan: US  Venous Img Lower Unilateral Right, methocarbamol  (ROBAXIN ) 500 MG tablet  Essential hypertension - Plan: Basic metabolic panel  Controlled type 2 diabetes mellitus without complication, without long-term current use of insulin (HCC) - Plan: Hemoglobin A1c, Basic metabolic panel, Microalbumin / creatinine urine ratio  Patient seen today with concern of right leg pain for a couple of weeks.  She wonders if this could be sciatica, but her more  detailed history and exam is not particularly suggest sciatica.  We will obtain an ultrasound to rule out a blood clot.  Will have her try Robaxin  for possible muscle pain, vascular let me know how this works for her Blood pressure is well-controlled on current regimen Update routine blood work today   Signed Harlene Schroeder, MD  Received results, message to patient  US  Venous Img  Lower Unilateral Right Result Date: 12/05/2023 CLINICAL DATA:  pain in right leg EXAM: RIGHT LOWER EXTREMITY VENOUS DOPPLER ULTRASOUND TECHNIQUE: Gray-scale sonography with compression, as well as color and duplex ultrasound, were performed to evaluate the deep venous system(s) from the level of the common femoral vein through the popliteal and proximal calf veins. COMPARISON:  Chest XR, 06/10/2020. FINDINGS: VENOUS Normal compressibility of the common femoral, superficial femoral, and popliteal veins, as well as the visualized calf veins. Visualized portions of profunda femoral vein and great saphenous vein unremarkable. No filling defects to suggest DVT on grayscale or color Doppler imaging. Doppler waveforms show normal direction of venous flow, normal respiratory plasticity and response to augmentation. Limited views of the contralateral common femoral vein are unremarkable. OTHER No evidence of superficial thrombophlebitis or abnormal fluid collection. Limitations: none IMPRESSION: No evidence of femoropopliteal DVT or superficial thrombophlebitis within the RIGHT lower extremity. Thom Hall, MD [facility]iology Electronically Signed   By: Thom Hall M.D.   On: 12/05/2023 12:09    Results for orders placed or performed in visit on 12/05/23  Hemoglobin A1c   Collection Time: 12/05/23 10:17 AM  Result Value Ref Range   Hgb A1c MFr Bld 6.8 (H) 4.6 - 6.5 %  Basic metabolic panel   Collection Time: 12/05/23 10:17 AM  Result Value Ref Range   Sodium 139 135 - 145  mEq/L   Potassium 4.0 3.5 - 5.1 mEq/L   Chloride 101 96 - 112 mEq/L   CO2 29 19 - 32 mEq/L   Glucose, Bld 111 (H) 70 - 99 mg/dL   BUN 14 6 - 23 mg/dL   Creatinine, Ser 9.24 0.40 - 1.20 mg/dL   GFR 20.98 >39.99 mL/min   Calcium 9.6 8.4 - 10.5 mg/dL  Microalbumin / creatinine urine ratio   Collection Time: 12/05/23 10:17 AM  Result Value Ref Range   Microalb, Ur 1.4 0.0 - 1.9 mg/dL   Creatinine,U 768.9 mg/dL   Microalb Creat Ratio 0.6 0.0 - 30.0 mg/g

## 2023-12-05 ENCOUNTER — Ambulatory Visit: Payer: Medicare Other | Admitting: Family Medicine

## 2023-12-05 ENCOUNTER — Encounter: Payer: Self-pay | Admitting: Family Medicine

## 2023-12-05 ENCOUNTER — Ambulatory Visit (HOSPITAL_BASED_OUTPATIENT_CLINIC_OR_DEPARTMENT_OTHER)
Admission: RE | Admit: 2023-12-05 | Discharge: 2023-12-05 | Disposition: A | Discharge status: Home / Self Care | Payer: Medicare Other | Source: Ambulatory Visit | Attending: Family Medicine | Admitting: Family Medicine

## 2023-12-05 VITALS — BP 124/78 | HR 75 | Temp 98.2°F | Resp 18 | Ht 64.0 in | Wt 189.6 lb

## 2023-12-05 DIAGNOSIS — M79604 Pain in right leg: Secondary | ICD-10-CM | POA: Insufficient documentation

## 2023-12-05 DIAGNOSIS — E119 Type 2 diabetes mellitus without complications: Secondary | ICD-10-CM | POA: Diagnosis not present

## 2023-12-05 DIAGNOSIS — I10 Essential (primary) hypertension: Secondary | ICD-10-CM

## 2023-12-05 LAB — BASIC METABOLIC PANEL
BUN: 14 mg/dL (ref 6–23)
CO2: 29 meq/L (ref 19–32)
Calcium: 9.6 mg/dL (ref 8.4–10.5)
Chloride: 101 meq/L (ref 96–112)
Creatinine, Ser: 0.75 mg/dL (ref 0.40–1.20)
GFR: 79.01 mL/min (ref >60.00)
Glucose, Bld: 111 mg/dL — ABNORMAL HIGH (ref 70–99)
Potassium: 4 meq/L (ref 3.5–5.1)
Sodium: 139 meq/L (ref 135–145)

## 2023-12-05 LAB — MICROALBUMIN / CREATININE URINE RATIO
Creatinine,U: 231 mg/dL
Microalb Creat Ratio: 0.6 mg/g (ref 0.0–30.0)
Microalb, Ur: 1.4 mg/dL (ref 0.0–1.9)

## 2023-12-05 LAB — HEMOGLOBIN A1C: Hgb A1c MFr Bld: 6.8 % — ABNORMAL HIGH (ref 4.6–6.5)

## 2023-12-05 MED ORDER — METHOCARBAMOL 500 MG PO TABS: 500.0000 mg | ORAL_TABLET | Freq: Three times a day (TID) | ORAL | 0 refills | Status: AC | PRN

## 2023-12-05 NOTE — Patient Instructions (Signed)
 It was good to see you today!  Please stop at lab and then go to the ground floor imaging dept to set up your ultrasound Assuming there is no sign of a blood clot I suspect your leg will get better- will try methocarbamol  as needed, this can make you drowsy!  Please let me know if not seeing improvement in a week or so- Sooner if worse.     Recommend flu shot Recommend eye exam

## 2023-12-06 ENCOUNTER — Telehealth: Payer: Self-pay | Admitting: Family Medicine

## 2023-12-06 NOTE — Telephone Encounter (Signed)
 Copied from CRM [id number]. Topic: Medicare AWV >> Dec 06, 2023  9:00 AM Nathanel DEL wrote: Reason for CRM: Called LVM 12/05/2023 to schedule AWV. Please schedule Virtual or Telehealth visits ONLY.   Nathanel Paschal; Care Guide Ambulatory Clinical Support [place] [facility] Direct Dial: [phone]

## 2024-01-14 ENCOUNTER — Ambulatory Visit: Payer: Medicare Other | Admitting: Family Medicine

## 2024-01-20 ENCOUNTER — Other Ambulatory Visit: Payer: Self-pay | Admitting: Family Medicine

## 2024-01-20 DIAGNOSIS — I10 Essential (primary) hypertension: Secondary | ICD-10-CM

## 2024-02-28 ENCOUNTER — Telehealth: Payer: Self-pay | Admitting: Family Medicine

## 2024-02-28 NOTE — Telephone Encounter (Signed)
 Copied from CRM [id number]. Topic: Medicare AWV >> Feb 28, 2024 10:46 AM Juliana Ocean wrote: Reason for CRM: LVM 02/28/2024 to schedule AWV. Please schedule Virtual or Telehealth visits ONLY.   Rosalee Collins; Care Guide Ambulatory Clinical Support [place] [facility] Direct Dial: [phone]

## 2024-05-26 ENCOUNTER — Other Ambulatory Visit: Payer: Self-pay | Admitting: Family Medicine

## 2024-06-24 ENCOUNTER — Ambulatory Visit (INDEPENDENT_AMBULATORY_CARE_PROVIDER_SITE_OTHER)

## 2024-06-24 VITALS — Ht 64.0 in | Wt 189.0 lb

## 2024-06-24 DIAGNOSIS — Z0000 Encounter for general adult medical examination without abnormal findings: Secondary | ICD-10-CM

## 2024-06-24 NOTE — Progress Notes (Signed)
 Subjective:   Toni Williams is a 73 y.o. who presents for a Medicare Wellness preventive visit.  As a reminder, Annual Wellness Visits don't include a physical exam, and some assessments may be limited, especially if this visit is performed virtually. We may recommend an in-person follow-up visit with your provider if needed.  Visit Complete: Virtual I connected with  Arlyne SHAUNNA Forte on 06/24/24 by a audio enabled telemedicine application and verified that I am speaking with the correct person using two identifiers.  Patient Location: Home  Provider Location: Home Office  I discussed the limitations of evaluation and management by telemedicine. The patient expressed understanding and agreed to proceed.  Vital Signs: Because this visit was a virtual/telehealth visit, some criteria may be missing or patient reported. Any vitals not documented were not able to be obtained and vitals that have been documented are patient reported.    Persons Participating in Visit: Patient.  AWV Questionnaire: No: Patient Medicare AWV questionnaire was not completed prior to this visit.  Cardiac Risk Factors include: advanced age (>72men, >56 women);diabetes mellitus;hypertension     Objective:    Today's Vitals   06/24/24 1514  Weight: 189 lb (85.7 kg)  Height: 5' 4 (1.626 m)   Body mass index is 32.44 kg/m.     06/24/2024    3:20 PM 12/15/2022    9:02 AM 09/29/2021    2:25 PM 06/07/2020    8:19 AM 05/03/2020   11:31 AM 06/05/2019    3:50 PM 02/12/2015    9:25 AM  Advanced Directives  Does Patient Have a Medical Advance Directive? No No No No No No No   Would patient like information on creating a medical advance directive? No - Patient declined No - Patient declined No - Patient declined Yes (ED - Information included in AVS)  Yes (MAU/Ambulatory/Procedural Areas - Information given)  No - patient declined information      Data saved with a previous flowsheet row definition     Current Medications (verified) Outpatient Encounter Medications as of 06/24/2024  Medication Sig   betamethasone  dipropionate (DIPROLENE ) 0.05 % ointment Apply topically 2 (two) times daily. Use as needed for eczema   fluticasone  (FLONASE ) 50 MCG/ACT nasal spray Place 1-2 sprays into both nostrils daily.   hydrochlorothiazide  (HYDRODIURIL ) 25 MG tablet TAKE 1 TABLET (25 MG TOTAL) BY MOUTH DAILY.   hydrocortisone  2.5 % cream APPLY TOPICALLY 2 TIMES DAILY. APPLY LIGHTLY ON FACE IF NEEDED   methocarbamol  (ROBAXIN ) 500 MG tablet Take 1 tablet (500 mg total) by mouth every 8 (eight) hours as needed for muscle spasms.   No facility-administered encounter medications on file as of 06/24/2024.    Allergies (verified) Morphine and codeine, Shellfish allergy, and Latex   History: Past Medical History:  Diagnosis Date   Anxiety    no meds   Blood transfusion 1979   with missed abortion in [place] -Dr Rosan   Cancer [facility])    Hypertension    Missed abortion    x 1   Seasonal allergies    Termination of pregnancy    x 1   Past Surgical History:  Procedure Laterality Date   BREAST SURGERY  10/03/2007, 11/06/2007   Left breast excision, re-excision Left breast   BREAST SURGERY     No IV sticks/Blood Draws/Blood Pressure Left Arm,   CESAREAN SECTION     CESAREAN SECTION N/A    Phreesia 04/30/2020   CHOLECYSTECTOMY     colonscopy  DILATION AND CURETTAGE OF UTERUS     HYSTEROSCOPY WITH D & C  10/27/2011   Procedure: DILATATION AND CURETTAGE /HYSTEROSCOPY;  Surgeon: Jon CINDERELLA Rummer, MD;  Location: [facility];  Service: Gynecology;  Laterality: N/A;   WISDOM TOOTH EXTRACTION     Family History  Problem Relation Age of Onset   Cancer Mother 66       breast (R),stomach, and uterine   Cancer Father 2       lung from smoking  3 pks/day   Diabetes Maternal Grandmother        amputation of (L) leg   Stroke Maternal Grandfather 47   Diabetes Brother    Social History    Socioeconomic History   Marital status: Married    Spouse name: Not on file   Number of children: Not on file   Years of education: Not on file   Highest education level: Some college, no degree  Occupational History   Not on file  Tobacco Use   Smoking status: Former    Current packs/day: 0.00    Average packs/day: 0.3 packs/day for 2.0 years (0.5 ttl pk-yrs)    Types: Cigarettes    Start date: 10/30/1974    Quit date: 10/30/1976    Years since quitting: 47.6   Smokeless tobacco: Never  Substance and Sexual Activity   Alcohol use: Yes    Comment: occasionally   Drug use: No   Sexual activity: Not Currently    Birth control/protection: None  Other Topics Concern   Not on file  Social History Narrative   Not on file   Social Drivers of Health   Financial Resource Strain: Low Risk  (06/24/2024)   Overall Financial Resource Strain (CARDIA)    Difficulty of Paying Living Expenses: Not hard at all  Food Insecurity: No Food Insecurity (06/24/2024)   Hunger Vital Sign    Worried About Running Out of Food in the Last Year: Never true    Ran Out of Food in the Last Year: Never true  Transportation Needs: No Transportation Needs (06/24/2024)   PRAPARE - Administrator, Civil Service (Medical): No    Lack of Transportation (Non-Medical): No  Physical Activity: Sufficiently Active (06/24/2024)   Exercise Vital Sign    Days of Exercise per Week: 5 days    Minutes of Exercise per Session: 30 min  Stress: No Stress Concern Present (06/24/2024)   [employer] of Occupational Health - Occupational Stress Questionnaire    Feeling of Stress: Not at all  Social Connections: Socially Integrated (06/24/2024)   Social Connection and Isolation Panel    Frequency of Communication with Friends and Family: More than three times a week    Frequency of Social Gatherings with Friends and Family: More than three times a week    Attends Religious Services: More than 4 times per year     Active Member of [employer] or Organizations: Yes    Attends Engineer, structural: More than 4 times per year    Marital Status: Married    Tobacco Counseling Counseling given: Not Answered    Clinical Intake:  Pre-visit preparation completed: Yes  Pain : No/denies pain     BMI - recorded: 32.44 Nutritional Risks: None Diabetes: Yes CBG done?: No Did pt. bring in CBG monitor from home?: No  Lab Results  Component Value Date   HGBA1C 6.8 (H) 12/05/2023   HGBA1C 6.7 (H) 07/16/2023   HGBA1C 6.8 (H) 09/13/2022  How often do you need to have someone help you when you read instructions, pamphlets, or other written materials from your doctor or pharmacy?: 1 - Never  Interpreter Needed?: No  Information entered by :: Rojelio Blush LPN   Activities of Daily Living     06/24/2024    3:19 PM  In your present state of health, do you have any difficulty performing the following activities:  Hearing? 0  Vision? 0  Difficulty concentrating or making decisions? 0  Walking or climbing stairs? 0  Dressing or bathing? 0  Doing errands, shopping? 0  Preparing Food and eating ? N  Using the Toilet? N  In the past six months, have you accidently leaked urine? N  Do you have problems with loss of bowel control? N  Managing your Medications? N  Managing your Finances? N  Housekeeping or managing your Housekeeping? N    Patient Care Team: Copland, Harlene BROCKS, MD as PCP - General (Family Medicine) Shlomo Wilbert SAUNDERS, MD as PCP - Cardiology (Cardiology) Wonda Cy BROCKS, RD as Dietitian (Family Medicine) Robinson Pao, MD as Consulting Physician (Dermatology) Henry Slough, MD as Consulting Physician (Obstetrics and Gynecology)  I have updated your Care Teams any recent Medical Services you may have received from other providers in the past year.     Assessment:   This is a routine wellness examination for Cable.  Hearing/Vision screen Hearing Screening - Comments::  Denies hearing difficulties   Vision Screening - Comments:: Wears rx glasses - up to date with routine eye exams with  [facility]   Goals Addressed               This Visit's Progress     Increase physical activity (pt-stated)        Lose weight.       Depression Screen     06/24/2024    3:17 PM 12/15/2022    9:03 AM 09/13/2022   11:07 AM 09/29/2021    2:28 PM 07/18/2021   10:43 AM 06/10/2020   10:43 AM 06/07/2020    8:25 AM  PHQ 2/9 Scores  PHQ - 2 Score 0 0 0 0 0 0 0  Exception Documentation     Medical reason      Fall Risk     06/24/2024    3:19 PM 12/14/2022    1:32 PM 09/13/2022   11:07 AM 09/29/2021    2:26 PM 07/18/2021   10:43 AM  Fall Risk   Falls in the past year? 0 0 0 0 0  Number falls in past yr: 0 0 0 0 0  Injury with Fall? 0 0  0 0  Risk for fall due to : No Fall Risks No Fall Risks     Follow up Falls evaluation completed Falls evaluation completed  Falls prevention discussed       Data saved with a previous flowsheet row definition    MEDICARE RISK AT HOME:  Medicare Risk at Home Any stairs in or around the home?: Yes If so, are there any without handrails?: No Home free of loose throw rugs in walkways, pet beds, electrical cords, etc?: Yes Adequate lighting in your home to reduce risk of falls?: Yes Life alert?: No Use of a cane, walker or w/c?: No Grab bars in the bathroom?: No Shower chair or bench in shower?: No Elevated toilet seat or a handicapped toilet?: No  TIMED UP AND GO:  Was the test performed?  No  Cognitive Function: 6CIT completed        06/24/2024    3:20 PM 12/15/2022    9:06 AM 06/07/2020    8:19 AM 06/05/2019    3:49 PM  6CIT Screen  What Year? 0 points 0 points 0 points 0 points  What month? 0 points 0 points 0 points 0 points  What time? 0 points 0 points 0 points 0 points  Count back from 20 0 points 0 points 0 points 0 points  Months in reverse 0 points 0 points 0 points 0 points  Repeat phrase 0  points 0 points 0 points 0 points  Total Score 0 points 0 points 0 points 0 points    Immunizations Immunization History  Administered Date(s) Administered   PFIZER(Purple Top)SARS-COV-2 Vaccination 11/17/2018, 12/09/2019, 08/06/2020   PNEUMOCOCCAL CONJUGATE-20 07/16/2023   Pneumococcal Conjugate-13 07/13/2020   Td 01/18/2005   Tdap 07/18/2021    Screening Tests Health Maintenance  Topic Date Due   Diabetic kidney evaluation - Urine ACR  Never done   COVID-19 Vaccine (4 - 2024-25 season) 07/01/2023   OPHTHALMOLOGY EXAM  07/30/2023   HEMOGLOBIN A1C  06/03/2024   INFLUENZA VACCINE  01/28/2028 (Originally 05/30/2024)   Diabetic kidney evaluation - eGFR measurement  12/04/2024   FOOT EXAM  12/04/2024   Medicare Annual Wellness (AWV)  06/24/2025   MAMMOGRAM  08/15/2025   Fecal DNA (Cologuard)  06/27/2026   DTaP/Tdap/Td (3 - Td or Tdap) 07/19/2031   Pneumococcal Vaccine: 50+ Years  Completed   DEXA SCAN  Completed   Hepatitis C Screening  Completed   HPV VACCINES  Aged Out   Meningococcal B Vaccine  Aged Out   Zoster Vaccines- Shingrix  Discontinued    Health Maintenance  Health Maintenance Due  Topic Date Due   Diabetic kidney evaluation - Urine ACR  Never done   COVID-19 Vaccine (4 - 2024-25 season) 07/01/2023   OPHTHALMOLOGY EXAM  07/30/2023   HEMOGLOBIN A1C  06/03/2024   Health Maintenance Items Addressed:   Additional Screening:  Vision Screening: Recommended annual ophthalmology exams for early detection of glaucoma and other disorders of the eye. Would you like a referral to an eye doctor? No    Dental Screening: Recommended annual dental exams for proper oral hygiene  Community Resource Referral / Chronic Care Management: CRR required this visit?  No   CCM required this visit?  No   Plan:    I have personally reviewed and noted the following in the patient's chart:   Medical and social history Use of alcohol, tobacco or illicit drugs  Current  medications and supplements including opioid prescriptions. Patient is not currently taking opioid prescriptions. Functional ability and status Nutritional status Physical activity Advanced directives List of other physicians Hospitalizations, surgeries, and ER visits in previous 12 months Vitals Screenings to include cognitive, depression, and falls Referrals and appointments  In addition, I have reviewed and discussed with patient certain preventive protocols, quality metrics, and best practice recommendations. A written personalized care plan for preventive services as well as general preventive health recommendations were provided to patient.   Rojelio LELON Blush, LPN   [id number]/[id number]   After Visit Summary: (MyChart) Due to this being a telephonic visit, the after visit summary with patients personalized plan was offered to patient via MyChart   Notes: Nothing significant to report at this time.

## 2024-06-24 NOTE — Patient Instructions (Addendum)
 Ms. Toni Williams , Thank you for taking time out of your busy schedule to complete your Annual Wellness Visit with me. I enjoyed our conversation and look forward to speaking with you again next year. I, as well as your care team,  appreciate your ongoing commitment to your health goals. Please review the following plan we discussed and let me know if I can assist you in the future. Your Game plan/ To Do List    Referrals: If you haven't heard from the office you've been referred to, please reach out to them at the phone provided.   Follow up Visits: We will see or speak with you next year for your Next Medicare AWV with our clinical staff 06/30/25 @ 3:10p  Have you seen your provider in the last 6 months (3 months if uncontrolled diabetes)?   Clinician Recommendations:  Aim for 30 minutes of exercise or brisk walking, 6-8 glasses of water, and 5 servings of fruits and vegetables each day.       This is a list of the screenings recommended for you:  Health Maintenance  Topic Date Due   Yearly kidney health urinalysis for diabetes  Never done   COVID-19 Vaccine (4 - 2024-25 season) 07/01/2023   Eye exam for diabetics  07/30/2023   Hemoglobin A1C  06/03/2024   Flu Shot  01/28/2028*   Yearly kidney function blood test for diabetes  12/04/2024   Complete foot exam   12/04/2024   Medicare Annual Wellness Visit  06/24/2025   Mammogram  08/15/2025   Cologuard (Stool DNA test)  06/27/2026   DTaP/Tdap/Td vaccine (3 - Td or Tdap) 07/19/2031   Pneumococcal Vaccine for age over 44  Completed   DEXA scan (bone density measurement)  Completed   Hepatitis C Screening  Completed   HPV Vaccine  Aged Out   Meningitis B Vaccine  Aged Out   Zoster (Shingles) Vaccine  Discontinued  *Topic was postponed. The date shown is not the original due date.    Advanced directives: (Declined) Advance directive discussed with you today. Even though you declined this today, please call our office should you change your  mind, and we can give you the proper paperwork for you to fill out. Advance Care Planning is important because it:  [x]  Makes sure you receive the medical care that is consistent with your values, goals, and preferences  [x]  It provides guidance to your family and loved ones and reduces their decisional burden about whether or not they are making the right decisions based on your wishes.  Follow the link provided in your after visit summary or read over the paperwork we have mailed to you to help you started getting your Advance Directives in place. If you need assistance in completing these, please reach out to us  so that we can help you!  See attachments for Preventive Care and Fall Prevention Tips.

## 2024-06-28 ENCOUNTER — Other Ambulatory Visit: Payer: Self-pay | Admitting: Family Medicine

## 2024-06-28 DIAGNOSIS — R0981 Nasal congestion: Secondary | ICD-10-CM

## 2024-07-25 NOTE — Progress Notes (Signed)
 Toni Williams                                          MRN: [id number]   07/25/2024   The [facility]I Quality Team Specialist reviewed this patient medical record for the purposes of chart review for care gap closure. The following were reviewed: chart review for care gap closure-diabetic eye exam. No eye exam found    VBCI Quality Team

## 2024-07-27 ENCOUNTER — Other Ambulatory Visit: Payer: Self-pay | Admitting: Family Medicine

## 2024-07-27 DIAGNOSIS — I10 Essential (primary) hypertension: Secondary | ICD-10-CM

## 2024-07-28 ENCOUNTER — Encounter: Payer: Self-pay | Admitting: *Deleted

## 2024-08-16 NOTE — Patient Instructions (Signed)
 Recommend covid booster, one dose RSV, Shingrix series and flu shot this fall Please call [facility] and ask if you are due for a bone density- I ordered if needed!  I would also encourage you to get a CT coronary calcium- heart test. I am glad to order for you!   Take care

## 2024-08-16 NOTE — Progress Notes (Unsigned)
 [facility] [address], [address] [place], [place] [address] [phone]  Date:  08/21/2024   Name:  Toni Williams   DOB:  04/09/1951   MRN:  [id number]  PCP:  Watt Harlene BROCKS, MD    Chief Complaint: No chief complaint on file.   History of Present Illness:  Toni Williams is a 73 y.o. very pleasant female patient who presents with the following:  Pt seen today for CPE Last seen by me in February  History of diet controlled pre-diabetes vs mild DM, HTN and breast cancer, eczema. 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  History of Present Illness    Patient Active Problem List   Diagnosis Date Noted   Controlled type 2 diabetes mellitus without complication, without long-term current use of insulin (HCC) 03/15/2021   Chest pain 05/17/2012   Abnormal resting ECG findings 04/24/2012   Vitamin D  deficiency 04/24/2012   Hypertension 02/11/2012   Breast cancer (HCC) 02/11/2012   Eczema 02/11/2012   Allergic rhinitis due to allergen 02/11/2012    Past Medical History:  Diagnosis Date   Anxiety    no meds   Blood transfusion 1979   with missed abortion in [place] -Dr Rosan   Cancer [facility])    Hypertension    Missed abortion    x 1   Seasonal allergies    Termination of pregnancy    x 1    Past Surgical History:  Procedure Laterality Date   BREAST SURGERY  10/03/2007, 11/06/2007   Left breast excision, re-excision Left breast   BREAST  SURGERY     No IV sticks/Blood Draws/Blood Pressure Left Arm,   CESAREAN SECTION     CESAREAN SECTION N/A    Phreesia 04/30/2020   CHOLECYSTECTOMY     colonscopy     DILATION AND CURETTAGE OF UTERUS     HYSTEROSCOPY WITH D & C  10/27/2011   Procedure: DILATATION AND CURETTAGE /HYSTEROSCOPY;  Surgeon: Jon CINDERELLA Rummer, MD;  Location: [facility];  Service: Gynecology;  Laterality: N/A;   WISDOM TOOTH EXTRACTION      Social History   Tobacco Use   Smoking status: Former    Current packs/day: 0.00    Average packs/day: 0.3 packs/day for 2.0 years (0.5 ttl pk-yrs)    Types: Cigarettes    Start date: 10/30/1974    Quit date: 10/30/1976    Years since quitting: 47.8   Smokeless tobacco: Never  Substance Use Topics   Alcohol use: Yes    Comment: occasionally   Drug use: No    Family History  Problem Relation Age of Onset   Cancer Mother 24       breast (R),stomach, and uterine   Cancer Father 60       lung from smoking  3 pks/day   Diabetes Maternal Grandmother        amputation of (L) leg   Stroke Maternal  Grandfather 60   Diabetes Brother     Allergies  Allergen Reactions   Morphine And Codeine Other (See Comments)    psychotic reaction per pt report   Shellfish Allergy Swelling   Latex Rash    itching    Medication list has been reviewed and updated.  Current Outpatient Medications on File Prior to Visit  Medication Sig Dispense Refill   betamethasone  dipropionate (DIPROLENE ) 0.05 % ointment Apply topically 2 (two) times daily. Use as needed for eczema 60 g 3   fluticasone  (FLONASE ) 50 MCG/ACT nasal spray PLACE 1-2 SPRAYS INTO BOTH NOSTRILS DAILY. 48 mL 1   hydrochlorothiazide  (HYDRODIURIL ) 25 MG tablet TAKE 1 TABLET (25 MG TOTAL) BY MOUTH DAILY. 90 tablet 0   hydrocortisone  2.5 % cream APPLY TOPICALLY 2 TIMES DAILY. APPLY LIGHTLY ON FACE IF NEEDED 28 g 3   methocarbamol  (ROBAXIN ) 500 MG tablet Take 1 tablet (500 mg total) by mouth every 8 (eight) hours as needed for  muscle spasms. 30 tablet 0   No current facility-administered medications on file prior to visit.    Review of Systems:  As per HPI- otherwise negative.   Physical Examination: There were no vitals filed for this visit. There were no vitals filed for this visit. There is no height or weight on file to calculate BMI. Ideal Body Weight:    GEN: no acute distress. HEENT: Atraumatic, Normocephalic.  Ears and Nose: No external deformity. CV: RRR, No M/G/R. No JVD. No thrill. No extra heart sounds. PULM: CTA B, no wheezes, crackles, rhonchi. No retractions. No resp. distress. No accessory muscle use. ABD: S, NT, ND, +BS. No rebound. No HSM. EXTR: No c/c/e PSYCH: Normally interactive. Conversant.    Assessment and Plan: No diagnosis found.  Assessment & Plan   Signed Harlene Schroeder, MD

## 2024-08-21 ENCOUNTER — Encounter: Payer: Self-pay | Admitting: Family Medicine

## 2024-08-21 ENCOUNTER — Ambulatory Visit (INDEPENDENT_AMBULATORY_CARE_PROVIDER_SITE_OTHER): Admitting: Family Medicine

## 2024-08-21 VITALS — BP 138/85 | HR 89 | Temp 97.7°F | Ht 64.0 in | Wt 184.6 lb

## 2024-08-21 DIAGNOSIS — E2839 Other primary ovarian failure: Secondary | ICD-10-CM

## 2024-08-21 DIAGNOSIS — E119 Type 2 diabetes mellitus without complications: Secondary | ICD-10-CM | POA: Diagnosis not present

## 2024-08-21 DIAGNOSIS — Z13220 Encounter for screening for lipoid disorders: Secondary | ICD-10-CM

## 2024-08-21 DIAGNOSIS — Z0000 Encounter for general adult medical examination without abnormal findings: Secondary | ICD-10-CM

## 2024-08-21 DIAGNOSIS — I10 Essential (primary) hypertension: Secondary | ICD-10-CM | POA: Diagnosis not present

## 2024-08-21 DIAGNOSIS — Z1329 Encounter for screening for other suspected endocrine disorder: Secondary | ICD-10-CM

## 2024-08-21 DIAGNOSIS — L309 Dermatitis, unspecified: Secondary | ICD-10-CM

## 2024-08-21 DIAGNOSIS — Z130 Encounter for screening for diseases of the blood and blood-forming organs and certain disorders involving the immune mechanism: Secondary | ICD-10-CM | POA: Diagnosis not present

## 2024-08-21 LAB — COMPREHENSIVE METABOLIC PANEL WITH GFR
ALT: 15 U/L (ref 0–35)
AST: 18 U/L (ref 0–37)
Albumin: 4 g/dL (ref 3.5–5.2)
Alkaline Phosphatase: 66 U/L (ref 39–117)
BUN: 13 mg/dL (ref 6–23)
CO2: 30 meq/L (ref 19–32)
Calcium: 9.6 mg/dL (ref 8.4–10.5)
Chloride: 101 meq/L (ref 96–112)
Creatinine, Ser: 0.77 mg/dL (ref 0.40–1.20)
GFR: 76.17 mL/min (ref >60.00)
Glucose, Bld: 103 mg/dL — ABNORMAL HIGH (ref 70–99)
Potassium: 3.7 meq/L (ref 3.5–5.1)
Sodium: 140 meq/L (ref 135–145)
Total Bilirubin: 0.9 mg/dL (ref 0.2–1.2)
Total Protein: 7.3 g/dL (ref 6.0–8.3)

## 2024-08-21 LAB — CBC
HCT: 40.5 % (ref 36.0–46.0)
Hemoglobin: 13.1 g/dL (ref 12.0–15.0)
MCHC: 32.4 g/dL (ref 30.0–36.0)
MCV: 80 fl (ref 78.0–100.0)
Platelets: 296 K/uL (ref 150.0–400.0)
RBC: 5.06 Mil/uL (ref 3.87–5.11)
RDW: 14.2 % (ref 11.5–15.5)
WBC: 6.8 K/uL (ref 4.0–10.5)

## 2024-08-21 LAB — TSH: TSH: 2.14 u[IU]/mL (ref 0.35–5.50)

## 2024-08-21 LAB — MICROALBUMIN / CREATININE URINE RATIO
Creatinine,U: 173.5 mg/dL
Microalb Creat Ratio: 5.8 mg/g (ref 0.0–30.0)
Microalb, Ur: 1 mg/dL (ref 0.0–1.9)

## 2024-08-21 LAB — LIPID PANEL
Cholesterol: 193 mg/dL (ref 0–200)
HDL: 54.6 mg/dL (ref >39.00)
LDL Cholesterol: 119 mg/dL — ABNORMAL HIGH (ref 0–99)
NonHDL: 138.65
Total CHOL/HDL Ratio: 4
Triglycerides: 100 mg/dL (ref 0.0–149.0)
VLDL: 20 mg/dL (ref 0.0–40.0)

## 2024-08-21 LAB — HEMOGLOBIN A1C: Hgb A1c MFr Bld: 6.7 % — ABNORMAL HIGH (ref 4.6–6.5)

## 2024-08-21 MED ORDER — HYDROCHLOROTHIAZIDE 25 MG PO TABS
25.0000 mg | ORAL_TABLET | Freq: Every day | ORAL | 3 refills | Status: AC
Start: 2024-08-21 — End: ?

## 2024-08-28 LAB — HM MAMMOGRAPHY

## 2024-10-16 LAB — HM DEXA SCAN: HM Dexa Scan: NORMAL

## 2024-10-29 ENCOUNTER — Encounter: Payer: Self-pay | Admitting: Family Medicine

## 2025-04-02 ENCOUNTER — Ambulatory Visit: Admitting: Dermatology

## 2025-06-30 ENCOUNTER — Ambulatory Visit
